# Patient Record
Sex: Male | Born: 1945 | Race: White | Hispanic: No | Marital: Married | Smoking: Never smoker
Health system: Southern US, Community
[De-identification: ages and names within clinical notes are randomized; demographics above are authoritative.]

## PROBLEM LIST (undated history)

## (undated) DIAGNOSIS — H903 Sensorineural hearing loss, bilateral: Secondary | ICD-10-CM

## (undated) DIAGNOSIS — M48061 Spinal stenosis, lumbar region without neurogenic claudication: Secondary | ICD-10-CM

## (undated) DIAGNOSIS — C44320 Squamous cell carcinoma of skin of unspecified parts of face: Secondary | ICD-10-CM

## (undated) DIAGNOSIS — Z5189 Encounter for other specified aftercare: Secondary | ICD-10-CM

## (undated) DIAGNOSIS — M4802 Spinal stenosis, cervical region: Secondary | ICD-10-CM

## (undated) DIAGNOSIS — M5412 Radiculopathy, cervical region: Secondary | ICD-10-CM

## (undated) HISTORY — PX: SHOULDER SURGERY: SHX246

## (undated) HISTORY — PX: CERVICAL DISC SURGERY: SHX588

## (undated) HISTORY — DX: Radiculopathy, cervical region: M54.12

## (undated) HISTORY — DX: Sensorineural hearing loss, bilateral: H90.3

## (undated) HISTORY — DX: Encounter for other specified aftercare: Z51.89

## (undated) HISTORY — DX: Squamous cell carcinoma of skin of unspecified parts of face: C44.320

## (undated) HISTORY — PX: LUMBAR SPINE SURGERY: SHX701

## (undated) HISTORY — DX: Spinal stenosis, lumbar region without neurogenic claudication: M48.061

## (undated) HISTORY — PX: COLONOSCOPY: SHX174

## (undated) HISTORY — PX: OTHER SURGICAL HISTORY: SHX169

## (undated) HISTORY — PX: ANKLE SURGERY: SHX546

## (undated) HISTORY — DX: Spinal stenosis, cervical region: M48.02

---

## 2000-10-24 ENCOUNTER — Encounter: Admission: RE | Admit: 2000-10-24 | Discharge: 2000-10-24 | Payer: Self-pay | Admitting: Sports Medicine

## 2001-03-09 ENCOUNTER — Encounter: Admission: RE | Admit: 2001-03-09 | Discharge: 2001-03-09 | Payer: Self-pay | Admitting: Family Medicine

## 2001-03-31 ENCOUNTER — Encounter: Admission: RE | Admit: 2001-03-31 | Discharge: 2001-04-27 | Payer: Self-pay | Admitting: Sports Medicine

## 2005-06-07 ENCOUNTER — Ambulatory Visit: Payer: Self-pay | Admitting: Internal Medicine

## 2005-08-02 ENCOUNTER — Ambulatory Visit: Payer: Self-pay | Admitting: Internal Medicine

## 2005-08-13 ENCOUNTER — Ambulatory Visit: Payer: Self-pay | Admitting: Gastroenterology

## 2005-09-06 ENCOUNTER — Ambulatory Visit: Payer: Self-pay | Admitting: Gastroenterology

## 2005-09-06 ENCOUNTER — Encounter: Payer: Self-pay | Admitting: Internal Medicine

## 2007-08-21 ENCOUNTER — Encounter: Payer: Self-pay | Admitting: Internal Medicine

## 2007-11-19 ENCOUNTER — Encounter: Payer: Self-pay | Admitting: Internal Medicine

## 2008-03-08 ENCOUNTER — Ambulatory Visit: Payer: Self-pay | Admitting: Internal Medicine

## 2008-03-10 ENCOUNTER — Ambulatory Visit: Payer: Self-pay | Admitting: Internal Medicine

## 2008-03-10 LAB — CONVERTED CEMR LAB
Bilirubin Urine: NEGATIVE
Blood in Urine, dipstick: NEGATIVE
Glucose, Urine, Semiquant: NEGATIVE
Ketones, urine, test strip: NEGATIVE
Nitrite: NEGATIVE
Protein, U semiquant: 30
Specific Gravity, Urine: >=1.03
Urobilinogen, UA: NEGATIVE
WBC Urine, dipstick: NEGATIVE
pH: 5

## 2008-06-27 ENCOUNTER — Ambulatory Visit: Payer: Self-pay | Admitting: Internal Medicine

## 2008-10-27 ENCOUNTER — Ambulatory Visit: Payer: Self-pay | Admitting: Internal Medicine

## 2008-10-27 DIAGNOSIS — M542 Cervicalgia: Secondary | ICD-10-CM | POA: Insufficient documentation

## 2009-09-06 ENCOUNTER — Ambulatory Visit: Payer: Self-pay | Admitting: Internal Medicine

## 2009-09-06 LAB — CONVERTED CEMR LAB
ALT: 36 units/L (ref 0–53)
AST: 35 units/L (ref 0–37)
Albumin: 4.2 g/dL (ref 3.5–5.2)
Alkaline Phosphatase: 53 units/L (ref 39–117)
BUN: 14 mg/dL (ref 6–23)
Basophils Absolute: 0 10*3/uL (ref 0.0–0.1)
Basophils Relative: 0.2 % (ref 0.0–3.0)
Bilirubin Urine: NEGATIVE
Bilirubin, Direct: 0.1 mg/dL (ref 0.0–0.3)
Blood in Urine, dipstick: NEGATIVE
CO2: 31 meq/L (ref 19–32)
Calcium: 9.3 mg/dL (ref 8.4–10.5)
Chloride: 106 meq/L (ref 96–112)
Cholesterol: 198 mg/dL (ref 0–200)
Creatinine, Ser: 1.2 mg/dL (ref 0.4–1.5)
Eosinophils Absolute: 0.1 10*3/uL (ref 0.0–0.7)
Eosinophils Relative: 2 % (ref 0.0–5.0)
GFR calc non Af Amer: 64.83 mL/min (ref >60)
Glucose, Bld: 100 mg/dL — ABNORMAL HIGH (ref 70–99)
Glucose, Urine, Semiquant: NEGATIVE
HCT: 44.5 % (ref 39.0–52.0)
HDL: 62 mg/dL (ref >39.00)
Hemoglobin: 15.3 g/dL (ref 13.0–17.0)
Ketones, urine, test strip: NEGATIVE
LDL Cholesterol: 122 mg/dL — ABNORMAL HIGH (ref 0–99)
Lymphocytes Relative: 23.7 % (ref 12.0–46.0)
Lymphs Abs: 1.3 10*3/uL (ref 0.7–4.0)
MCHC: 34.3 g/dL (ref 30.0–36.0)
MCV: 96.2 fL (ref 78.0–100.0)
Monocytes Absolute: 0.6 10*3/uL (ref 0.1–1.0)
Monocytes Relative: 11.4 % (ref 3.0–12.0)
Neutro Abs: 3.4 10*3/uL (ref 1.4–7.7)
Neutrophils Relative %: 62.7 % (ref 43.0–77.0)
Nitrite: NEGATIVE
PSA: 0.31 ng/mL (ref 0.10–4.00)
Platelets: 169 10*3/uL (ref 150.0–400.0)
Potassium: 4.7 meq/L (ref 3.5–5.1)
Protein, U semiquant: NEGATIVE
RBC: 4.63 M/uL (ref 4.22–5.81)
RDW: 12.9 % (ref 11.5–14.6)
Sodium: 143 meq/L (ref 135–145)
Specific Gravity, Urine: 1.02
TSH: 0.97 microintl units/mL (ref 0.35–5.50)
Total Bilirubin: 1.1 mg/dL (ref 0.3–1.2)
Total CHOL/HDL Ratio: 3
Total Protein: 7.3 g/dL (ref 6.0–8.3)
Triglycerides: 70 mg/dL (ref 0.0–149.0)
Urobilinogen, UA: 0.2
VLDL: 14 mg/dL (ref 0.0–40.0)
WBC Urine, dipstick: NEGATIVE
WBC: 5.4 10*3/uL (ref 4.5–10.5)
pH: 5.5

## 2009-10-27 ENCOUNTER — Ambulatory Visit: Payer: Self-pay | Admitting: Internal Medicine

## 2010-07-30 ENCOUNTER — Ambulatory Visit: Payer: Self-pay | Admitting: Internal Medicine

## 2010-07-30 DIAGNOSIS — J069 Acute upper respiratory infection, unspecified: Secondary | ICD-10-CM | POA: Insufficient documentation

## 2010-07-30 LAB — CONVERTED CEMR LAB
Bilirubin Urine: NEGATIVE
Blood in Urine, dipstick: NEGATIVE
Glucose, Urine, Semiquant: NEGATIVE
Ketones, urine, test strip: NEGATIVE
Nitrite: NEGATIVE
Protein, U semiquant: NEGATIVE
Specific Gravity, Urine: 1.01
Urobilinogen, UA: 0.2
WBC Urine, dipstick: NEGATIVE
pH: 6.5

## 2010-10-30 NOTE — Procedures (Signed)
Summary: colonoscopy  colonoscopy   Imported By: Kassie Mends 03/09/2008 16:34:30  _____________________________________________________________________  External Attachment:    Type:   Image     Comment:   colonoscopy

## 2010-10-30 NOTE — Miscellaneous (Signed)
  Clinical Lists Changes  Medications: Changed medication from PROPECIA 1 MG TABS (FINASTERIDE) to PROPECIA 1 MG TABS (FINASTERIDE) 1 once daily

## 2010-10-30 NOTE — Assessment & Plan Note (Signed)
Summary: cpx//ccm/pt rsc/cjr   Vital Signs:  Patient profile:   65 year old male Weight:      150 pounds BP sitting:   98 / 60  (left arm) Cuff size:   regular  Vitals Entered By: Raechel Ache, RN (October 27, 2009 9:36 AM) CC: CPX, labs done. Is Patient Diabetic? No   CC:  CPX and labs done.Marland Kitchen  History of Present Illness: 65 year old patient who is seen today for a wellness exam.  Complaints include some mild paresthesias involving his left neck and shoulder area.  Symptoms are aggravated by certain head positions.  He has been doing some gentle stretching and range of motion exercises and symptoms have improved.  There is been no significant pain or motor weakness. He also has noticed an occasional bright red rectal bleeding for the past month.  Preventive Screening-Counseling & Management  Caffeine-Diet-Exercise     Does Patient Exercise: yes  Allergies: No Known Drug Allergies  Past History:  Past Medical History: cervical radiculopathy  Past Surgical History: Reviewed history from 03/08/2008 and no changes required. ENT surgery for nasal polyps, 1998 right ankle surgery in the 70s left shoulder surgery 1962   colonoscopy December 2006  Family History: Reviewed history from 03/08/2008 and no changes required. father died age 66, prostate cancer mother the mid 30s, complications of coronary artery disease, and congestive heart failure  One brother in good health  Social History: Reviewed history from 03/08/2008 and no changes required. Never Smoked Regular exercise-yes; runs 3 days/week Does Patient Exercise:  yes  Review of Systems  The patient denies anorexia, fever, weight loss, weight gain, vision loss, decreased hearing, hoarseness, chest pain, syncope, dyspnea on exertion, peripheral edema, prolonged cough, headaches, hemoptysis, abdominal pain, melena, hematochezia, severe indigestion/heartburn, hematuria, incontinence, genital sores, muscle  weakness, suspicious skin lesions, transient blindness, difficulty walking, depression, unusual weight change, abnormal bleeding, enlarged lymph nodes, angioedema, breast masses, and testicular masses.    Physical Exam  General:  Well-developed,well-nourished,in no acute distress; alert,appropriate and cooperative throughout examination; 100/64 Head:  Normocephalic and atraumatic without obvious abnormalities. No apparent alopecia or balding. Eyes:  No corneal or conjunctival inflammation noted. EOMI. Perrla. Funduscopic exam benign, without hemorrhages, exudates or papilledema. Vision grossly normal. Ears:  External ear exam shows no significant lesions or deformities.  Otoscopic examination reveals clear canals, tympanic membranes are intact bilaterally without bulging, retraction, inflammation or discharge. Hearing is grossly normal bilaterally. Nose:  External nasal examination shows no deformity or inflammation. Nasal mucosa are pink and moist without lesions or exudates. Mouth:  Oral mucosa and oropharynx without lesions or exudates.  Teeth in good repair. Neck:  No deformities, masses, or tenderness noted. Chest Wall:  No deformities, masses, tenderness or gynecomastia noted. Breasts:  No masses or gynecomastia noted Lungs:  Normal respiratory effort, chest expands symmetrically. Lungs are clear to auscultation, no crackles or wheezes. Heart:  Normal rate and regular rhythm. S1 and S2 normal without gallop, murmur, click, rub or other extra sounds. Abdomen:  Bowel sounds positive,abdomen soft and non-tender without masses, organomegaly or hernias noted. Rectal:  No external abnormalities noted. Normal sphincter tone. No rectal masses or tenderness. patient had a superficial fissure just above the anal area Genitalia:  Testes bilaterally descended without nodularity, tenderness or masses. No scrotal masses or lesions. No penis lesions or urethral discharge. Prostate:  Prostate gland firm and  smooth, no enlargement, nodularity, tenderness, mass, asymmetry or induration. Msk:  No deformity or scoliosis noted of thoracic or  lumbar spine.   Pulses:  the right dorsalis pedis pulse diminished Extremities:  No clubbing, cyanosis, edema, or deformity noted with normal full range of motion of all joints.   Neurologic:  No cranial nerve deficits noted. Station and gait are normal. Plantar reflexes are down-going bilaterally. DTRs are symmetrical throughout. Sensory, motor and coordinative functions appear intact. Skin:  Intact without suspicious lesions or rashes Cervical Nodes:  No lymphadenopathy noted Axillary Nodes:  No palpable lymphadenopathy Inguinal Nodes:  No significant adenopathy Psych:  Cognition and judgment appear intact. Alert and cooperative with normal attention span and concentration. No apparent delusions, illusions, hallucinations   Impression & Recommendations:  Problem # 1:  NECK PAIN (ICD-723.1)  The following medications were removed from the medication list:    Diclofenac Sodium 75 Mg Tbec (Diclofenac sodium) ..... One twice daily His updated medication list for this problem includes:    Adult Aspirin Ec Low Strength 81 Mg Tbec (Aspirin) .Marland Kitchen... 1 once daily  The following medications were removed from the medication list:    Diclofenac Sodium 75 Mg Tbec (Diclofenac sodium) ..... One twice daily His updated medication list for this problem includes:    Adult Aspirin Ec Low Strength 81 Mg Tbec (Aspirin) .Marland Kitchen... 1 once daily  Problem # 2:  Preventive Health Care (ICD-V70.0)  Complete Medication List: 1)  Propecia 1 Mg Tabs (Finasteride) .Marland Kitchen.. 1 once daily 2)  Adult Aspirin Ec Low Strength 81 Mg Tbec (Aspirin) .Marland Kitchen.. 1 once daily  Other Orders: TD Toxoids IM 7 YR + (62130) Admin 1st Vaccine (86578)  Patient Instructions: 1)  Please schedule a follow-up appointment in 1 year. 2)  It is important that you exercise regularly at least 20 minutes 5 times a week. If  you develop chest pain, have severe difficulty breathing, or feel very tired , stop exercising immediately and seek medical attention. Prescriptions: PROPECIA 1 MG TABS (FINASTERIDE) 1 once daily  #90 x 5   Entered and Authorized by:   Gordy Savers  MD   Signed by:   Gordy Savers  MD on 10/27/2009   Method used:   Electronically to        [facility]. [phone]* (retail)       [address]       [place], [place]  [address]       Ph: [phone]       Fax: [phone]   RxID:   [id number]    Immunizations Administered:  Tetanus Vaccine:    Vaccine Type: Td    Site: right deltoid    Mfr: Sanofi Pasteur    Dose: 0.5 ml    Route: IM    Given by: Raechel Ache, RN    Exp. Date: 01/11/2011    Lot #: [id number]    VIS given: 08/18/07 version given October 27, 2009.

## 2010-10-30 NOTE — Consult Note (Signed)
Summary: [facility]   Imported By: Kassie Mends 12/03/2007 08:35:21  _____________________________________________________________________  External Attachment:    Type:   Image     Comment:   [facility]

## 2010-10-30 NOTE — Assessment & Plan Note (Signed)
Summary: LG FEVER, HEAD CONGESTION // RS   Vital Signs:  Patient profile:   65 year old male Height:      68 inches Weight:      147 pounds BMI:     22.43 Temp:     97.9 degrees F oral BP sitting:   118 / 70  (left arm) Cuff size:   regular  Vitals Entered By: Alfred Levins, CMA (July 30, 2010 11:40 AM) CC: head stopped up, low back pain, st, laryngitis x6 days   CC:  head stopped up, low back pain, st, and laryngitis x6 days.  History of Present Illness: 65 year old patient who presents with a 4 to 5 day history congestion is cough, lower back pain and mild sore throat.  No fever, purulent sputum production.  Denies any chest pain or shortness of breath.  He has been using OTC medications without much benefit  Allergies: No Known Drug Allergies  Past History:  Past Medical History: Reviewed history from 10/27/2009 and no changes required. cervical radiculopathy  Review of Systems       The patient complains of anorexia, fever, and prolonged cough.  The patient denies weight loss, weight gain, vision loss, decreased hearing, hoarseness, chest pain, syncope, dyspnea on exertion, peripheral edema, headaches, hemoptysis, abdominal pain, melena, hematochezia, severe indigestion/heartburn, hematuria, incontinence, genital sores, muscle weakness, suspicious skin lesions, transient blindness, difficulty walking, depression, unusual weight change, abnormal bleeding, enlarged lymph nodes, angioedema, breast masses, and testicular masses.    Physical Exam  General:  Well-developed,well-nourished,in no acute distress; alert,appropriate and cooperative throughout examination Head:  Normocephalic and atraumatic without obvious abnormalities. No apparent alopecia or balding. Eyes:  No corneal or conjunctival inflammation noted. EOMI. Perrla. Funduscopic exam benign, without hemorrhages, exudates or papilledema. Vision grossly normal. Ears:  External ear exam shows no significant lesions  or deformities.  Otoscopic examination reveals clear canals, tympanic membranes are intact bilaterally without bulging, retraction, inflammation or discharge. Hearing is grossly normal bilaterally. Mouth:  pharyngeal erythema.   Neck:  No deformities, masses, or tenderness noted. Lungs:  Normal respiratory effort, chest expands symmetrically. Lungs are clear to auscultation, no crackles or wheezes. Heart:  Normal rate and regular rhythm. S1 and S2 normal without gallop, murmur, click, rub or other extra sounds.   Impression & Recommendations:  Problem # 1:  URI (ICD-465.9)  His updated medication list for this problem includes:    Adult Aspirin Ec Low Strength 81 Mg Tbec (Aspirin) .Marland Kitchen... 1 once daily  Complete Medication List: 1)  Propecia 1 Mg Tabs (Finasteride) .Marland Kitchen.. 1 once daily 2)  Adult Aspirin Ec Low Strength 81 Mg Tbec (Aspirin) .Marland Kitchen.. 1 once daily  Other Orders: UA Dipstick w/o Micro (manual) (86578)  Patient Instructions: 1)  Get plenty of rest, drink lots of clear liquids, and use Tylenol or Ibuprofen for fever and comfort. Return in 7-10 days if you're not better:sooner if you're feeling worse. 2)  Delsym one tsp twice daily 3)  Allegra D one twice daily  4)  Omnaris use twice daily will go in and an eye on him and if he were to and anywhere [place], the on a for a company of her K. if his vertigo is in no I. L. the  Orders Added: 1)  UA Dipstick w/o Micro (manual) [81002] 2)  Est. Patient Level III [46962]    Laboratory Results   Urine Tests  Date/Time Received: July 30, 2010 11:41 AM Date/Time Reported: July 30, 2010 11:41 AM  Routine Urinalysis   Color: yellow Appearance: Clear Glucose: negative   (Normal Range: Negative) Bilirubin: negative   (Normal Range: Negative) Ketone: negative   (Normal Range: Negative) Spec. Gravity: 1.010   (Normal Range: 1.003-1.035) Blood: negative   (Normal Range: Negative) pH: 6.5   (Normal Range: 5.0-8.0) Protein:  negative   (Normal Range: Negative) Urobilinogen: 0.2   (Normal Range: 0-1) Nitrite: negative   (Normal Range: Negative) Leukocyte Esterace: negative   (Normal Range: Negative)    Comments: Alfred Levins, CMA  July 30, 2010 11:47 AM

## 2010-10-30 NOTE — Assessment & Plan Note (Signed)
Summary: continued abdominal pain and "black tongue"/dm   Vital Signs:  Patient Profile:   65 Years Old Male Weight:      159 pounds Temp:     98.2 degrees F oral BP sitting:   86 / 52  (left arm) Cuff size:   regular  Vitals Entered By: Raechel Ache, RN (March 10, 2008 2:39 PM)                 Chief Complaint:  No better- black tongue this morning and peri-umbilical pain. No appetite. Urine dark yellow. Supposed to catch a plane tomorrow..  History of Present Illness: 65 year old gentleman seen today in follow-up.  He was seen two days ago with a suspected acute febrile illness related to a viral illness.  He has improved somewhat with less fever and myalgias.  He is still having some mild periumbilical pain and poor appetite, but no nausea, vomiting, or change in his bowel habits.  He is noted is earned more concentrated.  When he awoke this morning.  His tongue was described as black.  After brushing his teeth.  The tongue coating seemed to resolve.  At the present time.  He complains only of anorexia and some mild abdominal pain    Current Allergies: No known allergies       Physical Exam  General:     Well-developed,well-nourished,in no acute distress; alert,appropriate and cooperative throughout examination Head:     Normocephalic and atraumatic without obvious abnormalities. No apparent alopecia or balding. Eyes:     No corneal or conjunctival inflammation noted. EOMI. Perrla. Funduscopic exam benign, without hemorrhages, exudates or papilledema. Vision grossly normal.  anicteric Ears:     External ear exam shows no significant lesions or deformities.  Otoscopic examination reveals clear canals, tympanic membranes are intact bilaterally without bulging, retraction, inflammation or discharge. Hearing is grossly normal bilaterally. Nose:     External nasal examination shows no deformity or inflammation. Nasal mucosa are pink and moist without lesions or  exudates. Mouth:     Oral mucosa and oropharynx without lesions or exudates.  Teeth in good repair. tongue  appeared normal Neck:     No deformities, masses, or tenderness noted. Lungs:     Normal respiratory effort, chest expands symmetrically. Lungs are clear to auscultation, no crackles or wheezes. Heart:     Normal rate and regular rhythm. S1 and S2 normal without gallop, murmur, click, rub or other extra sounds. Abdomen:     mild periumbilical tenderness no guarding or rebound.  There is no right lower quadrant tenderness.  Bowel sounds are active    Impression & Recommendations:  Problem # 1:  FEVER (ICD-780.60)  Orders: UA Dipstick w/o Micro (manual) (11914) fever has resolved, and the patient appears clinically improved.  Unless he worsens, will  simply observe  and withhold any diagnostic studies  Complete Medication List: 1)  Propecia 1 Mg Tabs (Finasteride) .Marland Kitchen.. 1 once daily 2)  Adult Aspirin Ec Low Strength 81 Mg Tbec (Aspirin) .Marland Kitchen.. 1 once daily   Patient Instructions: 1)  Please schedule a follow-up appointment as needed. 2)  Drink as much fluid as you can tolerate for the next few days.   ] Laboratory Results   Urine Tests  Date/Time Recieved: 03/10/08  Routine Urinalysis   Color: yellow Appearance: Clear Glucose: negative   (Normal Range: Negative) Bilirubin: negative   (Normal Range: Negative) Ketone: negative   (Normal Range: Negative) Spec. Gravity: >=1.030   (Normal Range:  1.003-1.035) Blood: negative   (Normal Range: Negative) pH: 5.0   (Normal Range: 5.0-8.0) Protein: 30   (Normal Range: Negative) Urobilinogen: negative   (Normal Range: 0-1) Nitrite: negative   (Normal Range: Negative) Leukocyte Esterace: negative   (Normal Range: Negative)

## 2010-10-31 ENCOUNTER — Telehealth: Payer: Self-pay | Admitting: Internal Medicine

## 2010-10-31 NOTE — Telephone Encounter (Signed)
Attempt to call pt - VM - LMTCB with info he may have - we are unaware of any generic or alternative that is avilb.    KIK

## 2010-10-31 NOTE — Telephone Encounter (Signed)
Spoke with [facility] - no genric avilb.  Spoke with pt - requesting alternative med if avilb.

## 2010-10-31 NOTE — Telephone Encounter (Signed)
There are no generics, nor alternatives for this request

## 2010-10-31 NOTE — Telephone Encounter (Signed)
Pt req generic equivalent for Propecia. [facility] is sending refill req, but pls send generic.

## 2010-10-31 NOTE — Telephone Encounter (Signed)
OK 

## 2011-09-22 ENCOUNTER — Other Ambulatory Visit: Payer: Self-pay | Admitting: Internal Medicine

## 2011-10-31 ENCOUNTER — Telehealth: Payer: Self-pay | Admitting: Internal Medicine

## 2011-10-31 NOTE — Telephone Encounter (Signed)
Called pharmacy and spoke with Shon Hale who states pt usually picks up 30 tablets at a time and pt's prescription is at the pharmacy and will be ready for pickup.  Rx was sent on 09/22/11 #90.  Left a message on pt's vm to verify rx ready.

## 2011-10-31 NOTE — Telephone Encounter (Signed)
Pt req refill of Generic PROPECIA 1 MG tablet to [facility] on [address]. Pt said that the pharmacy has been sending refill requests for this med for a wk. Pls call in today.

## 2012-01-01 ENCOUNTER — Other Ambulatory Visit: Payer: Self-pay | Admitting: Internal Medicine

## 2012-01-01 MED ORDER — FINASTERIDE 1 MG PO TABS: 1.0000 mg | ORAL_TABLET | Freq: Every day | ORAL | Status: DC

## 2012-01-01 NOTE — Telephone Encounter (Signed)
Patient called stating that he need a refill of his finasteride called into [facility]. Please assist.

## 2012-01-01 NOTE — Telephone Encounter (Signed)
[facility] - # [facility] - will need to be seen for future refills- last seen 06/2010

## 2012-01-06 ENCOUNTER — Encounter: Payer: Self-pay | Admitting: Internal Medicine

## 2012-01-06 ENCOUNTER — Ambulatory Visit (INDEPENDENT_AMBULATORY_CARE_PROVIDER_SITE_OTHER): Payer: Medicare Other | Admitting: Internal Medicine

## 2012-01-06 VITALS — BP 110/80 | HR 74 | Temp 98.7°F | Resp 16 | Ht 68.5 in | Wt 155.0 lb

## 2012-01-06 DIAGNOSIS — M542 Cervicalgia: Secondary | ICD-10-CM

## 2012-01-06 MED ORDER — FINASTERIDE 1 MG PO TABS: 1.0000 mg | ORAL_TABLET | Freq: Every day | ORAL | Status: DC

## 2012-01-06 NOTE — Progress Notes (Signed)
  Subjective:    Patient ID: Edward Berger, male    DOB: 10-Jun-1946, 66 y.o.   MRN: [id number]  HPI  66 year old patient who is seen today for followup. He does remarkably well and has not been seen here in well over one year he uses Propecia otherwise no chronic medications. He jogs 40 minutes 3-4 times per week. No major concerns or complaints except for his chronic left posterior neck pain with some paresthesias involving the left upper back and shoulder region. This has been chronic for some time and has not worsened. Symptoms are controlled with anti-inflammatory medication    Review of Systems  Constitutional: Negative for fever, chills, appetite change and fatigue.  HENT: Negative for hearing loss, ear pain, congestion, sore throat, trouble swallowing, neck stiffness, dental problem, voice change and tinnitus.   Eyes: Negative for pain, discharge and visual disturbance.  Respiratory: Negative for cough, chest tightness, wheezing and stridor.   Cardiovascular: Negative for chest pain, palpitations and leg swelling.  Gastrointestinal: Negative for nausea, vomiting, abdominal pain, diarrhea, constipation, blood in stool and abdominal distention.  Genitourinary: Negative for urgency, hematuria, flank pain, discharge, difficulty urinating and genital sores.  Musculoskeletal: Negative for myalgias, back pain, joint swelling, arthralgias and gait problem.  Skin: Negative for rash.  Neurological: Positive for numbness. Negative for dizziness, syncope, speech difficulty, weakness and headaches.  Hematological: Negative for adenopathy. Does not bruise/bleed easily.  Psychiatric/Behavioral: Negative for behavioral problems and dysphoric mood. The patient is not nervous/anxious.        Objective:   Physical Exam  Constitutional: He is oriented to person, place, and time. He appears well-developed.  HENT:  Head: Normocephalic.  Right Ear: External ear normal.  Left Ear: External ear normal.    Eyes: Conjunctivae and EOM are normal.  Neck: Normal range of motion.  Cardiovascular: Normal rate and normal heart sounds.   Pulmonary/Chest: Breath sounds normal.  Abdominal: Bowel sounds are normal.  Musculoskeletal: Normal range of motion. He exhibits no edema and no tenderness.  Neurological: He is alert and oriented to person, place, and time.  Psychiatric: He has a normal mood and affect. His behavior is normal.          Assessment & Plan:   Chronic neck pain with mild radiculopathy. We'll clinically observe at the present time he will continue to take when necessary anti-inflammatory medications if symptoms intensify we'll consider physical therapy or possible neck MRI We'll see in one year for a physical

## 2012-01-06 NOTE — Patient Instructions (Signed)
It is important that you exercise regularly, at least 20 minutes 3 to 4 times per week.  If you develop chest pain or shortness of breath seek  medical attention.  Annual  exam in one year

## 2012-03-04 ENCOUNTER — Other Ambulatory Visit: Payer: Self-pay | Admitting: Internal Medicine

## 2012-09-15 ENCOUNTER — Other Ambulatory Visit: Payer: Self-pay | Admitting: Internal Medicine

## 2013-03-09 ENCOUNTER — Other Ambulatory Visit (INDEPENDENT_AMBULATORY_CARE_PROVIDER_SITE_OTHER): Payer: Medicare Other

## 2013-03-09 DIAGNOSIS — Z0000 Encounter for general adult medical examination without abnormal findings: Secondary | ICD-10-CM

## 2013-03-09 LAB — PSA: PSA: 0.26 ng/mL (ref 0.10–4.00)

## 2013-03-09 LAB — CBC WITH DIFFERENTIAL/PLATELET
Basophils Absolute: 0 10*3/uL (ref 0.0–0.1)
Basophils Relative: 0.2 % (ref 0.0–3.0)
Eosinophils Absolute: 0.1 10*3/uL (ref 0.0–0.7)
Eosinophils Relative: 2.1 % (ref 0.0–5.0)
HCT: 44.8 % (ref 39.0–52.0)
Hemoglobin: 15.2 g/dL (ref 13.0–17.0)
Lymphocytes Relative: 17.2 % (ref 12.0–46.0)
Lymphs Abs: 1.1 10*3/uL (ref 0.7–4.0)
MCHC: 33.9 g/dL (ref 30.0–36.0)
MCV: 95.3 fl (ref 78.0–100.0)
Monocytes Absolute: 0.7 10*3/uL (ref 0.1–1.0)
Monocytes Relative: 10.5 % (ref 3.0–12.0)
Neutro Abs: 4.7 10*3/uL (ref 1.4–7.7)
Neutrophils Relative %: 70 % (ref 43.0–77.0)
Platelets: 172 10*3/uL (ref 150.0–400.0)
RBC: 4.7 Mil/uL (ref 4.22–5.81)
RDW: 13 % (ref 11.5–14.6)
WBC: 6.7 10*3/uL (ref 4.5–10.5)

## 2013-03-09 LAB — TSH: TSH: 0.91 u[IU]/mL (ref 0.35–5.50)

## 2013-03-09 LAB — BASIC METABOLIC PANEL
BUN: 18 mg/dL (ref 6–23)
CO2: 27 mEq/L (ref 19–32)
Calcium: 9.1 mg/dL (ref 8.4–10.5)
Chloride: 104 mEq/L (ref 96–112)
Creatinine, Ser: 1.2 mg/dL (ref 0.4–1.5)
GFR: 61.75 mL/min (ref >60.00)
Glucose, Bld: 93 mg/dL (ref 70–99)
Potassium: 4.8 mEq/L (ref 3.5–5.1)
Sodium: 140 mEq/L (ref 135–145)

## 2013-03-09 LAB — LIPID PANEL
Cholesterol: 192 mg/dL (ref 0–200)
HDL: 72.4 mg/dL (ref >39.00)
LDL Cholesterol: 110 mg/dL — ABNORMAL HIGH (ref 0–99)
Total CHOL/HDL Ratio: 3
Triglycerides: 50 mg/dL (ref 0.0–149.0)
VLDL: 10 mg/dL (ref 0.0–40.0)

## 2013-03-09 LAB — POCT URINALYSIS DIPSTICK
Bilirubin, UA: NEGATIVE
Blood, UA: NEGATIVE
Glucose, UA: NEGATIVE
Ketones, UA: NEGATIVE
Leukocytes, UA: NEGATIVE
Nitrite, UA: NEGATIVE
Protein, UA: NEGATIVE
Spec Grav, UA: 1.01
Urobilinogen, UA: 0.2
pH, UA: 6

## 2013-03-09 LAB — HEPATIC FUNCTION PANEL
ALT: 17 U/L (ref 0–53)
AST: 20 U/L (ref 0–37)
Albumin: 3.9 g/dL (ref 3.5–5.2)
Alkaline Phosphatase: 51 U/L (ref 39–117)
Bilirubin, Direct: 0.1 mg/dL (ref 0.0–0.3)
Total Bilirubin: 1 mg/dL (ref 0.3–1.2)
Total Protein: 6.7 g/dL (ref 6.0–8.3)

## 2013-03-17 ENCOUNTER — Ambulatory Visit (INDEPENDENT_AMBULATORY_CARE_PROVIDER_SITE_OTHER): Payer: Medicare Other | Admitting: Internal Medicine

## 2013-03-17 ENCOUNTER — Encounter: Payer: Self-pay | Admitting: Internal Medicine

## 2013-03-17 VITALS — BP 110/70 | HR 53 | Temp 98.5°F | Resp 20 | Ht 67.5 in | Wt 154.0 lb

## 2013-03-17 DIAGNOSIS — Z0000 Encounter for general adult medical examination without abnormal findings: Secondary | ICD-10-CM

## 2013-03-17 DIAGNOSIS — R001 Bradycardia, unspecified: Secondary | ICD-10-CM

## 2013-03-17 DIAGNOSIS — I498 Other specified cardiac arrhythmias: Secondary | ICD-10-CM

## 2013-03-17 DIAGNOSIS — M542 Cervicalgia: Secondary | ICD-10-CM

## 2013-03-17 NOTE — Progress Notes (Signed)
Patient ID: Edward Berger, male   DOB: [birth year], 67 y.o.   MRN: [id number]

## 2013-03-17 NOTE — Progress Notes (Signed)
Subjective:    Patient ID: Edward Berger, male    DOB: [birth year], 67 y.o.   MRN: [id number]  HPI CC: CPX and labs done.Marland Kitchen  History of Present Illness:   67 year-old patient who is seen today for a wellness exam. Complaints include some mild paresthesias involving his left neck and shoulder area. Symptoms are aggravated by certain head positions. He has been doing some gentle stretching and range of motion exercises and symptoms have improved. There is been no significant pain or motor weakness.   Preventive Screening-Counseling & Management  Caffeine-Diet-Exercise  Does Patient Exercise: yes  And Allergies:  No Known Drug Allergies   Past History:  Past Medical History:  cervical radiculopathy   Past Surgical History:  Reviewed history from 03/08/2008 and no changes required.  ENT surgery for nasal polyps, 1998  right ankle surgery in the 70s  left shoulder surgery 1962  colonoscopy December 2006   Family History:  Reviewed history from 03/08/2008 and no changes required.  father died age 82, prostate cancer  mother the mid 90s, complications of coronary artery disease, and congestive heart failure  One brother in good health   Social History:  Reviewed history from 03/08/2008 and no changes required.  Never Smoked  Regular exercise-yes; runs 3 days/week  Does Patient Exercise: yes  Past Medical History  Diagnosis Date  . Cervical radiculopathy, chronic     History   Social History  . Marital Status: Married    Spouse Name: N/A    Number of Children: N/A  . Years of Education: N/A   Occupational History  . Not on file.   Social History Main Topics  . Smoking status: Never Smoker   . Smokeless tobacco: Never Used  . Alcohol Use: Yes  . Drug Use: No  . Sexually Active: Not on file   Other Topics Concern  . Not on file   Social History Narrative  . No narrative on file    Past Surgical History  Procedure Laterality Date  . Ent    . Ankle surgery       right  . Shoulder surgery      left    Family History  Problem Relation Age of Onset  . Heart disease Mother   . Cancer Father     prostate    No Known Allergies  Current Outpatient Prescriptions on File Prior to Visit  Medication Sig Dispense Refill  . finasteride (PROPECIA) 1 MG tablet TAKE 1 TABLET BY MOUTH EVERY DAY  90 tablet  1   No current facility-administered medications on file prior to visit.    BP 110/70  Pulse 53  Temp(Src) 98.5 F (36.9 C) (Oral)  Resp 20  Ht 5' 7.5" (1.715 m)  Wt 154 lb (69.854 kg)  BMI 23.75 kg/m2  SpO2 97%   1. Risk factors, based on past  M,S,F history; no cardiovascular risk factors.-    2.  Physical activities: remains quite active. Jogs 3 or 4 times per week  3.  Depression/mood: no history depression or mood disorder  4.  Hearing: no deficits  5.  ADL's: independent in all aspects of daily living  6.  Fall risk: low no problems identified  7.  Home safety: no problems identified  8.  Height weight, and visual acuity; height and weight stable no change in visual acuity  9.  Counseling: heart healthy diet regular exercise all encouraged   10. Lab orders based on risk factors: laboratory profile  including lipid panel and PSA reviewed   11. Referral : followup dermatology on an annual basis   12. Care plan: continue present regimen with active lifestyle and heart healthy diet   13. Cognitive assessment: Alert and oriented with normal affect. No cognitive dysfunction       Review of Systems  Constitutional: Negative for fever, chills, activity change, appetite change and fatigue.  HENT: Negative for hearing loss, ear pain, congestion, rhinorrhea, sneezing, mouth sores, trouble swallowing, neck pain, neck stiffness, dental problem, voice change, sinus pressure and tinnitus.   Eyes: Negative for photophobia, pain, redness and visual disturbance.  Respiratory: Negative for apnea, cough, choking, chest tightness,  shortness of breath and wheezing.   Cardiovascular: Negative for chest pain, palpitations and leg swelling.  Gastrointestinal: Negative for nausea, vomiting, abdominal pain, diarrhea, constipation, blood in stool, abdominal distention, anal bleeding and rectal pain.  Genitourinary: Negative for dysuria, urgency, frequency, hematuria, flank pain, decreased urine volume, discharge, penile swelling, scrotal swelling, difficulty urinating, genital sores and testicular pain.  Musculoskeletal: Negative for myalgias, back pain, joint swelling, arthralgias and gait problem.  Skin: Negative for color change, rash and wound.  Neurological: Negative for dizziness, tremors, seizures, syncope, facial asymmetry, speech difficulty, weakness, light-headedness, numbness and headaches.  Hematological: Negative for adenopathy. Does not bruise/bleed easily.  Psychiatric/Behavioral: Negative for suicidal ideas, hallucinations, behavioral problems, confusion, sleep disturbance, self-injury, dysphoric mood, decreased concentration and agitation. The patient is not nervous/anxious.        Objective:   Physical Exam  Constitutional: He appears well-developed and well-nourished.  HENT:  Head: Normocephalic and atraumatic.  Right Ear: External ear normal.  Left Ear: External ear normal.  Nose: Nose normal.  Mouth/Throat: Oropharynx is clear and moist.  Eyes: Conjunctivae and EOM are normal. Pupils are equal, round, and reactive to light. No scleral icterus.  Neck: Normal range of motion. Neck supple. No JVD present. No thyromegaly present.  Cardiovascular: Regular rhythm, normal heart sounds and intact distal pulses.  Exam reveals no gallop and no friction rub.   No murmur heard. Pulmonary/Chest: Effort normal and breath sounds normal. He exhibits no tenderness.  Abdominal: Soft. Bowel sounds are normal. He exhibits no distension and no mass. There is no tenderness.  Genitourinary: Prostate normal and penis normal.   Musculoskeletal: Normal range of motion. He exhibits no edema and no tenderness.  Lymphadenopathy:    He has no cervical adenopathy.  Neurological: He is alert. He has normal reflexes. No cranial nerve deficit. Coordination normal.  Skin: Skin is warm and dry. No rash noted.  Psychiatric: He has a normal mood and affect. His behavior is normal.          Assessment & Plan:    preventive health examination Cervical osteoarthritis  We'll continue present active lifestyle. Patient is at ideal body weight. Laboratory studies reviewed lipid profile is low risk. Blood pressure is in a low-normal range Followup dermatology annually do to his pale complexion  Return here in one year or as needed

## 2013-03-17 NOTE — Patient Instructions (Signed)
Limit your sodium (Salt) intake    It is important that you exercise regularly, at least 20 minutes 3 to 4 times per week.  If you develop chest pain or shortness of breath seek  medical attention.  Return in one year for follow-up   

## 2013-03-30 ENCOUNTER — Other Ambulatory Visit: Payer: Self-pay | Admitting: Internal Medicine

## 2013-05-05 ENCOUNTER — Other Ambulatory Visit: Payer: Self-pay

## 2013-07-06 ENCOUNTER — Ambulatory Visit (INDEPENDENT_AMBULATORY_CARE_PROVIDER_SITE_OTHER): Payer: Medicare Other | Admitting: *Deleted

## 2013-07-06 DIAGNOSIS — Z23 Encounter for immunization: Secondary | ICD-10-CM

## 2013-07-26 ENCOUNTER — Encounter: Payer: Self-pay | Admitting: Internal Medicine

## 2013-07-26 ENCOUNTER — Ambulatory Visit (INDEPENDENT_AMBULATORY_CARE_PROVIDER_SITE_OTHER): Payer: Medicare Other | Admitting: Internal Medicine

## 2013-07-26 VITALS — BP 120/70 | HR 52 | Temp 97.7°F | Resp 20 | Wt 152.0 lb

## 2013-07-26 DIAGNOSIS — H6122 Impacted cerumen, left ear: Secondary | ICD-10-CM

## 2013-07-26 DIAGNOSIS — H6120 Impacted cerumen, unspecified ear: Secondary | ICD-10-CM

## 2013-07-26 NOTE — Progress Notes (Signed)
Subjective:    Patient ID: Edward Berger, male    DOB: January 26, 1946, 67 y.o.   MRN: [id number]  HPI  67 year old patient who is seen with the chief complaint of decreased auditory acuity. He has had difficulties with cerumen impactions in the past. No other concerns or complaints. No tinnitus symptoms seem intermittent.  Past Medical History  Diagnosis Date  . Cervical radiculopathy, chronic     History   Social History  . Marital Status: Married    Spouse Name: N/A    Number of Children: N/A  . Years of Education: N/A   Occupational History  . Not on file.   Social History Main Topics  . Smoking status: Never Smoker   . Smokeless tobacco: Never Used  . Alcohol Use: Yes  . Drug Use: No  . Sexual Activity: Not on file   Other Topics Concern  . Not on file   Social History Narrative  . No narrative on file    Past Surgical History  Procedure Laterality Date  . Ent    . Ankle surgery      right  . Shoulder surgery      left    Family History  Problem Relation Age of Onset  . Heart disease Mother   . Cancer Father     prostate    No Known Allergies  Current Outpatient Prescriptions on File Prior to Visit  Medication Sig Dispense Refill  . finasteride (PROPECIA) 1 MG tablet TAKE 1 TABLET BY MOUTH ONCE A DAY  90 tablet  1  . ibuprofen (ADVIL,MOTRIN) 200 MG tablet Take 400 mg by mouth every 6 (six) hours as needed for pain.      . Multiple Vitamin (MULTIVITAMIN) tablet Take 1 tablet by mouth daily.      . Omega-3 Fatty Acids (FISH OIL) 1000 MG CAPS Take 1 capsule by mouth daily.       No current facility-administered medications on file prior to visit.    BP 120/70  Pulse 52  Temp(Src) 97.7 F (36.5 C) (Oral)  Resp 20  Wt 152 lb (68.947 kg)  BMI 23.44 kg/m2  SpO2 98%       Review of Systems  Constitutional: Negative for fever, chills, appetite change and fatigue.  HENT: Positive for hearing loss. Negative for congestion, dental problem, ear pain,  sore throat, tinnitus, trouble swallowing and voice change.   Eyes: Negative for pain, discharge and visual disturbance.  Respiratory: Negative for cough, chest tightness, wheezing and stridor.   Cardiovascular: Negative for chest pain, palpitations and leg swelling.  Gastrointestinal: Negative for nausea, vomiting, abdominal pain, diarrhea, constipation, blood in stool and abdominal distention.  Genitourinary: Negative for urgency, hematuria, flank pain, discharge, difficulty urinating and genital sores.  Musculoskeletal: Negative for arthralgias, back pain, gait problem, joint swelling, myalgias and neck stiffness.  Skin: Negative for rash.  Neurological: Negative for dizziness, syncope, speech difficulty, weakness, numbness and headaches.  Hematological: Negative for adenopathy. Does not bruise/bleed easily.  Psychiatric/Behavioral: Negative for behavioral problems and dysphoric mood. The patient is not nervous/anxious.        Objective:   Physical Exam  Constitutional: He is oriented to person, place, and time. He appears well-developed and well-nourished. No distress.  HENT:  Head: Normocephalic.  Right Ear: External ear normal.  Cerumen impaction left ear canal  Eyes: Conjunctivae and EOM are normal.  Neck: Normal range of motion.  Cardiovascular: Normal rate and normal heart sounds.   Pulmonary/Chest: Breath sounds normal.  Abdominal: Bowel sounds are normal.  Musculoskeletal: Normal range of motion. He exhibits no edema and no tenderness.  Neurological: He is alert and oriented to person, place, and time.  Psychiatric: He has a normal mood and affect. His behavior is normal.          Assessment & Plan:  Cerumen impaction left canal. Canal irrigated until clear

## 2013-07-26 NOTE — Patient Instructions (Signed)
Call or return to clinic prn if these symptoms worsen or fail to improve as anticipated.

## 2013-07-29 DIAGNOSIS — Z23 Encounter for immunization: Secondary | ICD-10-CM

## 2013-10-06 ENCOUNTER — Other Ambulatory Visit: Payer: Self-pay | Admitting: Internal Medicine

## 2013-10-12 ENCOUNTER — Ambulatory Visit: Payer: Medicare Other | Admitting: *Deleted

## 2013-10-13 ENCOUNTER — Ambulatory Visit (INDEPENDENT_AMBULATORY_CARE_PROVIDER_SITE_OTHER): Payer: Medicare Other | Admitting: *Deleted

## 2013-10-13 DIAGNOSIS — Z23 Encounter for immunization: Secondary | ICD-10-CM

## 2013-10-13 DIAGNOSIS — Z2911 Encounter for prophylactic immunotherapy for respiratory syncytial virus (RSV): Secondary | ICD-10-CM

## 2014-04-28 ENCOUNTER — Other Ambulatory Visit: Payer: Self-pay | Admitting: Internal Medicine

## 2014-07-29 ENCOUNTER — Other Ambulatory Visit: Payer: Self-pay | Admitting: Internal Medicine

## 2014-08-01 ENCOUNTER — Telehealth: Payer: Self-pay | Admitting: Internal Medicine

## 2014-08-01 NOTE — Telephone Encounter (Signed)
Pt is requesting re-fill on finasteride (PROPECIA) 1 MG tablet sent to [facility] # [address] [place], [place].  Pt would like a callback and talk to you about the request for CPX.  He is scheduled for 10/06/14

## 2014-08-02 MED ORDER — FINASTERIDE 1 MG PO TABS: ORAL_TABLET | ORAL | Status: DC

## 2014-08-02 NOTE — Telephone Encounter (Signed)
Left detailed message Rx was sent to pharmacy and to keep physical appointment as scheduled.

## 2014-09-26 ENCOUNTER — Other Ambulatory Visit (INDEPENDENT_AMBULATORY_CARE_PROVIDER_SITE_OTHER): Payer: Medicare Other

## 2014-09-26 DIAGNOSIS — Z0000 Encounter for general adult medical examination without abnormal findings: Secondary | ICD-10-CM

## 2014-09-26 LAB — POCT URINALYSIS DIPSTICK
Bilirubin, UA: NEGATIVE
Blood, UA: NEGATIVE
Glucose, UA: NEGATIVE
Ketones, UA: NEGATIVE
Leukocytes, UA: NEGATIVE
Nitrite, UA: NEGATIVE
Protein, UA: NEGATIVE
Spec Grav, UA: 1.015
Urobilinogen, UA: 0.2
pH, UA: 5

## 2014-09-26 LAB — LIPID PANEL
Cholesterol: 200 mg/dL (ref 0–200)
HDL: 67 mg/dL (ref >39.00)
LDL Cholesterol: 117 mg/dL — ABNORMAL HIGH (ref 0–99)
NonHDL: 133
Total CHOL/HDL Ratio: 3
Triglycerides: 80 mg/dL (ref 0.0–149.0)
VLDL: 16 mg/dL (ref 0.0–40.0)

## 2014-09-26 LAB — CBC WITH DIFFERENTIAL/PLATELET
Basophils Absolute: 0 10*3/uL (ref 0.0–0.1)
Basophils Relative: 0.4 % (ref 0.0–3.0)
Eosinophils Absolute: 0.2 10*3/uL (ref 0.0–0.7)
Eosinophils Relative: 2.7 % (ref 0.0–5.0)
HCT: 44.5 % (ref 39.0–52.0)
Hemoglobin: 14.8 g/dL (ref 13.0–17.0)
Lymphocytes Relative: 18.5 % (ref 12.0–46.0)
Lymphs Abs: 1.5 10*3/uL (ref 0.7–4.0)
MCHC: 33.2 g/dL (ref 30.0–36.0)
MCV: 94.5 fl (ref 78.0–100.0)
Monocytes Absolute: 0.7 10*3/uL (ref 0.1–1.0)
Monocytes Relative: 9.1 % (ref 3.0–12.0)
Neutro Abs: 5.7 10*3/uL (ref 1.4–7.7)
Neutrophils Relative %: 69.3 % (ref 43.0–77.0)
Platelets: 216 10*3/uL (ref 150.0–400.0)
RBC: 4.71 Mil/uL (ref 4.22–5.81)
RDW: 13.2 % (ref 11.5–15.5)
WBC: 8.2 10*3/uL (ref 4.0–10.5)

## 2014-09-26 LAB — COMPREHENSIVE METABOLIC PANEL
ALT: 22 U/L (ref 0–53)
AST: 22 U/L (ref 0–37)
Albumin: 3.8 g/dL (ref 3.5–5.2)
Alkaline Phosphatase: 57 U/L (ref 39–117)
BUN: 21 mg/dL (ref 6–23)
CO2: 32 mEq/L (ref 19–32)
Calcium: 9.2 mg/dL (ref 8.4–10.5)
Chloride: 105 mEq/L (ref 96–112)
Creatinine, Ser: 1.1 mg/dL (ref 0.4–1.5)
GFR: 67.73 mL/min (ref >60.00)
Glucose, Bld: 103 mg/dL — ABNORMAL HIGH (ref 70–99)
Potassium: 4.4 mEq/L (ref 3.5–5.1)
Sodium: 142 mEq/L (ref 135–145)
Total Bilirubin: 0.5 mg/dL (ref 0.2–1.2)
Total Protein: 6.6 g/dL (ref 6.0–8.3)

## 2014-09-26 LAB — TSH: TSH: 1.07 u[IU]/mL (ref 0.35–4.50)

## 2014-09-26 LAB — PSA: PSA: 0.27 ng/mL (ref 0.10–4.00)

## 2014-10-06 ENCOUNTER — Ambulatory Visit (INDEPENDENT_AMBULATORY_CARE_PROVIDER_SITE_OTHER): Payer: Medicare Other | Admitting: Internal Medicine

## 2014-10-06 ENCOUNTER — Encounter: Payer: Self-pay | Admitting: Internal Medicine

## 2014-10-06 ENCOUNTER — Ambulatory Visit (INDEPENDENT_AMBULATORY_CARE_PROVIDER_SITE_OTHER): Payer: Medicare Other | Admitting: *Deleted

## 2014-10-06 DIAGNOSIS — Z23 Encounter for immunization: Secondary | ICD-10-CM

## 2014-10-06 DIAGNOSIS — Z0000 Encounter for general adult medical examination without abnormal findings: Secondary | ICD-10-CM

## 2014-10-06 DIAGNOSIS — M542 Cervicalgia: Secondary | ICD-10-CM

## 2014-10-06 NOTE — Addendum Note (Signed)
Addended by: Marian Sorrow on: 10/06/2014 02:38 PM   Modules accepted: Orders

## 2014-10-06 NOTE — Progress Notes (Signed)
Subjective:    Patient ID: Edward Berger, male    DOB: Jun 02, 1946, 69 y.o.   MRN: [id number]  HPI  CC: CPX and labs done.Marland Kitchen  History of Present Illness:   69 year-old patient who is seen today for a wellness exam.    Preventive Screening-Counseling & Management  Caffeine-Diet-Exercise  Does Patient Exercise: yes   Allergies:  No Known Drug Allergies   Past History:  Past Medical History:  cervical radiculopathy   Past Surgical History:   ENT surgery for nasal polyps, 1998  right ankle surgery in the 70s  left shoulder surgery 1962  colonoscopy December 2006   Family History:   father died age 46, prostate cancer  mother the mid 98P, complications of coronary artery disease, and congestive heart failure  One brother in good health   Social History:   Never Smoked  Regular exercise-yes; runs 3 days/week  Does Patient Exercise: yes  Past Medical History  Diagnosis Date  . Cervical radiculopathy, chronic     History   Social History  . Marital Status: Married    Spouse Name: N/A    Number of Children: N/A  . Years of Education: N/A   Occupational History  . Not on file.   Social History Main Topics  . Smoking status: Never Smoker   . Smokeless tobacco: Never Used  . Alcohol Use: Yes  . Drug Use: No  . Sexual Activity: Not on file   Other Topics Concern  . Not on file   Social History Narrative    Past Surgical History  Procedure Laterality Date  . Ent    . Ankle surgery      right  . Shoulder surgery      left    Family History  Problem Relation Age of Onset  . Heart disease Mother   . Cancer Father     prostate    No Known Allergies  Current Outpatient Prescriptions on File Prior to Visit  Medication Sig Dispense Refill  . finasteride (PROPECIA) 1 MG tablet TAKE 1 TABLET BY MOUTH ONCE A DAY 90 tablet 0  . ibuprofen (ADVIL,MOTRIN) 200 MG tablet Take 400 mg by mouth every 6 (six) hours as needed for pain.    . Multiple Vitamin  (MULTIVITAMIN) tablet Take 1 tablet by mouth daily.    . Omega-3 Fatty Acids (FISH OIL) 1000 MG CAPS Take 1 capsule by mouth daily.     No current facility-administered medications on file prior to visit.    BP 120/74 mmHg  Pulse 52  Temp(Src) 97.9 F (36.6 C) (Oral)  Resp 18  Ht 5\' 8"  (1.727 m)  Wt 155 lb (70.308 kg)  BMI 23.57 kg/m2  SpO2 98%   1. Risk factors, based on past  M,S,F history; no cardiovascular risk factors.-    2.  Physical activities: remains quite active. Jogs 3 or 4 times per week  3.  Depression/mood: no history depression or mood disorder  4.  Hearing: no deficits  5.  ADL's: independent in all aspects of daily living  6.  Fall risk: low no problems identified  7.  Home safety: no problems identified  8.  Height weight, and visual acuity; height and weight stable no change in visual acuity  9.  Counseling: heart healthy diet regular exercise all encouraged   10. Lab orders based on risk factors: laboratory profile including lipid panel and PSA reviewed   11. Referral : followup dermatology on an annual basis  12. Care plan: continue present regimen with active lifestyle and heart healthy diet .  Will need screening follow-up colonoscopy in one year  13. Cognitive assessment: Alert and oriented with normal affect. No cognitive dysfunction   14.  Preventive services will include annual health examinations with screening lab.  Patient was provided with a written and personalized care plan  15.  Provider list includes primary care and GI      Review of Systems  Constitutional: Negative for fever, chills, activity change, appetite change and fatigue.  HENT: Negative for congestion, dental problem, ear pain, hearing loss, mouth sores, rhinorrhea, sinus pressure, sneezing, tinnitus, trouble swallowing and voice change.   Eyes: Negative for photophobia, pain, redness and visual disturbance.  Respiratory: Negative for apnea, cough, choking, chest  tightness, shortness of breath and wheezing.   Cardiovascular: Negative for chest pain, palpitations and leg swelling.  Gastrointestinal: Negative for nausea, vomiting, abdominal pain, diarrhea, constipation, blood in stool, abdominal distention, anal bleeding and rectal pain.  Genitourinary: Negative for dysuria, urgency, frequency, hematuria, flank pain, decreased urine volume, discharge, penile swelling, scrotal swelling, difficulty urinating, genital sores and testicular pain.  Musculoskeletal: Negative for myalgias, back pain, joint swelling, arthralgias, gait problem, neck pain and neck stiffness.  Skin: Negative for color change, rash and wound.  Neurological: Negative for dizziness, tremors, seizures, syncope, facial asymmetry, speech difficulty, weakness, light-headedness, numbness and headaches.  Hematological: Negative for adenopathy. Does not bruise/bleed easily.  Psychiatric/Behavioral: Negative for suicidal ideas, hallucinations, behavioral problems, confusion, sleep disturbance, self-injury, dysphoric mood, decreased concentration and agitation. The patient is not nervous/anxious.        Objective:   Physical Exam  Constitutional: He appears well-developed and well-nourished.  HENT:  Head: Normocephalic and atraumatic.  Right Ear: External ear normal.  Left Ear: External ear normal.  Nose: Nose normal.  Mouth/Throat: Oropharynx is clear and moist.  Eyes: Conjunctivae and EOM are normal. Pupils are equal, round, and reactive to light. No scleral icterus.  Neck: Normal range of motion. Neck supple. No JVD present. No thyromegaly present.  Cardiovascular: Regular rhythm, normal heart sounds and intact distal pulses.  Exam reveals no gallop and no friction rub.   No murmur heard. Decreased right dorsalis pedis pulse  Pulmonary/Chest: Effort normal and breath sounds normal. He exhibits no tenderness.  Abdominal: Soft. Bowel sounds are normal. He exhibits no distension and no  mass. There is no tenderness.  Genitourinary: Prostate normal and penis normal.  Musculoskeletal: Normal range of motion. He exhibits no edema or tenderness.  Lymphadenopathy:    He has no cervical adenopathy.  Neurological: He is alert. He has normal reflexes. No cranial nerve deficit. Coordination normal.  Skin: Skin is warm and dry. No rash noted.  Psychiatric: He has a normal mood and affect. His behavior is normal.          Assessment & Plan:    preventive health examination Cervical osteoarthritis  We'll continue present active lifestyle. Patient is at ideal body weight. Laboratory studies reviewed lipid profile is low risk. Blood pressure is in a low-normal range Followup dermatology annually do to his pale complexion  Return here in one year or as needed

## 2014-10-06 NOTE — Patient Instructions (Signed)

## 2014-10-06 NOTE — Progress Notes (Signed)
Pre visit review using our clinic review tool, if applicable. No additional management support is needed unless otherwise documented below in the visit note. 

## 2014-11-01 ENCOUNTER — Other Ambulatory Visit: Payer: Self-pay | Admitting: Internal Medicine

## 2015-02-03 ENCOUNTER — Encounter: Payer: Self-pay | Admitting: Internal Medicine

## 2015-02-03 ENCOUNTER — Ambulatory Visit (INDEPENDENT_AMBULATORY_CARE_PROVIDER_SITE_OTHER): Payer: Medicare Other | Admitting: Internal Medicine

## 2015-02-03 VITALS — BP 130/72 | HR 48 | Temp 98.3°F | Resp 20 | Ht 68.0 in | Wt 160.0 lb

## 2015-02-03 DIAGNOSIS — R1084 Generalized abdominal pain: Secondary | ICD-10-CM | POA: Diagnosis not present

## 2015-02-03 LAB — CBC WITH DIFFERENTIAL/PLATELET
Basophils Absolute: 0 10*3/uL (ref 0.0–0.1)
Basophils Relative: 0.3 % (ref 0.0–3.0)
Eosinophils Absolute: 0.1 10*3/uL (ref 0.0–0.7)
Eosinophils Relative: 2.1 % (ref 0.0–5.0)
HCT: 42.4 % (ref 39.0–52.0)
Hemoglobin: 14.3 g/dL (ref 13.0–17.0)
Lymphocytes Relative: 17.7 % (ref 12.0–46.0)
Lymphs Abs: 1.2 10*3/uL (ref 0.7–4.0)
MCHC: 33.8 g/dL (ref 30.0–36.0)
MCV: 92.4 fl (ref 78.0–100.0)
Monocytes Absolute: 1.1 10*3/uL — ABNORMAL HIGH (ref 0.1–1.0)
Monocytes Relative: 15.6 % — ABNORMAL HIGH (ref 3.0–12.0)
Neutro Abs: 4.4 10*3/uL (ref 1.4–7.7)
Neutrophils Relative %: 64.3 % (ref 43.0–77.0)
Platelets: 177 10*3/uL (ref 150.0–400.0)
RBC: 4.59 Mil/uL (ref 4.22–5.81)
RDW: 13.1 % (ref 11.5–15.5)
WBC: 6.9 10*3/uL (ref 4.0–10.5)

## 2015-02-03 LAB — COMPREHENSIVE METABOLIC PANEL
ALT: 13 U/L (ref 0–53)
AST: 16 U/L (ref 0–37)
Albumin: 3.9 g/dL (ref 3.5–5.2)
Alkaline Phosphatase: 53 U/L (ref 39–117)
BUN: 16 mg/dL (ref 6–23)
CO2: 30 mEq/L (ref 19–32)
Calcium: 9.3 mg/dL (ref 8.4–10.5)
Chloride: 104 mEq/L (ref 96–112)
Creatinine, Ser: 1.1 mg/dL (ref 0.40–1.50)
GFR: 70.51 mL/min (ref >60.00)
Glucose, Bld: 90 mg/dL (ref 70–99)
Potassium: 3.8 mEq/L (ref 3.5–5.1)
Sodium: 141 mEq/L (ref 135–145)
Total Bilirubin: 0.4 mg/dL (ref 0.2–1.2)
Total Protein: 6.9 g/dL (ref 6.0–8.3)

## 2015-02-03 LAB — AMYLASE: Amylase: 72 U/L (ref 27–131)

## 2015-02-03 MED ORDER — HYDROCODONE-ACETAMINOPHEN 5-325 MG PO TABS: 1.0000 | ORAL_TABLET | Freq: Four times a day (QID) | ORAL | Status: DC | PRN

## 2015-02-03 MED ORDER — OMEPRAZOLE 40 MG PO CPDR: 40.0000 mg | DELAYED_RELEASE_CAPSULE | Freq: Every day | ORAL | Status: DC

## 2015-02-03 NOTE — Progress Notes (Signed)
Pre visit review using our clinic review tool, if applicable. No additional management support is needed unless otherwise documented below in the visit note. 

## 2015-02-03 NOTE — Patient Instructions (Signed)
Call or return to clinic prn if these symptoms worsen or fail to improve as anticipated.  Maintain hydration by drinking small amounts of clear fluids frequently, then soft diet, and then advance diet as tolerated.  Call if symptoms worsen, high fever, severe weakness or fainting, increased abdominal pain, blood in stool or vomit, or failure to improve in 2-3 days.

## 2015-02-03 NOTE — Progress Notes (Signed)
Subjective:    Patient ID: Edward Berger, male    DOB: 08-11-1946, 70 y.o.   MRN: [id number]  HPI  69 year old patient who presents with a five-day history of mid abdominal pain.  Pain, no waxes and wanes but is been fairly constant over the past 4-5 days.  There is been no nausea, vomiting or change in his bowel habits.  Food intake does not seem to aggravate or alleviate the pain.  He has had some benefit with Pepto-Bismol.  He has been using some ibuprofen throughout the week due to the discomfort. Earlier the week.  He also had a flulike illness with low-grade fever, chills and general sense of unwellness.  The symptoms have improved.  Past Medical History  Diagnosis Date  . Cervical radiculopathy, chronic     History   Social History  . Marital Status: Married    Spouse Name: N/A  . Number of Children: N/A  . Years of Education: N/A   Occupational History  . Not on file.   Social History Main Topics  . Smoking status: Never Smoker   . Smokeless tobacco: Never Used  . Alcohol Use: Yes  . Drug Use: No  . Sexual Activity: Not on file   Other Topics Concern  . Not on file   Social History Narrative    Past Surgical History  Procedure Laterality Date  . Ent    . Ankle surgery      right  . Shoulder surgery      left    Family History  Problem Relation Age of Onset  . Heart disease Mother   . Cancer Father     prostate    No Known Allergies  Current Outpatient Prescriptions on File Prior to Visit  Medication Sig Dispense Refill  . finasteride (PROPECIA) 1 MG tablet TAKE 1 TABLET BY MOUTH ONCE A DAY 90 tablet 0  . ibuprofen (ADVIL,MOTRIN) 200 MG tablet Take 400 mg by mouth every 6 (six) hours as needed for pain.    . Multiple Vitamin (MULTIVITAMIN) tablet Take 1 tablet by mouth daily.    . Omega-3 Fatty Acids (FISH OIL) 1000 MG CAPS Take 1 capsule by mouth daily.     No current facility-administered medications on file prior to visit.    BP 130/72  mmHg  Pulse 48  Temp(Src) 98.3 F (36.8 C) (Oral)  Resp 20  Ht 5\' 8"  (1.727 m)  Wt 160 lb (72.576 kg)  BMI 24.33 kg/m2  SpO2 98%     Review of Systems  Constitutional: Positive for activity change, appetite change and fatigue. Negative for fever and chills.  HENT: Negative for congestion, dental problem, ear pain, hearing loss, sore throat, tinnitus, trouble swallowing and voice change.   Eyes: Negative for pain, discharge and visual disturbance.  Respiratory: Negative for cough, chest tightness, wheezing and stridor.   Cardiovascular: Negative for chest pain, palpitations and leg swelling.  Gastrointestinal: Positive for abdominal pain. Negative for nausea, vomiting, diarrhea, constipation, blood in stool and abdominal distention.  Genitourinary: Negative for urgency, hematuria, flank pain, discharge, difficulty urinating and genital sores.  Musculoskeletal: Negative for myalgias, back pain, joint swelling, arthralgias, gait problem and neck stiffness.  Skin: Negative for rash.  Neurological: Negative for dizziness, syncope, speech difficulty, weakness, numbness and headaches.  Hematological: Negative for adenopathy. Does not bruise/bleed easily.  Psychiatric/Behavioral: Negative for behavioral problems and dysphoric mood. The patient is not nervous/anxious.        Objective:   Physical  Exam  Constitutional: He is oriented to person, place, and time. He appears well-developed.  HENT:  Head: Normocephalic.  Right Ear: External ear normal.  Left Ear: External ear normal.  Eyes: Conjunctivae and EOM are normal.  Neck: Normal range of motion.  Cardiovascular: Normal rate and normal heart sounds.   Pulmonary/Chest: Breath sounds normal.  Abdominal: Bowel sounds are normal. He exhibits no distension. There is tenderness. There is no rebound and no guarding.  Mild abdominal tenderness, most marked in the umbilical area Active bowel sounds  Musculoskeletal: Normal range of motion.  He exhibits no edema or tenderness.  Neurological: He is alert and oriented to person, place, and time.  Psychiatric: He has a normal mood and affect. His behavior is normal.          Assessment & Plan:   Abdominal pain.  Unclear etiology.  Will check some screening lab including amylase.  Will discontinue ibuprofen and treat with short-term Vicodin and omeprazole.  He will report any clinical worsening

## 2015-02-06 ENCOUNTER — Other Ambulatory Visit: Payer: Self-pay | Admitting: Internal Medicine

## 2015-05-15 ENCOUNTER — Other Ambulatory Visit: Payer: Self-pay | Admitting: Internal Medicine

## 2015-08-22 ENCOUNTER — Ambulatory Visit (INDEPENDENT_AMBULATORY_CARE_PROVIDER_SITE_OTHER): Payer: Medicare Other

## 2015-08-22 DIAGNOSIS — Z23 Encounter for immunization: Secondary | ICD-10-CM

## 2015-10-20 ENCOUNTER — Encounter: Payer: Self-pay | Admitting: Gastroenterology

## 2016-01-09 ENCOUNTER — Ambulatory Visit (INDEPENDENT_AMBULATORY_CARE_PROVIDER_SITE_OTHER): Payer: PPO | Admitting: Internal Medicine

## 2016-01-09 ENCOUNTER — Encounter: Payer: Self-pay | Admitting: Internal Medicine

## 2016-01-09 VITALS — BP 110/70 | HR 59 | Temp 98.6°F | Resp 18 | Ht 68.0 in | Wt 159.0 lb

## 2016-01-09 DIAGNOSIS — R29898 Other symptoms and signs involving the musculoskeletal system: Secondary | ICD-10-CM

## 2016-01-09 DIAGNOSIS — M5412 Radiculopathy, cervical region: Secondary | ICD-10-CM | POA: Diagnosis not present

## 2016-01-09 MED ORDER — GABAPENTIN 100 MG PO CAPS: 100.0000 mg | ORAL_CAPSULE | Freq: Three times a day (TID) | ORAL | Status: DC

## 2016-01-09 MED ORDER — HYDROCODONE-ACETAMINOPHEN 5-325 MG PO TABS: 1.0000 | ORAL_TABLET | Freq: Four times a day (QID) | ORAL | Status: DC | PRN

## 2016-01-09 NOTE — Patient Instructions (Signed)
Cervical MRI as discussed  Increase Neurontin to 2 tablets 3 times daily if tolerated  Take analgesics as scheduled  Follow-up one week

## 2016-01-09 NOTE — Progress Notes (Signed)
Pre visit review using our clinic review tool, if applicable. No additional management support is needed unless otherwise documented below in the visit note. 

## 2016-01-09 NOTE — Progress Notes (Signed)
Subjective:    Patient ID: Edward Berger, male    DOB: 1945/11/06, 69 y.o.   MRN: [id number]  HPI  15- year-old  RH patient who presents with a three-day history of right axillary pain.  This radiates to the right upper back area and down the right arm.  Symptoms have been present for 3 days.  Pain is described as a constant dull ache that at times becomes much more intense as a throbbing sensation.  Pain is aggravated by lying on his right side at night but otherwise no alleviating or provocative factors.  Pain is not aggravated by movement of the head or neck or the right arm or shoulder.  Pain radiates to the right arm outer aspect to the level of the wrist No fever or other constitutional complaints.  No history of recent trauma No skin rash   Past Medical History  Diagnosis Date  . Cervical radiculopathy, chronic     Social History   Social History  . Marital Status: Married    Spouse Name: N/A  . Number of Children: N/A  . Years of Education: N/A   Occupational History  . Not on file.   Social History Main Topics  . Smoking status: Never Smoker   . Smokeless tobacco: Never Used  . Alcohol Use: Yes  . Drug Use: No  . Sexual Activity: Not on file   Other Topics Concern  . Not on file   Social History Narrative    Past Surgical History  Procedure Laterality Date  . Ent    . Ankle surgery      right  . Shoulder surgery      left    Family History  Problem Relation Age of Onset  . Heart disease Mother   . Cancer Father     prostate    No Known Allergies  Current Outpatient Prescriptions on File Prior to Visit  Medication Sig Dispense Refill  . finasteride (PROPECIA) 1 MG tablet TAKE 1 TABLET BY MOUTH ONCE A DAY 90 tablet 3  . ibuprofen (ADVIL,MOTRIN) 200 MG tablet Take 400 mg by mouth every 6 (six) hours as needed for pain.    . Multiple Vitamin (MULTIVITAMIN) tablet Take 1 tablet by mouth daily.    . Omega-3 Fatty Acids (FISH OIL) 1000 MG CAPS Take 1  capsule by mouth daily.     No current facility-administered medications on file prior to visit.    BP 110/70 mmHg  Pulse 59  Temp(Src) 98.6 F (37 C) (Oral)  Resp 18  Ht 5\' 8"  (1.727 m)  Wt 159 lb (72.122 kg)  BMI 24.18 kg/m2  SpO2 98%    Review of Systems  Constitutional: Negative for fever, chills, appetite change and fatigue.  HENT: Negative for congestion, dental problem, ear pain, hearing loss, sore throat, tinnitus, trouble swallowing and voice change.   Eyes: Negative for pain, discharge and visual disturbance.  Respiratory: Negative for cough, chest tightness, wheezing and stridor.   Cardiovascular: Negative for chest pain, palpitations and leg swelling.  Gastrointestinal: Negative for nausea, vomiting, abdominal pain, diarrhea, constipation, blood in stool and abdominal distention.  Genitourinary: Negative for urgency, hematuria, flank pain, discharge, difficulty urinating and genital sores.  Musculoskeletal: Negative for myalgias, back pain, joint swelling, arthralgias, gait problem, neck pain and neck stiffness.  Skin: Negative for rash.  Neurological: Negative for dizziness, syncope, speech difficulty, weakness, numbness and headaches.  Hematological: Negative for adenopathy. Does not bruise/bleed easily.  Psychiatric/Behavioral: Negative for  behavioral problems and dysphoric mood. The patient is not nervous/anxious.        Objective:   Physical Exam  Constitutional: He appears well-developed and well-nourished. No distress.  Blood pressure low normal Pulse 59 Afebrile  Musculoskeletal:  Full range of motion.  Neck and right shoulder  Neurological:  The right biceps and triceps reflexes, decreased compared to the left Very minimal decrease right hand grip compared to the left (RHed) Sensory exam normal  Skin:  No dermatitis No tenderness or adenopathy.  Right axilla          Assessment & Plan:   Painful dysesthesia of the right axilla, upper back  and right arm, associated with blunting of right biceps and triceps reflexes and diminished right hand grip We'll proceed with cervical MRI May need nerve conduction studies and/or neurology referral  Will treat symptomatically and place on Neurontin

## 2016-01-11 ENCOUNTER — Telehealth: Payer: Self-pay | Admitting: Internal Medicine

## 2016-01-11 NOTE — Telephone Encounter (Signed)
Pt request refill of the following: HYDROcodone-acetaminophen (NORCO/VICODIN) 5-325 MG tablet ,  gabapentin (NEURONTIN) 100 MG capsule  Pt said the above meds is not working and is asking if there is something else he can take     Phamacy:

## 2016-01-11 NOTE — Telephone Encounter (Signed)
Left message on voicemail that Dr.K is not in the office will be back on Monday. I will discuss it with him then and get back to you. Any questions please call back.

## 2016-01-14 ENCOUNTER — Ambulatory Visit
Admission: RE | Admit: 2016-01-14 | Discharge: 2016-01-14 | Disposition: A | Discharge status: Home / Self Care | Payer: PPO | Source: Ambulatory Visit | Attending: Internal Medicine | Admitting: Internal Medicine

## 2016-01-14 DIAGNOSIS — M4806 Spinal stenosis, lumbar region: Secondary | ICD-10-CM | POA: Diagnosis not present

## 2016-01-14 DIAGNOSIS — M5412 Radiculopathy, cervical region: Secondary | ICD-10-CM

## 2016-01-15 ENCOUNTER — Other Ambulatory Visit: Payer: Self-pay | Admitting: Internal Medicine

## 2016-01-15 ENCOUNTER — Ambulatory Visit (INDEPENDENT_AMBULATORY_CARE_PROVIDER_SITE_OTHER): Payer: PPO | Admitting: *Deleted

## 2016-01-15 DIAGNOSIS — M25511 Pain in right shoulder: Secondary | ICD-10-CM | POA: Diagnosis not present

## 2016-01-15 DIAGNOSIS — M5412 Radiculopathy, cervical region: Secondary | ICD-10-CM | POA: Diagnosis not present

## 2016-01-15 DIAGNOSIS — M79621 Pain in right upper arm: Secondary | ICD-10-CM

## 2016-01-15 MED ORDER — METHYLPREDNISOLONE ACETATE 80 MG/ML IJ SUSP
80.0000 mg | Freq: Once | INTRAMUSCULAR | Status: AC
Start: 2016-01-15 — End: 2016-01-15
  Administered 2016-01-15: 80 mg via INTRAMUSCULAR

## 2016-01-15 MED ORDER — OXYCODONE-ACETAMINOPHEN 5-300 MG PO TABS: 1.0000 | ORAL_TABLET | ORAL | Status: DC | PRN

## 2016-01-15 NOTE — Telephone Encounter (Signed)
Please see message and advise 

## 2016-01-15 NOTE — Telephone Encounter (Signed)
Dr.K spoke to [facility] pharmacist.

## 2016-01-15 NOTE — Telephone Encounter (Signed)
[facility] would like dx for hydrocodone

## 2016-01-17 ENCOUNTER — Ambulatory Visit: Payer: PPO | Admitting: Internal Medicine

## 2016-01-25 DIAGNOSIS — M542 Cervicalgia: Secondary | ICD-10-CM | POA: Diagnosis not present

## 2016-01-25 DIAGNOSIS — M9981 Other biomechanical lesions of cervical region: Secondary | ICD-10-CM | POA: Diagnosis not present

## 2016-01-25 DIAGNOSIS — R030 Elevated blood-pressure reading, without diagnosis of hypertension: Secondary | ICD-10-CM | POA: Diagnosis not present

## 2016-01-25 DIAGNOSIS — M5412 Radiculopathy, cervical region: Secondary | ICD-10-CM | POA: Diagnosis not present

## 2016-01-26 DIAGNOSIS — M9981 Other biomechanical lesions of cervical region: Secondary | ICD-10-CM | POA: Diagnosis not present

## 2016-02-12 DIAGNOSIS — M9981 Other biomechanical lesions of cervical region: Secondary | ICD-10-CM | POA: Diagnosis not present

## 2016-05-20 ENCOUNTER — Other Ambulatory Visit: Payer: Self-pay | Admitting: Internal Medicine

## 2016-05-20 NOTE — Telephone Encounter (Signed)
Rx refill sent to pharmacy. 

## 2016-07-15 ENCOUNTER — Ambulatory Visit (INDEPENDENT_AMBULATORY_CARE_PROVIDER_SITE_OTHER): Payer: PPO | Admitting: *Deleted

## 2016-07-15 DIAGNOSIS — Z23 Encounter for immunization: Secondary | ICD-10-CM

## 2016-07-23 DIAGNOSIS — L570 Actinic keratosis: Secondary | ICD-10-CM | POA: Diagnosis not present

## 2016-07-23 DIAGNOSIS — Z23 Encounter for immunization: Secondary | ICD-10-CM | POA: Diagnosis not present

## 2016-07-23 DIAGNOSIS — H1132 Conjunctival hemorrhage, left eye: Secondary | ICD-10-CM | POA: Diagnosis not present

## 2016-08-27 ENCOUNTER — Other Ambulatory Visit: Payer: Self-pay | Admitting: Internal Medicine

## 2016-09-30 HISTORY — PX: COLONOSCOPY: SHX174

## 2016-10-02 DIAGNOSIS — L821 Other seborrheic keratosis: Secondary | ICD-10-CM | POA: Diagnosis not present

## 2016-10-02 DIAGNOSIS — D485 Neoplasm of uncertain behavior of skin: Secondary | ICD-10-CM | POA: Diagnosis not present

## 2016-10-02 DIAGNOSIS — L570 Actinic keratosis: Secondary | ICD-10-CM | POA: Diagnosis not present

## 2016-10-02 DIAGNOSIS — D2371 Other benign neoplasm of skin of right lower limb, including hip: Secondary | ICD-10-CM | POA: Diagnosis not present

## 2016-10-02 DIAGNOSIS — Z23 Encounter for immunization: Secondary | ICD-10-CM | POA: Diagnosis not present

## 2016-10-23 DIAGNOSIS — Z23 Encounter for immunization: Secondary | ICD-10-CM | POA: Diagnosis not present

## 2016-10-23 DIAGNOSIS — L570 Actinic keratosis: Secondary | ICD-10-CM | POA: Diagnosis not present

## 2016-11-07 DIAGNOSIS — C44619 Basal cell carcinoma of skin of left upper limb, including shoulder: Secondary | ICD-10-CM | POA: Diagnosis not present

## 2016-12-20 ENCOUNTER — Ambulatory Visit (INDEPENDENT_AMBULATORY_CARE_PROVIDER_SITE_OTHER): Payer: PPO | Admitting: Internal Medicine

## 2016-12-20 ENCOUNTER — Ambulatory Visit (INDEPENDENT_AMBULATORY_CARE_PROVIDER_SITE_OTHER)
Admission: RE | Admit: 2016-12-20 | Discharge: 2016-12-20 | Disposition: A | Discharge status: Home / Self Care | Payer: PPO | Source: Ambulatory Visit | Attending: Internal Medicine | Admitting: Internal Medicine

## 2016-12-20 ENCOUNTER — Encounter: Payer: Self-pay | Admitting: Internal Medicine

## 2016-12-20 VITALS — BP 120/66 | HR 71 | Temp 98.0°F | Ht 68.0 in | Wt 158.9 lb

## 2016-12-20 DIAGNOSIS — J22 Unspecified acute lower respiratory infection: Secondary | ICD-10-CM | POA: Diagnosis not present

## 2016-12-20 DIAGNOSIS — R509 Fever, unspecified: Secondary | ICD-10-CM

## 2016-12-20 DIAGNOSIS — J989 Respiratory disorder, unspecified: Secondary | ICD-10-CM

## 2016-12-20 DIAGNOSIS — R0602 Shortness of breath: Secondary | ICD-10-CM | POA: Diagnosis not present

## 2016-12-20 MED ORDER — DOXYCYCLINE HYCLATE 100 MG PO TABS: 100.0000 mg | ORAL_TABLET | Freq: Two times a day (BID) | ORAL | 0 refills | Status: DC

## 2016-12-20 NOTE — Patient Instructions (Signed)
  I think you may have early or low-grade pneumonia. Get chest x-ray today We'll send in antibiotic after I see the x-ray report. Either way your fevers feeling should be better within 3-4 days or less Depending on x-ray results we'll plan follow-up with [name].  Seek emergent care if shortness of breath or severe worsening.

## 2016-12-20 NOTE — Progress Notes (Signed)
Chief Complaint  Patient presents with  . Cough    tried mucinex DM and Dyquil  . Nasal Congestion    chills, achy body     HPI: Edward Berger 71 y.o.  SDA  pcp NA   Had a respiratory illness that got better after symptomatic treatment Mucinex DM. He got symptoms soon after upper respiratory congestion and a cough low-grade achiness which are 99 had tried different medicines including DayQuil occasional ibuprofen. However use having continued fevers feeling without a temperature and some chills and myalgias temperature 90.9 range. No shortness of breath but still has a cough that is minimally productive worse at night. Upper respiratory congestion mild no specific pain no hemoptysis history of lung disease. His total illness is been going on 3 weeks. Not improving at this time. ROS: See pertinent positives and negatives per HPI. No cp sob  nvd face pain  Past Medical History:  Diagnosis Date  . Cervical radiculopathy, chronic     Family History  Problem Relation Age of Onset  . Heart disease Mother   . Cancer Father     prostate    Social History   Social History  . Marital status: Married    Spouse name: N/A  . Number of children: N/A  . Years of education: N/A   Social History Main Topics  . Smoking status: Never Smoker  . Smokeless tobacco: Never Used  . Alcohol use Yes  . Drug use: No  . Sexual activity: Not Asked   Other Topics Concern  . None   Social History Narrative  . None    Outpatient Medications Prior to Visit  Medication Sig Dispense Refill  . finasteride (PROPECIA) 1 MG tablet TAKE 1 TABLET BY MOUTH ONCE A DAY 90 tablet 2  . ibuprofen (ADVIL,MOTRIN) 200 MG tablet Take 400 mg by mouth every 6 (six) hours as needed for pain.    . Multiple Vitamin (MULTIVITAMIN) tablet Take 1 tablet by mouth daily.    . Omega-3 Fatty Acids (FISH OIL) 1000 MG CAPS Take 1 capsule by mouth daily.    Marland Kitchen gabapentin (NEURONTIN) 100 MG capsule Take 1 capsule (100 mg  total) by mouth 3 (three) times daily. 90 capsule 3  . HYDROcodone-acetaminophen (NORCO/VICODIN) 5-325 MG tablet Take 1 tablet by mouth every 6 (six) hours as needed for moderate pain. 60 tablet 0  . oxycodone-acetaminophen (LYNOX) 5-300 MG tablet Take 1 tablet by mouth every 4 (four) hours as needed for pain. 30 tablet 0   No facility-administered medications prior to visit.      EXAM:  BP 120/66 (BP Location: Left Arm, Patient Position: Sitting, Cuff Size: Normal)   Pulse 71   Temp 98 F (36.7 C) (Oral)   Ht 5\' 8"  (1.727 m)   Wt 158 lb 14.4 oz (72.1 kg)   SpO2 97%   BMI 24.16 kg/m   Body mass index is 24.16 kg/m. WDWN in NAD  quiet respirations; mildly congested   Non toxic . Looks well  HEENT: Normocephalic ;atraumatic , Eyes;  PERRL, EOMs  Full, lids and conjunctiva clear,,Ears: no deformities, canals nl, TM landmarks normal, Nose: no deformity or discharge but congested;face minimally tender Mouth : OP clear without lesion or edema . Neck: Supple without adenopathy or masses or bruits Chest:    bibalsilar ? Dec bs lleft more than right whith ? Rale or muscial sounds  Present after coughing  CV:  S1-S2 no gallops or murmurs peripheral perfusion is normal  Skin :nl perfusion and no acute rashes   ASSESSMENT AND PLAN:  Discussed the following assessment and plan:  Respiratory illness with fever - Plan: DG Chest 2 View  Lower respiratory infection (e.g., bronchitis, pneumonia, pneumonitis, pulmonitis) - clincal pneumonia sx  Clinical of pneumonia or pneumonitis. 3 week illness with myalgias chills and low-grade fever. Get chest x-ray today would add antibiotics either way depending on results and plan follow-up expectant management. -Patient advised to return or notify health care team  if symptoms worsen ,persist or new concerns arise.  Patient Instructions   I think you may have early or low-grade pneumonia. Get chest x-ray today We'll send in antibiotic after I see the  x-ray report. Either way your fevers feeling should be better within 3-4 days or less Depending on x-ray results we'll plan follow-up with [name].  Seek emergent care if shortness of breath or severe worsening.    [name]. [name] M.D.

## 2017-01-03 ENCOUNTER — Other Ambulatory Visit: Payer: PPO

## 2017-01-10 ENCOUNTER — Ambulatory Visit (INDEPENDENT_AMBULATORY_CARE_PROVIDER_SITE_OTHER): Payer: PPO | Admitting: Internal Medicine

## 2017-01-10 ENCOUNTER — Encounter: Payer: Self-pay | Admitting: Internal Medicine

## 2017-01-10 VITALS — BP 114/62 | HR 51 | Temp 97.5°F | Ht 69.0 in | Wt 153.6 lb

## 2017-01-10 DIAGNOSIS — Z0000 Encounter for general adult medical examination without abnormal findings: Secondary | ICD-10-CM | POA: Diagnosis not present

## 2017-01-10 DIAGNOSIS — Z23 Encounter for immunization: Secondary | ICD-10-CM | POA: Diagnosis not present

## 2017-01-10 DIAGNOSIS — H833X1 Noise effects on right inner ear: Secondary | ICD-10-CM

## 2017-01-10 NOTE — Progress Notes (Signed)
Subjective:    Patient ID: Edward Berger, male    DOB: March 22, 1946, 71 y.o.   MRN: [id number]  HPI 71 year old patient who is seen today for a preventive health examination and Medicare wellness visit. 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  He has been seen by neurosurgery and is doing quite well clinically. No major concerns or complaints  Past Medical History:  Diagnosis Date  . Cervical radiculopathy, chronic      Social History   Social History  . Marital status: Married    Spouse name: N/A  . Number of children: N/A  . Years of education: N/A   Occupational History  . Not on file.   Social History Main Topics  . Smoking status: Never Smoker  . Smokeless tobacco: Never Used  . Alcohol use Yes  . Drug use: No  . Sexual activity: Not on file   Other Topics Concern  . Not on file   Social History Narrative  . No narrative on file    Past Surgical History:  Procedure Laterality Date  . ANKLE SURGERY     right  . ENT    . SHOULDER SURGERY     left    Family History  Problem Relation Age of Onset  . Heart disease Mother   . Cancer Father     prostate    No Known Allergies  Current Outpatient Prescriptions on File Prior to Visit  Medication Sig Dispense Refill  . finasteride (PROPECIA) 1 MG tablet TAKE 1 TABLET BY MOUTH ONCE A DAY 90 tablet 2  . ibuprofen (ADVIL,MOTRIN) 200 MG tablet Take 400 mg by mouth every 6 (six) hours as needed for pain.    . Multiple Vitamin (MULTIVITAMIN) tablet Take 1 tablet by mouth daily.    . Omega-3 Fatty Acids (FISH OIL) 1000 MG CAPS Take 1 capsule by mouth daily.     No current facility-administered medications on file prior to visit.     BP 114/62 (BP Location: Left Arm, Patient Position: Sitting, Cuff Size: Normal)   Pulse (!) 51   Temp 97.5 F (36.4 C) (Oral)   Ht 5\' 9"  (1.753 m)   Wt 153 lb 9.6 oz (69.7 kg)   SpO2 98%   BMI 22.68 kg/m    Medicare wellness visit  1. Risk factors, based on past  M,S,F history.  No significant cardiovascular risk factors  2.  Physical activities:remains quite active; runs or bikes at least 4 times per week  3.  Depression/mood:no history of major depression or mood disorder  4.  Hearing:no deficits  5.  ADL's:independent  6.  Fall risk:low  7.  Home safety:no problems identified  8.  Height weight, and visual acuity;height and weight stable no change in visual acuity  9.  Counseling:follow colonoscopy urged  10. Lab orders based on risk factors:we'll check laboratory update  11. Referral :GI referral.  Needs annual eye examination  12. Care plan:continue efforts at aggressive risk factor modification 13. Cognitive assessment: alert and oriented with normal affect no cognitive dysfunction 14. Screening: Patient provided with a written and personalized 5-10 year screening schedule in the AVS.    15. Provider List Update: primary care.  GI and ophthalmology     Review of Systems  Constitutional: Negative for activity change, appetite change, chills, fatigue and fever.  HENT: Negative for congestion, dental problem, ear pain, hearing loss, mouth sores, rhinorrhea, sinus pressure, sneezing, tinnitus, trouble swallowing  and voice change.   Eyes: Negative for photophobia, pain, redness and visual disturbance.  Respiratory: Negative for apnea, cough, choking, chest tightness, shortness of breath and wheezing.   Cardiovascular: Negative for chest pain, palpitations and leg swelling.  Gastrointestinal: Positive for blood in stool. Negative for abdominal distention, abdominal pain, anal bleeding, constipation, diarrhea, nausea, rectal pain and vomiting.  Genitourinary: Positive for urgency. Negative for decreased urine volume, difficulty urinating, discharge, dysuria, flank pain, frequency, genital sores, hematuria, penile swelling, scrotal swelling and testicular pain.   Musculoskeletal: Negative for arthralgias, back pain, gait problem, joint swelling, myalgias, neck pain and neck stiffness.  Skin: Negative for color change, rash and wound.  Neurological: Negative for dizziness, tremors, seizures, syncope, facial asymmetry, speech difficulty, weakness, light-headedness, numbness and headaches.  Hematological: Negative for adenopathy. Does not bruise/bleed easily.  Psychiatric/Behavioral: Negative for agitation, behavioral problems, confusion, decreased concentration, dysphoric mood, hallucinations, self-injury, sleep disturbance and suicidal ideas. The patient is not nervous/anxious.        Objective:   Physical Exam  Constitutional: He appears well-developed and well-nourished.  HENT:  Head: Normocephalic and atraumatic.  Right Ear: External ear normal.  Left Ear: External ear normal.  Nose: Nose normal.  Mouth/Throat: Oropharynx is clear and moist.  Wax left canal  Eyes: Conjunctivae and EOM are normal. Pupils are equal, round, and reactive to light. No scleral icterus.  Neck: Normal range of motion. Neck supple. No JVD present. No thyromegaly present.  Cardiovascular: Regular rhythm, normal heart sounds and intact distal pulses.  Exam reveals no gallop and no friction rub.   No murmur heard. Decreased right dorsalis pedis pulse  Pulmonary/Chest: Effort normal and breath sounds normal. He exhibits no tenderness.  Abdominal: Soft. Bowel sounds are normal. He exhibits no distension and no mass. There is no tenderness.  Genitourinary: Prostate normal and penis normal. Rectal exam shows guaiac positive stool.  Musculoskeletal: Normal range of motion. He exhibits no edema or tenderness.  Lymphadenopathy:    He has no cervical adenopathy.  Neurological: He is alert. He has normal reflexes. No cranial nerve deficit. Coordination normal.  Skin: Skin is warm and dry. No rash noted.  Psychiatric: He has a normal mood and affect. His behavior is normal.           Assessment & Plan:   Preventive health examination Medicare wellness visit Cervical spinal stenosis.  Stable Heme positive stool.  Patient knows intermittent bright red rectal bleeding.  Will schedule follow-up colonoscopy  Return in one year for follow-up  Nyoka Cowden

## 2017-01-10 NOTE — Progress Notes (Signed)
Pre visit review using our clinic review tool, if applicable. No additional management support is needed unless otherwise documented below in the visit note. 

## 2017-01-10 NOTE — Patient Instructions (Addendum)
WE NOW OFFER   [facility] FAST TRACK!!!  SAME DAY Appointments for ACUTE CARE  Such as: Sprains, Injuries, cuts, abrasions, rashes, muscle pain, joint pain, back pain Colds, flu, sore throats, headache, allergies, cough, fever  Ear pain, sinus and eye infections Abdominal pain, nausea, vomiting, diarrhea, upset stomach Animal/insect bites  3 Easy Ways to Schedule: Walk-In Scheduling Call in scheduling Mychart Sign-up: [email or web address]   Schedule your colonoscopy to help detect colon cancer.    It is important that you exercise regularly, at least 20 minutes 3 to 4 times per week.  If you develop chest pain or shortness of breath seek  medical attention.  Return in one year for follow-up        Health Maintenance, Male A healthy lifestyle and preventive care is important for your health and wellness. Ask your health care provider about what schedule of regular examinations is right for you. What should I know about weight and diet?  Eat a Healthy Diet  Eat plenty of vegetables, fruits, whole grains, low-fat dairy products, and lean protein.  Do not eat a lot of foods high in solid fats, added sugars, or salt. Maintain a Healthy Weight  Regular exercise can help you achieve or maintain a healthy weight. You should:  Do at least 150 minutes of exercise each week. The exercise should increase your heart rate and make you sweat (moderate-intensity exercise).  Do strength-training exercises at least twice a week. Watch Your Levels of Cholesterol and Blood Lipids  Have your blood tested for lipids and cholesterol every 5 years starting at 71 years of age. If you are at high risk for heart disease, you should start having your blood tested when you are 71 years old. You may need to have your cholesterol levels checked more often if:  Your lipid or cholesterol levels are high.  You are older than 71 years of age.  You are at high risk for heart  disease. What should I know about cancer screening? Many types of cancers can be detected early and may often be prevented. Lung Cancer  You should be screened every year for lung cancer if:  You are a current smoker who has smoked for at least 30 years.  You are a former smoker who has quit within the past 15 years.  Talk to your health care provider about your screening options, when you should start screening, and how often you should be screened. Colorectal Cancer  Routine colorectal cancer screening usually begins at 71 years of age and should be repeated every 5-10 years until you are 71 years old. You may need to be screened more often if early forms of precancerous polyps or small growths are found. Your health care provider may recommend screening at an earlier age if you have risk factors for colon cancer.  Your health care provider may recommend using home test kits to check for hidden blood in the stool.  A small camera at the end of a tube can be used to examine your colon (sigmoidoscopy or colonoscopy). This checks for the earliest forms of colorectal cancer. Prostate and Testicular Cancer  Depending on your age and overall health, your health care provider may do certain tests to screen for prostate and testicular cancer.  Talk to your health care provider about any symptoms or concerns you have about testicular or prostate cancer. Skin Cancer  Check your skin from head to toe regularly.  Tell your health care provider about any new  moles or changes in moles, especially if:  There is a change in a mole's size, shape, or color.  You have a mole that is larger than a pencil eraser.  Always use sunscreen. Apply sunscreen liberally and repeat throughout the day.  Protect yourself by wearing long sleeves, pants, a wide-brimmed hat, and sunglasses when outside. What should I know about heart disease, diabetes, and high blood pressure?  If you are 58-66 years of age,  have your blood pressure checked every 3-5 years. If you are 58 years of age or older, have your blood pressure checked every year. You should have your blood pressure measured twice-once when you are at a hospital or clinic, and once when you are not at a hospital or clinic. Record the average of the two measurements. To check your blood pressure when you are not at a hospital or clinic, you can use:  An automated blood pressure machine at a pharmacy.  A home blood pressure monitor.  Talk to your health care provider about your target blood pressure.  If you are between 77-56 years old, ask your health care provider if you should take aspirin to prevent heart disease.  Have regular diabetes screenings by checking your fasting blood sugar level.  If you are at a normal weight and have a low risk for diabetes, have this test once every three years after the age of 79.  If you are overweight and have a high risk for diabetes, consider being tested at a younger age or more often.  A one-time screening for abdominal aortic aneurysm (AAA) by ultrasound is recommended for men aged 85-75 years who are current or former smokers. What should I know about preventing infection? Hepatitis B  If you have a higher risk for hepatitis B, you should be screened for this virus. Talk with your health care provider to find out if you are at risk for hepatitis B infection. Hepatitis C  Blood testing is recommended for:  Everyone born from [birth year] through 1965.  Anyone with known risk factors for hepatitis C. Sexually Transmitted Diseases (STDs)  You should be screened each year for STDs including gonorrhea and chlamydia if:  You are sexually active and are younger than 71 years of age.  You are older than 71 years of age and your health care provider tells you that you are at risk for this type of infection.  Your sexual activity has changed since you were last screened and you are at an increased risk for  chlamydia or gonorrhea. Ask your health care provider if you are at risk.  Talk with your health care provider about whether you are at high risk of being infected with HIV. Your health care provider may recommend a prescription medicine to help prevent HIV infection. What else can I do?  Schedule regular health, dental, and eye exams.  Stay current with your vaccines (immunizations).  Do not use any tobacco products, such as cigarettes, chewing tobacco, and e-cigarettes. If you need help quitting, ask your health care provider.  Limit alcohol intake to no more than 2 drinks per day. One drink equals 12 ounces of beer, 5 ounces of wine, or 1 ounces of hard liquor.  Do not use street drugs.  Do not share needles.  Ask your health care provider for help if you need support or information about quitting drugs.  Tell your health care provider if you often feel depressed.  Tell your health care provider if you have  ever been abused or do not feel safe at home. This information is not intended to replace advice given to you by your health care provider. Make sure you discuss any questions you have with your health care provider. Document Released: 03/14/2008 Document Revised: 05/15/2016 Document Reviewed: 06/20/2015 Elsevier Interactive Patient Education  2017 [employer].

## 2017-01-15 ENCOUNTER — Other Ambulatory Visit (INDEPENDENT_AMBULATORY_CARE_PROVIDER_SITE_OTHER): Payer: PPO

## 2017-01-15 DIAGNOSIS — Z0000 Encounter for general adult medical examination without abnormal findings: Secondary | ICD-10-CM | POA: Diagnosis not present

## 2017-01-15 LAB — CBC WITH DIFFERENTIAL/PLATELET
Basophils Absolute: 0 10*3/uL (ref 0.0–0.1)
Basophils Relative: 0.5 % (ref 0.0–3.0)
Eosinophils Absolute: 0.1 10*3/uL (ref 0.0–0.7)
Eosinophils Relative: 2.1 % (ref 0.0–5.0)
HCT: 42.3 % (ref 39.0–52.0)
Hemoglobin: 14.3 g/dL (ref 13.0–17.0)
Lymphocytes Relative: 19.3 % (ref 12.0–46.0)
Lymphs Abs: 1.3 10*3/uL (ref 0.7–4.0)
MCHC: 33.9 g/dL (ref 30.0–36.0)
MCV: 92.4 fl (ref 78.0–100.0)
Monocytes Absolute: 0.7 10*3/uL (ref 0.1–1.0)
Monocytes Relative: 10.5 % (ref 3.0–12.0)
Neutro Abs: 4.5 10*3/uL (ref 1.4–7.7)
Neutrophils Relative %: 67.6 % (ref 43.0–77.0)
Platelets: 229 10*3/uL (ref 150.0–400.0)
RBC: 4.58 Mil/uL (ref 4.22–5.81)
RDW: 13.3 % (ref 11.5–15.5)
WBC: 6.7 10*3/uL (ref 4.0–10.5)

## 2017-01-15 LAB — HEPATIC FUNCTION PANEL
ALT: 14 U/L (ref 0–53)
AST: 17 U/L (ref 0–37)
Albumin: 4 g/dL (ref 3.5–5.2)
Alkaline Phosphatase: 55 U/L (ref 39–117)
Bilirubin, Direct: 0.1 mg/dL (ref 0.0–0.3)
Total Bilirubin: 0.6 mg/dL (ref 0.2–1.2)
Total Protein: 6.6 g/dL (ref 6.0–8.3)

## 2017-01-15 LAB — POC URINALSYSI DIPSTICK (AUTOMATED)
Bilirubin, UA: NEGATIVE
Glucose, UA: NEGATIVE
Ketones, UA: NEGATIVE
Leukocytes, UA: NEGATIVE
Nitrite, UA: NEGATIVE
Spec Grav, UA: >=1.03 — AB (ref 1.010–1.025)
Urobilinogen, UA: 0.2 E.U./dL
pH, UA: 6 (ref 5.0–8.0)

## 2017-01-15 LAB — BASIC METABOLIC PANEL
BUN: 19 mg/dL (ref 6–23)
CO2: 30 mEq/L (ref 19–32)
Calcium: 9.2 mg/dL (ref 8.4–10.5)
Chloride: 104 mEq/L (ref 96–112)
Creatinine, Ser: 1.31 mg/dL (ref 0.40–1.50)
GFR: 57.31 mL/min — ABNORMAL LOW (ref >60.00)
Glucose, Bld: 104 mg/dL — ABNORMAL HIGH (ref 70–99)
Potassium: 4.3 mEq/L (ref 3.5–5.1)
Sodium: 141 mEq/L (ref 135–145)

## 2017-01-15 LAB — LIPID PANEL
Cholesterol: 185 mg/dL (ref 0–200)
HDL: 57.5 mg/dL (ref >39.00)
LDL Cholesterol: 114 mg/dL — ABNORMAL HIGH (ref 0–99)
NonHDL: 127.63
Total CHOL/HDL Ratio: 3
Triglycerides: 66 mg/dL (ref 0.0–149.0)
VLDL: 13.2 mg/dL (ref 0.0–40.0)

## 2017-01-15 LAB — TSH: TSH: 1.13 u[IU]/mL (ref 0.35–4.50)

## 2017-02-27 ENCOUNTER — Ambulatory Visit (AMBULATORY_SURGERY_CENTER): Payer: Self-pay | Admitting: *Deleted

## 2017-02-27 VITALS — Ht 69.0 in | Wt 153.0 lb

## 2017-02-27 DIAGNOSIS — Z1211 Encounter for screening for malignant neoplasm of colon: Secondary | ICD-10-CM

## 2017-02-27 MED ORDER — NA SULFATE-K SULFATE-MG SULF 17.5-3.13-1.6 GM/177ML PO SOLN: 1.0000 | Freq: Once | ORAL | 0 refills | Status: AC

## 2017-02-27 NOTE — Progress Notes (Signed)
No egg or soy allergy known to patient  No issues with past sedation with any surgeries  or procedures, no intubation problems  No diet pills per patient No home 02 use per patient  No blood thinners per patient  Pt denies issues with constipation  No A fib or A flutter  EMMI video sent to pt's e mail  PNM $50 coupon to pt for suprep

## 2017-03-13 ENCOUNTER — Encounter: Payer: Self-pay | Admitting: Gastroenterology

## 2017-03-27 ENCOUNTER — Ambulatory Visit (AMBULATORY_SURGERY_CENTER): Payer: PPO | Admitting: Gastroenterology

## 2017-03-27 ENCOUNTER — Encounter: Payer: Self-pay | Admitting: Gastroenterology

## 2017-03-27 VITALS — BP 121/66 | HR 52 | Temp 96.9°F | Resp 11 | Ht 69.0 in | Wt 153.0 lb

## 2017-03-27 DIAGNOSIS — D123 Benign neoplasm of transverse colon: Secondary | ICD-10-CM

## 2017-03-27 DIAGNOSIS — Z1212 Encounter for screening for malignant neoplasm of rectum: Secondary | ICD-10-CM | POA: Diagnosis not present

## 2017-03-27 DIAGNOSIS — Z1211 Encounter for screening for malignant neoplasm of colon: Secondary | ICD-10-CM

## 2017-03-27 MED ORDER — SODIUM CHLORIDE 0.9 % IV SOLN: 500.0000 mL | INTRAVENOUS | Status: DC

## 2017-03-27 NOTE — Patient Instructions (Signed)
Discharge instructions given. Handouts on polyps and diverticulosis. Resume previous medications. YOU HAD AN ENDOSCOPIC PROCEDURE TODAY AT THE [facility]:   Refer to the procedure report that was given to you for any specific questions about what was found during the examination.  If the procedure report does not answer your questions, please call your gastroenterologist to clarify.  If you requested that your care partner not be given the details of your procedure findings, then the procedure report has been included in a sealed envelope for you to review at your convenience later.  YOU SHOULD EXPECT: Some feelings of bloating in the abdomen. Passage of more gas than usual.  Walking can help get rid of the air that was put into your GI tract during the procedure and reduce the bloating. If you had a lower endoscopy (such as a colonoscopy or flexible sigmoidoscopy) you may notice spotting of blood in your stool or on the toilet paper. If you underwent a bowel prep for your procedure, you may not have a normal bowel movement for a few days.  Please Note:  You might notice some irritation and congestion in your nose or some drainage.  This is from the oxygen used during your procedure.  There is no need for concern and it should clear up in a day or so.  SYMPTOMS TO REPORT IMMEDIATELY:   Following lower endoscopy (colonoscopy or flexible sigmoidoscopy):  Excessive amounts of blood in the stool  Significant tenderness or worsening of abdominal pains  Swelling of the abdomen that is new, acute  Fever of 100F or higher   For urgent or emergent issues, a gastroenterologist can be reached at any hour by calling [phone].   DIET:  We do recommend a small meal at first, but then you may proceed to your regular diet.  Drink plenty of fluids but you should avoid alcoholic beverages for 24 hours.  ACTIVITY:  You should plan to take it easy for the rest of today and you should NOT  DRIVE or use heavy machinery until tomorrow (because of the sedation medicines used during the test).    FOLLOW UP: Our staff will call the number listed on your records the next business day following your procedure to check on you and address any questions or concerns that you may have regarding the information given to you following your procedure. If we do not reach you, we will leave a message.  However, if you are feeling well and you are not experiencing any problems, there is no need to return our call.  We will assume that you have returned to your regular daily activities without incident.  If any biopsies were taken you will be contacted by phone or by letter within the next 1-3 weeks.  Please call us at [phone] if you have not heard about the biopsies in 3 weeks.    SIGNATURES/CONFIDENTIALITY: You and/or your care partner have signed paperwork which will be entered into your electronic medical record.  These signatures attest to the fact that that the information above on your After Visit Summary has been reviewed and is understood.  Full responsibility of the confidentiality of this discharge information lies with you and/or your care-partner. 

## 2017-03-27 NOTE — Progress Notes (Signed)
Called to room to assist during endoscopic procedure.  Patient ID and intended procedure confirmed with present staff. Received instructions for my participation in the procedure from the performing physician.  

## 2017-03-27 NOTE — Progress Notes (Signed)
Pt's states no medical or surgical changes since previsit or office visit. 

## 2017-03-27 NOTE — Progress Notes (Signed)
To recovery, report to McCoy, RN, VSS 

## 2017-03-27 NOTE — Op Note (Signed)
[facility] Patient Name: Edward Berger Procedure Date: 03/27/2017 1:05 PM MRN: [id number] Endoscopist: Remo Lipps P. Armbruster MD, MD Age: 71 Referring MD:  Date of Birth: 1946-03-21 Gender: Male Account #: [id number] Procedure:                Colonoscopy Indications:              Screening for colorectal malignant neoplasm Medicines:                Monitored Anesthesia Care Procedure:                Pre-Anesthesia Assessment:                           - Prior to the procedure, a History and Physical                            was performed, and patient medications and                            allergies were reviewed. The patient's tolerance of                            previous anesthesia was also reviewed. The risks                            and benefits of the procedure and the sedation                            options and risks were discussed with the patient.                            All questions were answered, and informed consent                            was obtained. Prior Anticoagulants: The patient has                            taken no previous anticoagulant or antiplatelet                            agents. ASA Grade Assessment: II - A patient with                            mild systemic disease. After reviewing the risks                            and benefits, the patient was deemed in                            satisfactory condition to undergo the procedure.                           After obtaining informed consent, the colonoscope  was passed under direct vision. Throughout the                            procedure, the patient's blood pressure, pulse, and                            oxygen saturations were monitored continuously. The                            Colonoscope was introduced through the anus and                            advanced to the the cecum, identified by                            appendiceal orifice  and ileocecal valve. The                            colonoscopy was performed without difficulty. The                            patient tolerated the procedure well. The quality                            of the bowel preparation was good. The ileocecal                            valve, appendiceal orifice, and rectum were                            photographed. Scope In: 1:30:10 PM Scope Out: 1:47:06 PM Scope Withdrawal Time: 0 hours 11 minutes 31 seconds  Total Procedure Duration: 0 hours 16 minutes 56 seconds  Findings:                 The perianal and digital rectal examinations were                            normal.                           A 5 mm polyp was found in the transverse colon. The                            polyp was sessile. The polyp was removed with a                            cold snare. Resection and retrieval were complete.                           A few small-mouthed diverticula were found in the                            sigmoid colon.  The exam was otherwise without abnormality. Complications:            No immediate complications. Estimated blood loss:                            Minimal. Estimated Blood Loss:     Estimated blood loss was minimal. Impression:               - One 5 mm polyp in the transverse colon, removed                            with a cold snare. Resected and retrieved.                           - Diverticulosis in the sigmoid colon.                           - The examination was otherwise normal. Recommendation:           - Patient has a contact number available for                            emergencies. The signs and symptoms of potential                            delayed complications were discussed with the                            patient. Return to normal activities tomorrow.                            Written discharge instructions were provided to the                            patient.                            - Resume previous diet.                           - Continue present medications.                           - Await pathology results.                           - Repeat colonoscopy is recommended for                            surveillance. The colonoscopy date will be                            determined after pathology results from today's                            exam become available for review.                           -  No ibuprofen, naproxen, or other non-steroidal                            anti-inflammatory drugs for 2 weeks after polyp                            removal. Remo Lipps P. Armbruster MD, MD 03/27/2017 1:50:43 PM This report has been signed electronically.

## 2017-03-28 ENCOUNTER — Telehealth: Payer: Self-pay | Admitting: *Deleted

## 2017-03-28 NOTE — Telephone Encounter (Signed)
  Follow up Call-  Call back number 03/27/2017  Post procedure Call Back phone  # [phone]  Permission to leave phone message Yes  Some recent data might be hidden     Patient questions:  Do you have a fever, pain , or abdominal swelling? No. Pain Score  0 *  Have you tolerated food without any problems? Yes.    Have you been able to return to your normal activities? Yes.    Do you have any questions about your discharge instructions: Diet   No. Medications  No. Follow up visit  No.  Do you have questions or concerns about your Care? No.  Actions: * If pain score is 4 or above: No action needed, pain <4.

## 2017-03-28 NOTE — Telephone Encounter (Signed)
Opened in error

## 2017-04-04 ENCOUNTER — Encounter: Payer: Self-pay | Admitting: Gastroenterology

## 2017-04-08 DIAGNOSIS — D2371 Other benign neoplasm of skin of right lower limb, including hip: Secondary | ICD-10-CM | POA: Diagnosis not present

## 2017-04-08 DIAGNOSIS — L570 Actinic keratosis: Secondary | ICD-10-CM | POA: Diagnosis not present

## 2017-04-08 DIAGNOSIS — Z85828 Personal history of other malignant neoplasm of skin: Secondary | ICD-10-CM | POA: Diagnosis not present

## 2017-04-08 DIAGNOSIS — D225 Melanocytic nevi of trunk: Secondary | ICD-10-CM | POA: Diagnosis not present

## 2017-04-08 DIAGNOSIS — L821 Other seborrheic keratosis: Secondary | ICD-10-CM | POA: Diagnosis not present

## 2017-04-09 ENCOUNTER — Ambulatory Visit: Payer: PPO | Attending: Internal Medicine | Admitting: Audiology

## 2017-04-09 ENCOUNTER — Encounter: Payer: Self-pay | Admitting: Internal Medicine

## 2017-04-09 DIAGNOSIS — R292 Abnormal reflex: Secondary | ICD-10-CM | POA: Insufficient documentation

## 2017-04-09 DIAGNOSIS — H918X2 Other specified hearing loss, left ear: Secondary | ICD-10-CM | POA: Insufficient documentation

## 2017-04-09 DIAGNOSIS — H903 Sensorineural hearing loss, bilateral: Secondary | ICD-10-CM | POA: Diagnosis not present

## 2017-04-09 DIAGNOSIS — H93299 Other abnormal auditory perceptions, unspecified ear: Secondary | ICD-10-CM | POA: Diagnosis not present

## 2017-04-09 DIAGNOSIS — IMO0001 Reserved for inherently not codable concepts without codable children: Secondary | ICD-10-CM

## 2017-04-09 NOTE — Procedures (Signed)
[facility]  [place], [place] [address]  [phone]   Audiological Evaluation  Patient Name: Edward Berger   Status: Outpatient   DOB: [birth year]    Diagnosis: Hearing Loss                 Date:  04/09/2017     Referent: Marletta Lor, MD  History: Edward Berger was seen for an audiological evaluation.  Primary Concern: Hearing loss over the past few years, poorer on the left side. Wife is concerned about Edward Berger's hearing. Pain: None History of ear infections:  Y - "several as a child" but none recently.  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 History of diabetes: N Family history of hearing loss: N    Evaluation: Conventional pure tone audiometry from 250Hz  - 8000Hz  with using insert earphones.  Hearing Thresholds are better on the right side. 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 Right ear:  Thresholds of 30 dBHL from 250Hz  - 1000Hz ; 40 dBHL at 2000Hz ; 65-70 dBHL from 4000Hz  - 8000Hz . Left ear:    Thresholds of 40-45 dBHL from 250Hz  - 1000Hz ; 55 dBHL at 2000Hz ; 75 dBHL at 4000Hz ; at no response at 8000Hz . Reliability is good Speech reception levels (repeating words near threshold) using recorded spondee word lists:  Right ear: 35 dBHL.  Left ear:  45 dBHL Word recognition using recorded NU-6 word lists, in quiet.  Right ear: 80% at 60 dBHL.  Left ear:   64% at 70 dBHL with 50 dBHL contralateral masking. Word recognition in minimal background noise:  +5 dBHL  Right ear: 50%                              Left ear: 30%  Tympanometry (middle ear function) with normal middle ear pressure and volume.  Right ear: Normal (Type A?) with unusual configuration, with ipsilateral acoustic reflexes that range from 90dB at 500Hz  to  no response at 4000Hz .  Left ear: Normal (Type A) with present acoustic reflexes that range from 95 dB at 500Hz  to 90 dBHL from 1000Hz  - 4000Hz .  Present acoustic reflexes at this level would suggest recruitment but Edward Berger states that although volume is loud, it "doesn't hurt".    CONCLUSION:      Edward Berger needs referral to an ENT because of the poorer hearing and especially word recognition on the left side to rule out retrocochlear pathology. Unsual too is that the better hearing right ear has poorer acoustic reflexes than the left side further requiring ENT evaluation to rule out retrocochlear issues.    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  This amount of hearing loss would adversely affect speech communication at normal conversational speech levels.   Word recognition is good to borderline fair in the right ear and poor in the left ear in quiet at conversational to loud conversational speech levels. In minimal background noise, word recognition drops to poor bilaterally, extremely poor on the left side.   Edward Berger needs further evaluation by  an ENT to rule out medical issues but he may also benefit from amplification; therefore a hearing aid evaluation is recommended. The test results were discussed and Edward Berger counseled.   RECOMMENDATIONS: 1.   Monitor hearing closely with a repeat audiological evaluation in 3-6 months (earlier if there is any change in hearing or ear pressure).  This appointment may be scheduled here or at the ENT. 2.   Referral to an Ear, Nose and Throat physician to further evaluate the high frequency hearing loss that is poorer on the left side. 3.   A hearing aid evaluation - especially for the right ear now, with fitting of the  left ear once medical clearance is obtained.   4.  Strategies that help improve hearing include: A) Face the speaker directly. Optimal is having the speakers face well - lit.  Unless amplified, being within 3-6 feet of the speaker will enhance word recognition. B) Avoid having the speaker back-lit as this will minimize the ability to use cues from lip-reading, facial expression and gestures. C)  Word recognition is poorer in background noise. For optimal word recognition, turn off the TV, radio or noisy fan when engaging in conversation. In a restaurant, try to sit away from noise sources and close to the primary speaker.  D)  Ask for topic clarification from time to time in order to remain in the conversation.  Most people don't mind repeating or clarifying a point when asked.  If needed, explain the difficulty hearing in background noise or hearing loss. 5.  Use hearing protection during noisy activities such as using a weed eater, moving the lawn, shooting, etc.    Musician's plugs, are available from [employer] for music related hearing protection because there is no distortion.  Other hearing protection, such as sponge plugs (available at pharmacies) or earmuffs (available at sporting goods stores or department stores such as Paediatric nurse) are useful for noisy activities and venues.   Deborah L. Heide Spark Au.D., CCC-A Doctor of Audiology 04/09/2017   cc: Marletta Lor, MD

## 2017-04-22 ENCOUNTER — Telehealth: Payer: Self-pay | Admitting: Internal Medicine

## 2017-04-22 DIAGNOSIS — H903 Sensorineural hearing loss, bilateral: Secondary | ICD-10-CM

## 2017-04-22 NOTE — Telephone Encounter (Signed)
Pt calling needing a referral to a ENT since the visit with the audiologist.

## 2017-04-23 NOTE — Telephone Encounter (Signed)
Okay for ENT referral.

## 2017-04-23 NOTE — Telephone Encounter (Signed)
referral was ordered

## 2017-04-23 NOTE — Telephone Encounter (Signed)
Please advise 

## 2017-04-28 DIAGNOSIS — H903 Sensorineural hearing loss, bilateral: Secondary | ICD-10-CM | POA: Diagnosis not present

## 2017-04-28 DIAGNOSIS — H6121 Impacted cerumen, right ear: Secondary | ICD-10-CM | POA: Diagnosis not present

## 2017-06-03 ENCOUNTER — Other Ambulatory Visit: Payer: Self-pay | Admitting: Internal Medicine

## 2017-06-19 ENCOUNTER — Encounter: Payer: Self-pay | Admitting: Internal Medicine

## 2017-07-16 ENCOUNTER — Ambulatory Visit (INDEPENDENT_AMBULATORY_CARE_PROVIDER_SITE_OTHER): Payer: PPO | Admitting: *Deleted

## 2017-07-16 DIAGNOSIS — Z23 Encounter for immunization: Secondary | ICD-10-CM | POA: Diagnosis not present

## 2017-07-16 DIAGNOSIS — H524 Presbyopia: Secondary | ICD-10-CM | POA: Diagnosis not present

## 2017-07-16 DIAGNOSIS — H52203 Unspecified astigmatism, bilateral: Secondary | ICD-10-CM | POA: Diagnosis not present

## 2017-07-16 DIAGNOSIS — H5203 Hypermetropia, bilateral: Secondary | ICD-10-CM | POA: Diagnosis not present

## 2017-08-05 DIAGNOSIS — L570 Actinic keratosis: Secondary | ICD-10-CM | POA: Diagnosis not present

## 2017-09-04 ENCOUNTER — Telehealth: Payer: Self-pay | Admitting: Family Medicine

## 2017-09-04 ENCOUNTER — Ambulatory Visit: Payer: PPO | Admitting: Internal Medicine

## 2017-09-04 ENCOUNTER — Encounter: Payer: Self-pay | Admitting: Internal Medicine

## 2017-09-04 VITALS — BP 106/64 | HR 57 | Temp 97.2°F | Ht 69.0 in | Wt 153.8 lb

## 2017-09-04 DIAGNOSIS — M79662 Pain in left lower leg: Secondary | ICD-10-CM

## 2017-09-04 NOTE — Telephone Encounter (Signed)
[facility] called pt has appointment at 1:00 pm, on 09/05/2017, he is going to check with insurance to make sure test is covered, if not he will cancel appointment and notify PCP.

## 2017-09-04 NOTE — Progress Notes (Signed)
Subjective:    Patient ID: Edward Berger, male    DOB: Jan 22, 1946, 71 y.o.   MRN: [id number]  HPI  71 year old patient who presents with a chief complaint of left calf pain that has been present for several months.  Pain is aggravated by activity.  He has modified his activity level and also has spent some time with stretching but still has pain that interferes with daily activities such as jogging.  No history of trauma he does describe some local tenderness to palpation but no swelling.  No prior history of DVT. There is no history of trauma although the pain in the calf started after an episode of routine running.  Past Medical History:  Diagnosis Date  . Blood transfusion without reported diagnosis    years ago with surgery   . Cervical radiculopathy, chronic      Social History   Socioeconomic History  . Marital status: Married    Spouse name: Not on file  . Number of children: Not on file  . Years of education: Not on file  . Highest education level: Not on file  Social Needs  . Financial resource strain: Not on file  . Food insecurity - worry: Not on file  . Food insecurity - inability: Not on file  . Transportation needs - medical: Not on file  . Transportation needs - non-medical: Not on file  Occupational History  . Not on file  Tobacco Use  . Smoking status: Never Smoker  . Smokeless tobacco: Never Used  Substance and Sexual Activity  . Alcohol use: Yes    Comment: social  . Drug use: No  . Sexual activity: Not on file  Other Topics Concern  . Not on file  Social History Narrative  . Not on file    Past Surgical History:  Procedure Laterality Date  . ANKLE SURGERY     right  . COLONOSCOPY    . ENT     nasal polyp removal   . SHOULDER SURGERY     left    Family History  Problem Relation Age of Onset  . Heart disease Mother   . Cancer Father        prostate  . Colon cancer Neg Hx   . Colon polyps Neg Hx   . Esophageal cancer Neg Hx   .  Rectal cancer Neg Hx   . Stomach cancer Neg Hx     No Known Allergies  Current Outpatient Medications on File Prior to Visit  Medication Sig Dispense Refill  . finasteride (PROPECIA) 1 MG tablet TAKE 1 TABLET BY MOUTH ONCE A DAY 90 tablet 1  . ibuprofen (ADVIL,MOTRIN) 200 MG tablet Take 400 mg by mouth every 6 (six) hours as needed for pain.    . Multiple Vitamin (MULTIVITAMIN) tablet Take 1 tablet by mouth daily.    . Omega-3 Fatty Acids (FISH OIL) 1000 MG CAPS Take 1 capsule by mouth daily.     Current Facility-Administered Medications on File Prior to Visit  Medication Dose Route Frequency Provider Last Rate Last Dose  . 0.9 %  sodium chloride infusion  500 mL Intravenous Continuous Armbruster, Carlota Raspberry, MD        BP 106/64 (BP Location: Left Arm, Patient Position: Sitting, Cuff Size: Normal)   Pulse (!) 57   Temp (!) 97.2 F (36.2 C) (Oral)   Ht 5\' 9"  (1.753 m)   Wt 153 lb 12.8 oz (69.8 kg)   SpO2 98%  BMI 22.71 kg/m     Review of Systems  Constitutional: Negative for appetite change, chills, fatigue and fever.  HENT: Negative for congestion, dental problem, ear pain, hearing loss, sore throat, tinnitus, trouble swallowing and voice change.   Eyes: Negative for pain, discharge and visual disturbance.  Respiratory: Negative for cough, chest tightness, wheezing and stridor.   Cardiovascular: Negative for chest pain, palpitations and leg swelling.  Gastrointestinal: Negative for abdominal distention, abdominal pain, blood in stool, constipation, diarrhea, nausea and vomiting.  Genitourinary: Negative for difficulty urinating, discharge, flank pain, genital sores, hematuria and urgency.  Musculoskeletal: Negative for arthralgias, back pain, gait problem, joint swelling, myalgias and neck stiffness.       Left calf pain  Skin: Negative for rash.  Neurological: Negative for dizziness, syncope, speech difficulty, weakness, numbness and headaches.  Hematological: Negative for  adenopathy. Does not bruise/bleed easily.  Psychiatric/Behavioral: Negative for behavioral problems and dysphoric mood. The patient is not nervous/anxious.        Objective:   Physical Exam  Constitutional: He appears well-developed and well-nourished. No distress.  Musculoskeletal:  Mild tenderness to palpation involving the left calf area No swelling Pedal pulses normal on the left Decrease right dorsalis pedis pulse            Assessment & Plan:   Chronic left calf pain.  Will check a venous Doppler to rule out DVD or other pathology although the yield seems to be low due to the chronicity of his complaint and the fact that it is affecting lifestyle issues.  We will continue gentle stretching.  Patient will try a Ace bandage to the area to see if this alleviates discomfort.  If unimproved and venous Doppler negative will consider sports medicine referral  Nyoka Cowden

## 2017-09-04 NOTE — Patient Instructions (Signed)
Venous Doppler evaluation of the left leg  Call or return to clinic prn if these symptoms worsen or fail to improve as anticipated.  Use a Ace bandage around the left calf area

## 2017-09-05 ENCOUNTER — Inpatient Hospital Stay (HOSPITAL_COMMUNITY): Admission: RE | Admit: 2017-09-05 | Payer: PPO | Source: Ambulatory Visit

## 2017-09-05 NOTE — Telephone Encounter (Signed)
Pt called back, he did cancel the test with cardiovascular imaging.

## 2017-09-08 NOTE — Telephone Encounter (Signed)
FYI

## 2017-09-26 ENCOUNTER — Ambulatory Visit (HOSPITAL_COMMUNITY)
Admission: RE | Admit: 2017-09-26 | Discharge: 2017-09-26 | Disposition: A | Discharge status: Home / Self Care | Payer: PPO | Source: Ambulatory Visit | Attending: Internal Medicine | Admitting: Internal Medicine

## 2017-09-26 DIAGNOSIS — M79662 Pain in left lower leg: Secondary | ICD-10-CM | POA: Diagnosis not present

## 2017-10-13 ENCOUNTER — Telehealth: Payer: Self-pay | Admitting: Family Medicine

## 2017-10-13 NOTE — Telephone Encounter (Signed)
Copied from [place] [id number]. Topic: Inquiry >> Oct 13, 2017 10:25 AM Cecelia Byars, NT wrote: Reason for [id number] called to follow up in regards to a referral ,he had the vein imaging done and is still waiting to hear back from the practice please  [phone]

## 2017-10-14 NOTE — Telephone Encounter (Signed)
Pt wants to get referred to a PT.  Pt states that is what he wanted initially , unless Dr Raliegh Ip has any other ideas. Pt states he already has the results, but it has been over a month since the imaging, and he wants to do something asap.

## 2017-10-15 ENCOUNTER — Other Ambulatory Visit: Payer: Self-pay | Admitting: Internal Medicine

## 2017-10-15 DIAGNOSIS — M79669 Pain in unspecified lower leg: Secondary | ICD-10-CM

## 2017-10-15 NOTE — Telephone Encounter (Signed)
Sir please advise.  

## 2017-10-15 NOTE — Telephone Encounter (Signed)
Please set up for sports medicine referral and make patient aware as soon as possible

## 2017-10-15 NOTE — Telephone Encounter (Signed)
Order placed for  sports medicine referral. Pt was made aware.

## 2017-10-20 ENCOUNTER — Ambulatory Visit: Payer: Self-pay

## 2017-10-20 ENCOUNTER — Encounter: Payer: Self-pay | Admitting: Sports Medicine

## 2017-10-20 ENCOUNTER — Ambulatory Visit (INDEPENDENT_AMBULATORY_CARE_PROVIDER_SITE_OTHER): Payer: PPO | Admitting: Sports Medicine

## 2017-10-20 VITALS — BP 122/8 | HR 68 | Ht 69.0 in | Wt 160.6 lb

## 2017-10-20 DIAGNOSIS — M67972 Unspecified disorder of synovium and tendon, left ankle and foot: Secondary | ICD-10-CM | POA: Diagnosis not present

## 2017-10-20 MED ORDER — NITROGLYCERIN 0.2 MG/HR TD PT24: MEDICATED_PATCH | TRANSDERMAL | 1 refills | Status: DC

## 2017-10-20 NOTE — Procedures (Signed)
PROCEDURE NOTE: THERAPEUTIC EXERCISES (97110) 15 minutes spent for Therapeutic exercises as below and as referenced in the AVS. This included exercises focusing on stretching, strengthening, with significant focus on eccentric aspects.  Proper technique shown and discussed handout in great detail with ATC. All questions were discussed and answered.   Long term goals include an improvement in range of motion, strength, endurance as well as avoiding reinjury. Frequency of visits is one time as determined during today's  office visit. Frequency of exercises to be performed is as per handout.  EXERCISES REVIEWED:  Alfredson exercises  Calf stretching  Cryo submersion

## 2017-10-20 NOTE — Patient Instructions (Signed)
Nitroglycerin Protocol   Apply 1/4 nitroglycerin patch to affected area daily.  Change position of patch within the affected area every 24 hours.  You may experience a headache during the first 1-2 weeks of using the patch, these should subside.  If you experience headaches after beginning nitroglycerin patch treatment, you may take your preferred over the counter pain reliever.  Another side effect of the nitroglycerin patch is skin irritation or rash related to patch adhesive.  Please notify our office if you develop more severe headaches or rash, and stop the patch.  Tendon healing with nitroglycerin patch may require 12 to 24 weeks depending on the extent of injury.  Men should not use if taking Viagra, Cialis, or Levitra.   Do not use if you have migraines or rosacea.    Please perform the exercise program that we have prepared for you and gone over in detail on a daily basis.  In addition to the handout you were provided you can access your program through: [email or web address]   Your unique program code is: [email or web address]

## 2017-10-20 NOTE — Assessment & Plan Note (Addendum)
Chronic Achilles tendinosis/Achilles tendinopathy  nitroglycerin protocol, Compression,  Alfredson protocol Ice and/or cryo-submersion recommended  Therapeutic exercises per procedure note Discussed that the anticipated amount of time for healing is 12- 24 weeks for Tendinopathic changes.  Emphasized the importance of improving blood flow as well as eccentric loading of the tendon.  We will plan to check in in 6 weeks and repeat ultrasound at that time.

## 2017-10-20 NOTE — Procedures (Signed)
LIMITED MSK ULTRASOUND OF left calf Images were obtained and interpreted by myself, Teresa Coombs, DO  Images have been saved and stored to PACS system. Images obtained on: GE S7 Ultrasound machine  FINDINGS:   Left calf musculature is overall normal-appearing at the gastroc musculotendinous junction  Achilles is thickened measuring 0.71 cm in diameter  Muscular tenderness junction of the distal soleus does appear to be slightly irregular consistent with possible chronic soleus strain, question mild strain  IMPRESSION:  1. 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 Questionable chronic soleus strain, mild

## 2017-10-20 NOTE — Progress Notes (Signed)
Edward Berger. [place], [place] at [place]  Edward Berger - 72 y.o. male MRN [id number]  Date of birth: 1945/11/23  Visit Date: 10/20/2017  PCP: Marletta Lor, MD   Referred by: Marletta Lor, MD   Scribe for today's visit: Josepha Pigg, CMA     SUBJECTIVE:  Edward Berger is here for New Patient (Initial Visit) (LT calf pain) .  Referred by: Dr. Royetta Car His LT calf pain symptoms INITIALLY: Began several months ago and MOI is unknown. He does work out regularly.  Described as mild aching, radiating to achilles Worsened with exercise; walking for extended period of time or running Improved with resting Additional associated symptoms include: Pt denies increased swelling, erythema, warmth.     At this time symptoms show no change compared to onset , feels a little better if he doesn't exercise. Feels worse if he tried to go on a walk.  He has been wrapping the calf in ACE wrap. He has tried taking Ibuprofen with minimal relief.   Had doppler 09/26/17 which showed no evidence of DVT.    ROS Denies night time disturbances. Denies fevers, chills, or night sweats. Denies unexplained weight loss. Denies personal history of cancer. Denies changes in bowel or bladder habits. Denies recent unreported falls. Denies new or worsening dyspnea or wheezing. Denies headaches or dizziness.  Denies numbness, tingling or weakness  In the extremities.  Denies dizziness or presyncopal episodes Denies lower extremity edema     HISTORY & PERTINENT PRIOR DATA:  Prior History reviewed and updated per electronic medical record.  Significant history, findings, studies and interim changes include:  reports that  has never smoked. he has never used smokeless tobacco. No results for input(s): HGBA1C, LABURIC, CREATINE in the last 8760 hours. No specialty comments available. Problem  Achilles Tendon Disorder, Left      OBJECTIVE:  VS:  HT:5\' 9"  (175.3 cm)   WT:160 lb 9.6 oz (72.8 kg)  BMI:23.71    BP:(!) 122/8  HR:68bpm  TEMP: ( )  RESP:100 %   PHYSICAL EXAM: Constitutional: WDWN, Non-toxic appearing. Psychiatric: Alert & appropriately interactive.  Not depressed or anxious appearing. Respiratory: No increased work of breathing.  Trachea Midline Eyes: Pupils are equal.  EOM intact without nystagmus.  No scleral icterus  NEUROVASCULAR exam: No clubbing or cyanosis appreciated No significant venous stasis changes Capillary Refill: normal, less than 2 seconds    LOWER Extremities  SWELLING Pre-tibial edema: No significant pretibial edema  PULSES Pedal Pulses: Normal & symmetrically palpable  Left DP and PT pulses 2+/4.  SENSORY Dermatomes intact to light touch  MOTOR Normal strength in all myotomes     Left ankle and foot overall well aligned.  Muscular calf.  He has moderate pain with palpation of the avascular zone of the Achilles.  Dorsiflexion to 95 degrees.  No additional findings.   ASSESSMENT & PLAN:   1. Achilles tendon disorder, left    PLAN:    Achilles tendon disorder, left Chronic Achilles tendinosis/Achilles tendinopathy  nitroglycerin protocol, Compression,  Alfredson protocol Ice and/or cryo-submersion recommended  Therapeutic exercises per procedure note Discussed that the anticipated amount of time for healing is 12- 24 weeks for Tendinopathic changes.  Emphasized the importance of improving blood flow as well as eccentric loading of the tendon.  We will plan to check in in 6 weeks and repeat ultrasound at that time.    ++++++++++++++++++++++++++++++++++++++++++++ Orders & Meds: Orders  Placed This Encounter  Procedures  . Misc procedure  . [place] LIMITED JOINT SPACE STRUCTURES LOW LEFT(NO LINKED CHARGES)    Meds ordered this encounter  Medications  . nitroGLYCERIN (NITRODUR - DOSED IN MG/24 HR) 0.2 mg/hr patch    Sig: Place 1/4 to 1/2 of a patch over affected  region. Remove and replace once daily.  Slightly alter skin placement daily    Dispense:  30 patch    Refill:  1    For musculoskeletal purposes.  Okay to cut patch.    ++++++++++++++++++++++++++++++++++++++++++++ Follow-up: Return in about 6 weeks (around 12/01/2017).   Pertinent documentation may be included in additional procedure notes, imaging studies, problem based documentation and patient instructions. Please see these sections of the encounter for additional information regarding this visit. CMA/ATC served as Education administrator during this visit. History, Physical, and Plan performed by medical provider. Documentation and orders reviewed and attested to.      Gerda Diss, [place] [facility] Physician

## 2017-12-02 ENCOUNTER — Other Ambulatory Visit: Payer: Self-pay | Admitting: Internal Medicine

## 2017-12-02 ENCOUNTER — Ambulatory Visit: Payer: Self-pay

## 2017-12-02 ENCOUNTER — Ambulatory Visit: Payer: PPO | Admitting: Sports Medicine

## 2017-12-02 ENCOUNTER — Encounter: Payer: Self-pay | Admitting: Sports Medicine

## 2017-12-02 VITALS — BP 130/82 | HR 48 | Ht 69.0 in | Wt 157.6 lb

## 2017-12-02 DIAGNOSIS — M4727 Other spondylosis with radiculopathy, lumbosacral region: Secondary | ICD-10-CM

## 2017-12-02 DIAGNOSIS — M79662 Pain in left lower leg: Secondary | ICD-10-CM | POA: Diagnosis not present

## 2017-12-02 DIAGNOSIS — M67972 Unspecified disorder of synovium and tendon, left ankle and foot: Secondary | ICD-10-CM

## 2017-12-02 MED ORDER — GABAPENTIN 300 MG PO CAPS: ORAL_CAPSULE | ORAL | 1 refills | Status: DC

## 2017-12-02 NOTE — Assessment & Plan Note (Addendum)
Patient has fairly classic appearance of Achilles tendinosis but does continue to have fairly significant discomfort with activities greater than soreness localizing more to the calf musculature.  Believe this is likely multifactorial especially seeing the changes within the muscle echotexture that suggest this may be more of a neurogenic etiology.  Will again gabapentin which she is previously taken for cervical spondylosis and done well with again and see how he is able to progress his activity over the next several weeks.  Additionally I would like to send him to chiropractic for evaluation of his back as well as he is away if therapy which is similar to lithotripsy specifically to address the Achilles tendon.  There is some evidence supporting the use of it in this case and patient is aware that this is an out-of-pocket expense.  He has no significant red flag symptoms on exam I am comfortable deferring x-rays at this time given he has no significant back issues.  If any persistent ongoing problems further diagnostic evaluation of his lumbar spine will be pursued.

## 2017-12-02 NOTE — Procedures (Signed)
LIMITED MSK ULTRASOUND OF left calf Images were obtained and interpreted by myself, Teresa Coombs, DO  Images have been saved and stored to PACS system. Images obtained on: GE S7 Ultrasound machine  FINDINGS:   Left calf musculature has decreased echogenicity within the muscular belly likely reflective of dehydration versus neurogenic atrophy  Achilles with fusiform swelling and thickened measuring 0.67cm in diameter Musculotendinous junction of the distal soleus improved  IMPRESSION:  1. Chronic Achilles tendinosis/tendinopathy, previously measuring 0.71 cm in diameter, now 0.67cm 2. Question dehydration versus neurogenic atrophy of the calf musculature.

## 2017-12-02 NOTE — Patient Instructions (Signed)
I recommend you obtained a compression sleeve to help with your joint problems. There are many options on the market however I recommend obtaining an ankle Body Helix compression sleeve.  You can find information (including how to appropriate measure yourself for sizing) can be found at www.Body http://www.lambert.com/.  Many of these products are health savings account (HSA) eligible.   You can use the compression sleeve at any time throughout the day but is most important to use while being active as well as for 2 hours post-activity.   It is appropriate to ice following activity with the compression sleeve in place.

## 2017-12-02 NOTE — Progress Notes (Signed)
Edward Berger. Edward Berger, Edward Berger at [facility] [phone]  Edward Berger - 72 y.o. male MRN [id number]  Date of birth: 1946-08-30  Visit Date: 12/02/2017  PCP: Marletta Lor, MD   Referred by: Marletta Lor, MD   Scribe for today's visit: Josepha Pigg, CMA     SUBJECTIVE:  Edward Berger is here for Follow-up (L achilles pain)  10/20/17: His LT calf pain symptoms INITIALLY: Began several months ago and MOI is unknown. He does work out regularly.  Described as mild aching, radiating to achilles Worsened with exercise; walking for extended period of time or running Improved with resting Additional associated symptoms include: Pt denies increased swelling, erythema, warmth.  At this time symptoms show no change compared to onset , feels a little better if he doesn't exercise. Feels worse if he tried to go on a walk.  He has been wrapping the calf in ACE wrap. He has tried taking Ibuprofen with minimal relief.  Had doppler 09/26/17 which showed no evidence of DVT.   12/02/17: Compared to the last office visit, his previously described symptoms are improving, pain is occurring less often and is less severe.  Current symptoms are mild & are non-radiating.  He has been using Nitroglycerin patches with no side effects, HA, skin irritation. He has been taking IBU prn.   sing ROS Denies night time disturbances. Denies fevers, chills, or night sweats. Denies unexplained weight loss. Denies personal history of cancer. Denies changes in bowel or bladder habits. Denies recent unreported falls. Denies new or worsening dyspnea or wheezing. Denies headaches or dizziness.  Denies numbness, tingling or weakness  In the extremities.  Denies dizziness or presyncopal episodes Denies lower extremity edema    HISTORY & PERTINENT PRIOR DATA:  Prior History reviewed and updated per electronic medical record.  Significant history,  findings, studies and interim changes include:  reports that  has never smoked. he has never used smokeless tobacco. No results for input(s): HGBA1C, LABURIC, CREATINE in the last 8760 hours. No specialty comments available. Problem  Achilles Tendon Disorder, Left    OBJECTIVE:  VS:  HT:5\' 9"  (175.3 cm)   WT:157 lb 9.6 oz (71.5 kg)  BMI:23.26    BP:130/82  HR:(!) 48bpm  TEMP: ( )  RESP:97 %   PHYSICAL EXAM: Constitutional: WDWN, Non-toxic appearing. Psychiatric: Alert & appropriately interactive.  Not depressed or anxious appearing. Respiratory: No increased work of breathing.  Trachea Midline Eyes: Pupils are equal.  EOM intact without nystagmus.  No scleral icterus  NEUROVASCULAR exam: No clubbing or cyanosis appreciated No significant venous stasis changes Capillary Refill: normal, less than 2 seconds   Left calf and Achilles are overall normal in appearance with no significant bruising or swelling.  He has pain with palpation of the avascular zone of the Achilles that is mild in nature.  He has discomfort with calf stretching but has good dorsiflexion to 110 degrees.  Strength is normal.  No pain with straight leg raise.    ASSESSMENT & PLAN:   1. Achilles tendon disorder, left   2. Pain of left calf   3. Osteoarthritis of spine with radiculopathy, lumbosacral region    ++++++++++++++++++++++++++++++++++++++++++++ Orders & Meds:  Orders Placed This Encounter  Procedures  . [place] MSK POCT ULTRASOUND  . Ambulatory referral to Chiropractic    Meds ordered this encounter  Medications  . gabapentin (NEURONTIN) 300 MG capsule    Sig: Start with 1 tab po  qhs X 1 week, then increase to 1 tab po bid X 1 week then 1 tab po tid prn    Dispense:  90 capsule    Refill:  1    ++++++++++++++++++++++++++++++++++++++++++++ PLAN:   Achilles tendon disorder, left Patient has fairly classic appearance of Achilles tendinosis but does continue to have fairly significant discomfort  with activities greater than soreness localizing more to the calf musculature.  Believe this is likely multifactorial especially seeing the changes within the muscle echotexture that suggest this may be more of a neurogenic etiology.  Will again gabapentin which she is previously taken for cervical spondylosis and done well with again and see how he is able to progress his activity over the next several weeks.  Additionally I would like to send him to chiropractic for evaluation of his back as well as he is away if therapy which is similar to lithotripsy specifically to address the Achilles tendon.  There is some evidence supporting the use of it in this case and patient is aware that this is an out-of-pocket expense.  He has no significant red flag symptoms on exam I am comfortable deferring x-rays at this time given he has no significant back issues.  If any persistent ongoing problems further diagnostic evaluation of his lumbar spine will be pursued.    Follow-up: Return in about 6 weeks (around 01/13/2018).   Pertinent documentation may be included in additional procedure notes, imaging studies, problem based documentation and patient instructions. Please see these sections of the encounter for additional information regarding this visit. CMA/ATC served as Education administrator during this visit. History, Physical, and Plan performed by medical provider. Documentation and orders reviewed and attested to.      Gerda Diss, [place] [facility] Physician

## 2017-12-18 DIAGNOSIS — M9905 Segmental and somatic dysfunction of pelvic region: Secondary | ICD-10-CM | POA: Diagnosis not present

## 2017-12-18 DIAGNOSIS — M9903 Segmental and somatic dysfunction of lumbar region: Secondary | ICD-10-CM | POA: Diagnosis not present

## 2017-12-18 DIAGNOSIS — M4716 Other spondylosis with myelopathy, lumbar region: Secondary | ICD-10-CM | POA: Diagnosis not present

## 2017-12-18 DIAGNOSIS — M5442 Lumbago with sciatica, left side: Secondary | ICD-10-CM | POA: Diagnosis not present

## 2018-01-13 ENCOUNTER — Encounter: Payer: Self-pay | Admitting: Sports Medicine

## 2018-01-13 ENCOUNTER — Ambulatory Visit: Payer: PPO | Admitting: Sports Medicine

## 2018-01-13 VITALS — BP 126/78 | HR 58 | Ht 69.0 in | Wt 157.0 lb

## 2018-01-13 DIAGNOSIS — M79662 Pain in left lower leg: Secondary | ICD-10-CM | POA: Diagnosis not present

## 2018-01-13 DIAGNOSIS — M4727 Other spondylosis with radiculopathy, lumbosacral region: Secondary | ICD-10-CM | POA: Diagnosis not present

## 2018-01-13 DIAGNOSIS — M67972 Unspecified disorder of synovium and tendon, left ankle and foot: Secondary | ICD-10-CM

## 2018-01-13 NOTE — Progress Notes (Signed)
Edward Berger. [place], [place] at [facility] [phone]  Edward Berger - 72 y.o. male MRN [id number]  Date of birth: 1946-08-04  Visit Date: 01/13/2018  PCP: Marletta Lor, MD   Referred by: Marletta Lor, MD  Scribe for today's visit: Wendy Poet, LAT, ATC     SUBJECTIVE:  Edward Berger is here for Follow-up (L Achille's tendon disorder) .   10/20/17: His LT calf pain symptoms INITIALLY: Began several months ago and MOI is unknown. He does work out regularly.  Described as mild aching, radiating to achilles Worsened with exercise; walking for extended period of time or running Improved with resting Additional associated symptoms include: Pt denies increased swelling, erythema, warmth.  At this time symptoms show no change compared to onset , feels a little better if he doesn't exercise. Feels worse if he tried to go on a walk.  He has been wrapping the calf in ACE wrap. He has tried taking Ibuprofen with minimal relief.  Had doppler 09/26/17 which showed no evidence of DVT.   12/02/17: Compared to the last office visit, his previously described symptoms are improving, pain is occurring less often and is less severe.  Current symptoms are mild & are non-radiating.  He has been using Nitroglycerin patches with no side effects, HA, skin irritation. He has been taking IBU prn.   01/13/18: Compared to the last office visit on 12/02/17, his previously described L Achille's tendon symptoms are improving w/ decreased intensity and frequency of L Achille's pain.  He states that he had a bad experience at [facility] chiro due to have increased back pain and pain into B LEs the day after his appt. Current symptoms in his L Achille's are mild & are nonradiating He has been doing his Alfredson's exercises and HEP.  He has been using the nitorglycerin patches and is taking IBU and Gabapentin (300 mg).  He completed one session at  [facility].  ROS Denies night time disturbances. Denies fevers, chills, or night sweats. Denies unexplained weight loss. Denies personal history of cancer. Denies changes in bowel or bladder habits. Denies recent unreported falls. Denies new or worsening dyspnea or wheezing. Denies headaches or dizziness.  Denies numbness, tingling or weakness  In the extremities.  Denies dizziness or presyncopal episodes Denies lower extremity edema    HISTORY & PERTINENT PRIOR DATA:  Prior History reviewed and updated per electronic medical record.  Significant/pertinent history, findings, studies include:  reports that he has never smoked. He has never used smokeless tobacco. No results for input(s): HGBA1C, LABURIC, CREATINE in the last 8760 hours. No specialty comments available. No problems updated.  OBJECTIVE:  VS:  HT:5\' 9"  (175.3 cm)   WT:157 lb (71.2 kg)  BMI:23.17    BP:126/78  HR:(Abnormal) 58bpm  TEMP: ( )  RESP:96 %   PHYSICAL EXAM: Constitutional: WDWN, Non-toxic appearing. Psychiatric: Alert & appropriately interactive.  Not depressed or anxious appearing. Respiratory: No increased work of breathing.  Trachea Midline Eyes: Pupils are equal.  EOM intact without nystagmus.  No scleral icterus  Vascular Exam: warm to touch no edema  lower extremity neuro exam: unremarkable normal strength normal sensation  MSK Exam: Bilateral lower extremities overall well aligned.  Mild CTP over the left mid Achilles this is minimal.  No pain with calf squeeze.  Normal plantarflexion and dorsiflexion strength with normal heel toe walking.  Negative straight leg raise bilaterally.   ASSESSMENT & PLAN:  1. Achilles tendon disorder, left   2. Pain of left calf   3. Osteoarthritis of spine with radiculopathy, lumbosacral region     PLAN: Overall he is doing better.  We will have him continue with current plan at this time including with gabapentin as prescribed.  Continue with  nitroglycerin patches until he is finished with this prescription and discontinue at that time.  If persistent symptoms after discontinuation can refill this prior to follow-up visit.  If any worsening symptoms she will follow-up sooner.  Follow-up 3 months for repeat clinical exam/?msk [place]  Follow-up: Return in about 3 months (around 04/14/2018).        Please see additional documentation for Objective, Assessment and Plan sections. Pertinent additional documentation may be included in corresponding procedure notes, imaging studies, problem based documentation and patient instructions. Please see these sections of the encounter for additional information regarding this visit.  CMA/ATC served as Education administrator during this visit. History, Physical, and Plan performed by medical provider. Documentation and orders reviewed and attested to.      Gerda Diss, [place] [facility] Physician

## 2018-01-13 NOTE — Patient Instructions (Signed)

## 2018-02-06 ENCOUNTER — Ambulatory Visit (INDEPENDENT_AMBULATORY_CARE_PROVIDER_SITE_OTHER): Payer: PPO | Admitting: Family Medicine

## 2018-02-06 ENCOUNTER — Encounter: Payer: Self-pay | Admitting: Family Medicine

## 2018-02-06 ENCOUNTER — Ambulatory Visit: Payer: PPO | Admitting: Family Medicine

## 2018-02-06 VITALS — BP 108/70 | HR 57 | Temp 97.5°F | Wt 157.0 lb

## 2018-02-06 DIAGNOSIS — H6123 Impacted cerumen, bilateral: Secondary | ICD-10-CM

## 2018-02-06 NOTE — Progress Notes (Signed)
Subjective:    Patient ID: Edward Berger, male    DOB: [birth year], 72 y.o.   MRN: [id number]  No chief complaint on file.   HPI Patient was seen today for acute concern.  Earwax buildup.  Pt states he is here to have his ears cleaned.  Pt wears bilateral hearing aids which contributes to his earwax buildup.  Pt is drinking plenty of water per day.  Pt denies headaches, fever, chills, nausea vomiting, ear pressure/pain.  Past Medical History:  Diagnosis Date  . Blood transfusion without reported diagnosis    years ago with surgery   . Cervical radiculopathy, chronic     No Known Allergies  ROS General: Denies fever, chills, night sweats, changes in weight, changes in appetite HEENT: Denies headaches, ear pain, changes in vision, rhinorrhea, sore throat  +cerumen impaction CV: Denies CP, palpitations, SOB, orthopnea Pulm: Denies SOB, cough, wheezing GI: Denies abdominal pain, nausea, vomiting, diarrhea, constipation GU: Denies dysuria, hematuria, frequency, vaginal discharge Msk: Denies muscle cramps, joint pains Neuro: Denies weakness, numbness, tingling Skin: Denies rashes, bruising Psych: Denies depression, anxiety, hallucinations     Objective:    Blood pressure 108/70, pulse (!) 57, temperature (!) 97.5 F (36.4 C), temperature source Oral, weight 157 lb (71.2 kg), SpO2 97 %.   Gen. Pleasant, well-nourished, in no distress, normal affect   HEENT: wearing glasses, [place]/AT, face symmetric, no scleral icterus, PERRLA, nares patent without drainage.  Bilateral ear canals occluded with cerumen.  TMs normal after irrigation.  Some of water remaining in left canal.  Cotton placed in bilateral ears. Lungs: no accessory muscle use Cardiovascular: RRR, no peripheral edema Neuro:  A&Ox3, CN II-XII intact, normal gait Skin:  Warm, no lesions/ rash   Wt Readings from Last 3 Encounters:  02/06/18 157 lb (71.2 kg)  01/13/18 157 lb (71.2 kg)  12/02/17 157 lb 9.6 oz (71.5 kg)     Lab Results  Component Value Date   WBC 6.7 01/15/2017   HGB 14.3 01/15/2017   HCT 42.3 01/15/2017   PLT 229.0 01/15/2017   GLUCOSE 104 (H) 01/15/2017   CHOL 185 01/15/2017   TRIG 66.0 01/15/2017   HDL 57.50 01/15/2017   LDLCALC 114 (H) 01/15/2017   ALT 14 01/15/2017   AST 17 01/15/2017   NA 141 01/15/2017   K 4.3 01/15/2017   CL 104 01/15/2017   CREATININE 1.31 01/15/2017   BUN 19 01/15/2017   CO2 30 01/15/2017   TSH 1.13 01/15/2017   PSA 0.27 09/26/2014    Assessment/Plan:  Bilateral impacted cerumen  -Consent obtained.  Bilateral ears irrigated.  Patient tolerated procedure well. -Patient encouraged to continue drinking plenty of water daily and consider using Debrox eardrops weekly. -Follow-up PRN  Grier Mitts, MD

## 2018-02-06 NOTE — Patient Instructions (Signed)
Earwax Buildup, Adult The ears produce a substance called earwax that helps keep bacteria out of the ear and protects the skin in the ear canal. Occasionally, earwax can build up in the ear and cause discomfort or hearing loss. What increases the risk? This condition is more likely to develop in people who:  Are male.  Are elderly.  Naturally produce more earwax.  Clean their ears often with cotton swabs.  Use earplugs often.  Use in-ear headphones often.  Wear hearing aids.  Have narrow ear canals.  Have earwax that is overly thick or sticky.  Have eczema.  Are dehydrated.  Have excess hair in the ear canal.  What are the signs or symptoms? Symptoms of this condition include:  Reduced or muffled hearing.  A feeling of fullness in the ear or feeling that the ear is plugged.  Fluid coming from the ear.  Ear pain.  Ear itch.  Ringing in the ear.  Coughing.  An obvious piece of earwax that can be seen inside the ear canal.  How is this diagnosed? This condition may be diagnosed based on:  Your symptoms.  Your medical history.  An ear exam. During the exam, your health care provider will look into your ear with an instrument called an otoscope.  You may have tests, including a hearing test. How is this treated? This condition may be treated by:  Using ear drops to soften the earwax.  Having the earwax removed by a health care provider. The health care provider may: ? Flush the ear with water. ? Use an instrument that has a loop on the end (curette). ? Use a suction device.  Surgery to remove the wax buildup. This may be done in severe cases.  Follow these instructions at home:  Take over-the-counter and prescription medicines only as told by your health care provider.  Do not put any objects, including cotton swabs, into your ear. You can clean the opening of your ear canal with a washcloth or facial tissue.  Follow instructions from your health  care provider about cleaning your ears. Do not over-clean your ears.  Drink enough fluid to keep your urine clear or pale yellow. This will help to thin the earwax.  Keep all follow-up visits as told by your health care provider. If earwax builds up in your ears often or if you use hearing aids, consider seeing your health care provider for routine, preventive ear cleanings. Ask your health care provider how often you should schedule your cleanings.  If you have hearing aids, clean them according to instructions from the manufacturer and your health care provider. Contact a health care provider if:  You have ear pain.  You develop a fever.  You have blood, pus, or other fluid coming from your ear.  You have hearing loss.  You have ringing in your ears that does not go away.  Your symptoms do not improve with treatment.  You feel like the room is spinning (vertigo). Summary  Earwax can build up in the ear and cause discomfort or hearing loss.  The most common symptoms of this condition include reduced or muffled hearing and a feeling of fullness in the ear or feeling that the ear is plugged.  This condition may be diagnosed based on your symptoms, your medical history, and an ear exam.  This condition may be treated by using ear drops to soften the earwax or by having the earwax removed by a health care provider.  Do   not put any objects, including cotton swabs, into your ear. You can clean the opening of your ear canal with a washcloth or facial tissue. This information is not intended to replace advice given to you by your health care provider. Make sure you discuss any questions you have with your health care provider. Document Released: 10/24/2004 Document Revised: 11/27/2016 Document Reviewed: 11/27/2016 Elsevier Interactive Patient Education  2018 [place] Drops, Adult You have been diagnosed with a condition that requires you to put drops of medicine into your  ears. Ear drops are a medicine that is placed in the ear. This sheet gives you information about how to use ear drops. Your health care provider may also give you more specific instructions. Supplies needed:  Cotton ball.  Medicine. How to put ear drops into your ear 1. Wash your hands thoroughly with soap and water. 2. Make sure your ears are clean and dry. If there is any ear wax or drainage at the outermost portion of the ear canal, wipe it out gently with a cotton-tipped applicator. 3. Warm up the medicine by holding it in the palm of your hand for a few minutes. 4. Shake the medicine if it is a suspension. 5. Use the dropper to draw up the medicine. 6. Hold the dropper above your ear canal and put the drops in the affected ear as instructed. Do not put the dropper into your ear at any time. It may help to pull the outer flap of the ear up and back while you put the drops in. Doing this will straighten out the ear canal so the medicine can get into the canal easier. 7. To make sure your ear soaks up the medicine, do either of these things: ? Lie down with the affected ear facing up for 10 minutes. This will cause the drops to stay in the ear canal and run down and fill the canal. ? Gently put a cotton ball in your ear canal. Leave enough of the cotton ball out so it can be easily removed. Do not push the cotton ball down into your ear with a cotton-tipped swab or other instrument. You can remove the cotton ball once the medicine has been absorbed. 8. If both ears need the drops, repeat the procedure for the other ear. Your health care provider will let you know if you need to put drops in both ears. Follow these instructions at home:  Use the ear drops for as long as directed by your health care provider, even if you begin to feel better.  Always wash your hands before and after handling the ear drops.  Keep the ear drops at room temperature.  Keep all follow-up visits as told by your  health care provider. This is important. Contact a health care provider if:  Your condition gets worse.  Your pain gets worse.  You notice any unusual drainage from your ear, especially if the drainage has a bad smell.  You have trouble hearing.  You have used the ear drops for the amount of time recommended by your health care provider, but your symptoms have not improved. Get help right away if:  You experience a form of dizziness in which you feel as if the room is spinning and you feel nauseated (vertigo).  The outside of your ear becomes red or swollen.  You develop a severe headache with or without neck stiffness. Summary  Ear drops are a medicine that is placed in the ear.  Put drops in the affected ear as instructed.  Use the ear drops for as long as directed by your health care provider, even if your symptoms begin to get better.  Keep all follow-up visits as told by your health care provider. This is important. This information is not intended to replace advice given to you by your health care provider. Make sure you discuss any questions you have with your health care provider. Document Released: 09/10/2001 Document Revised: 09/19/2016 Document Reviewed: 09/19/2016 Elsevier Interactive Patient Education  2017 [employer].

## 2018-03-10 ENCOUNTER — Other Ambulatory Visit: Payer: Self-pay | Admitting: Internal Medicine

## 2018-04-28 ENCOUNTER — Ambulatory Visit (INDEPENDENT_AMBULATORY_CARE_PROVIDER_SITE_OTHER): Payer: PPO | Admitting: Sports Medicine

## 2018-04-28 ENCOUNTER — Encounter: Payer: Self-pay | Admitting: Sports Medicine

## 2018-04-28 VITALS — BP 112/70 | HR 54 | Ht 69.0 in | Wt 157.6 lb

## 2018-04-28 DIAGNOSIS — M67972 Unspecified disorder of synovium and tendon, left ankle and foot: Secondary | ICD-10-CM | POA: Diagnosis not present

## 2018-04-28 DIAGNOSIS — M79662 Pain in left lower leg: Secondary | ICD-10-CM

## 2018-04-28 DIAGNOSIS — R269 Unspecified abnormalities of gait and mobility: Secondary | ICD-10-CM | POA: Diagnosis not present

## 2018-04-28 DIAGNOSIS — M4727 Other spondylosis with radiculopathy, lumbosacral region: Secondary | ICD-10-CM

## 2018-04-28 NOTE — Patient Instructions (Signed)
I recommend that you obtain over-the-counter SOLE  medium cushioned insoles.  These can be found at [employer] - or on-line at [employer]  Search for "SOLE Active Medium Shoe Insoles"

## 2018-04-28 NOTE — Progress Notes (Signed)
Edward Berger. Edward Berger, Edward Berger at [facility] [phone]  Edward Berger - 72 y.o. male MRN [id number]  Date of birth: 06-Apr-1946  Visit Date: 04/28/2018  PCP: Edward Lor, MD   Referred by: Edward Lor, MD  Scribe(s) for today's visit: Edward Berger, CMA  SUBJECTIVE:  Edward Berger is here for Follow-up (L achilles pain)   10/20/17: His LT calf pain symptoms INITIALLY: Began several months ago and MOI is unknown. He does work out regularly.  Described as mild aching, radiating to achilles Worsened with exercise; walking for extended period of time or running Improved with resting Additional associated symptoms include: Pt denies increased swelling, erythema, warmth.  At this time symptoms show no change compared to onset , feels a little better if he doesn't exercise. Feels worse if he tried to go on a walk.  He has been wrapping the calf in ACE wrap. He has tried taking Ibuprofen with minimal relief.  Had doppler 09/26/17 which showed no evidence of DVT.   12/02/17: Compared to the last office visit, his previously described symptoms are improving, pain is occurring less often and is less severe.  Current symptoms are mild & are non-radiating.  He has been using Nitroglycerin patches with no side effects, HA, skin irritation. He has been taking IBU prn.   01/13/18: Compared to the last office visit on 12/02/17, his previously described L Achille's tendon symptoms are improving w/ decreased intensity and frequency of L Achille's pain.  He states that he had a bad experience at [facility] due to have increased back pain and pain into B LEs the day after his appt. Current symptoms in his L Achille's are mild & are nonradiating He has been doing his Alfredson's exercises and HEP.  He has been using the nitorglycerin patches and is taking IBU and Gabapentin (300 mg).  He completed one session at [facility].  04/28/2018: Compared to the last office visit, his previously described symptoms are improving. He does still have some pain if he tried to exercise without stretching.  Current symptoms are mild & are nonradiating He has been taking IBU prn. He ha sd/c Nitroglycerin Patches and Gabapentin. He has been HEP and Alfredson's exercises with no trouble. He has been wearing Body Helix compression sleeve if walking a long distance.    REVIEW OF SYSTEMS: Denies night time disturbances. Denies fevers, chills, or night sweats. Denies unexplained weight loss. Denies personal history of cancer. Denies changes in bowel or bladder habits. Denies recent unreported falls. Denies new or worsening dyspnea or wheezing. Denies headaches or dizziness.  Denies numbness, tingling or weakness  In the extremities.  Denies dizziness or presyncopal episodes Denies lower extremity edema    HISTORY & PERTINENT PRIOR DATA:  Prior History reviewed and updated per electronic medical record.  Significant/pertinent history, findings, studies include:  reports that he has never smoked. He has never used smokeless tobacco. No results for input(s): HGBA1C, LABURIC, CREATINE in the last 8760 hours. No specialty comments available. No problems updated.  OBJECTIVE:  VS:  HT:5\' 9"  (175.3 cm)   WT:157 lb 9.6 oz (71.5 kg)  BMI:23.26    BP:112/70  HR:(Abnormal) 54bpm  TEMP: ( )  RESP:96 %   PHYSICAL EXAM: Constitutional: WDWN, Non-toxic appearing. Psychiatric: Alert & appropriately interactive.  Not depressed or anxious appearing. Respiratory: No increased work of breathing.  Trachea Midline Eyes: Pupils are equal.  EOM intact without nystagmus.  No scleral icterus  Vascular Exam: warm to touch no edema  lower extremity neuro exam: unremarkable normal strength normal sensation  MSK Exam: Left ankle and Achilles are overall in better alignment with a mild amount of pain with Achilles squeeze test  associated with avascular zone.  No appreciable thickening or nodularity.  This does feel improved compared to the past.  He has good dorsiflexion to 110 degrees in a straight knee position.  He walks with a slightly antalgic gait with a overpronation of the left foot compared to the right which is neutral.  He does have slightly uneven shoewear.   ASSESSMENT & PLAN:   1. Achilles tendon disorder, left   2. Pain of left calf   3. Osteoarthritis of spine with radiculopathy, lumbosacral region   4. Gait disturbance     PLAN: Overall he is 95% improved we will have him continue with home therapeutic exercises and he has discontinued his medicines this point.  We discussed that this will continue to slowly improve and get better and if any lack of improvement or worsening symptoms are always happy and back at this point he will follow-up as needed.  I did recommend obtaining over-the-counter insoles to see if that helps with his slight gait abnormality.  Can consider custom cushion insoles in the future if needed.    Follow-up: Return if symptoms worsen or fail to improve.     Please see additional documentation for Objective, Assessment and Plan sections. Pertinent additional documentation may be included in corresponding procedure notes, imaging studies, problem based documentation and patient instructions. Please see these sections of the encounter for additional information regarding this visit.  CMA/ATC served as Education administrator during this visit. History, Physical, and Plan performed by medical provider. Documentation and orders reviewed and attested to.      Edward Berger, [facility] Physician

## 2018-06-08 DIAGNOSIS — H04123 Dry eye syndrome of bilateral lacrimal glands: Secondary | ICD-10-CM | POA: Diagnosis not present

## 2018-06-08 DIAGNOSIS — H1132 Conjunctival hemorrhage, left eye: Secondary | ICD-10-CM | POA: Diagnosis not present

## 2018-06-11 DIAGNOSIS — L570 Actinic keratosis: Secondary | ICD-10-CM | POA: Diagnosis not present

## 2018-06-11 DIAGNOSIS — D225 Melanocytic nevi of trunk: Secondary | ICD-10-CM | POA: Diagnosis not present

## 2018-06-11 DIAGNOSIS — D485 Neoplasm of uncertain behavior of skin: Secondary | ICD-10-CM | POA: Diagnosis not present

## 2018-06-11 DIAGNOSIS — L821 Other seborrheic keratosis: Secondary | ICD-10-CM | POA: Diagnosis not present

## 2018-06-11 DIAGNOSIS — Z85828 Personal history of other malignant neoplasm of skin: Secondary | ICD-10-CM | POA: Diagnosis not present

## 2018-06-11 DIAGNOSIS — D2371 Other benign neoplasm of skin of right lower limb, including hip: Secondary | ICD-10-CM | POA: Diagnosis not present

## 2018-06-12 ENCOUNTER — Other Ambulatory Visit: Payer: Self-pay | Admitting: Internal Medicine

## 2018-07-10 ENCOUNTER — Ambulatory Visit (INDEPENDENT_AMBULATORY_CARE_PROVIDER_SITE_OTHER): Payer: PPO

## 2018-07-10 DIAGNOSIS — Z23 Encounter for immunization: Secondary | ICD-10-CM

## 2018-09-15 ENCOUNTER — Other Ambulatory Visit: Payer: Self-pay | Admitting: Internal Medicine

## 2018-09-18 NOTE — Telephone Encounter (Signed)
Copied from [place] [id number]. Topic: General - Other >> Sep 18, 2018  1:37 PM Yvette Rack wrote: Reason for CRM: Pt stated that he has been trying to get the finasteride (PROPECIA) 1 MG tablet refilled since Monday 09/14/18 and the pharmacy advised him that no refill request has been received. Chart shows refill request sent on 09/15/18. Pt stated he only has 1 pill remaining and he would like a call back. Spoke with nurse triage and was told to send to office. Cb# [phone]

## 2018-09-18 NOTE — Telephone Encounter (Signed)
Patient calling to check the status of this. Patient said he will be calling back before 5pm if he does not get a call from nurse. Please advise.

## 2018-10-16 ENCOUNTER — Ambulatory Visit (INDEPENDENT_AMBULATORY_CARE_PROVIDER_SITE_OTHER): Payer: PPO | Admitting: Internal Medicine

## 2018-10-16 VITALS — BP 116/72 | HR 60 | Temp 97.8°F | Ht 69.0 in | Wt 162.0 lb

## 2018-10-16 DIAGNOSIS — H6123 Impacted cerumen, bilateral: Secondary | ICD-10-CM | POA: Diagnosis not present

## 2018-10-16 NOTE — Patient Instructions (Addendum)
-It was nice meeting you today!  -Please return in 6 months for you annual physical. Come in fasting to that appointment.   Earwax Buildup, Adult The ears produce a substance called earwax that helps keep bacteria out of the ear and protects the skin in the ear canal. Occasionally, earwax can build up in the ear and cause discomfort or hearing loss. What increases the risk? This condition is more likely to develop in people who:  Are male.  Are elderly.  Naturally produce more earwax.  Clean their ears often with cotton swabs.  Use earplugs often.  Use in-ear headphones often.  Wear hearing aids.  Have narrow ear canals.  Have earwax that is overly thick or sticky.  Have eczema.  Are dehydrated.  Have excess hair in the ear canal. What are the signs or symptoms? Symptoms of this condition include:  Reduced or muffled hearing.  A feeling of fullness in the ear or feeling that the ear is plugged.  Fluid coming from the ear.  Ear pain.  Ear itch.  Ringing in the ear.  Coughing.  An obvious piece of earwax that can be seen inside the ear canal. How is this diagnosed? This condition may be diagnosed based on:  Your symptoms.  Your medical history.  An ear exam. During the exam, your health care provider will look into your ear with an instrument called an otoscope. You may have tests, including a hearing test. How is this treated? This condition may be treated by:  Using ear drops to soften the earwax.  Having the earwax removed by a health care provider. The health care provider may: ? Flush the ear with water. ? Use an instrument that has a loop on the end (curette). ? Use a suction device.  Surgery to remove the wax buildup. This may be done in severe cases. Follow these instructions at home:   Take over-the-counter and prescription medicines only as told by your health care provider.  Do not put any objects, including cotton swabs, into your  ear. You can clean the opening of your ear canal with a washcloth or facial tissue.  Follow instructions from your health care provider about cleaning your ears. Do not over-clean your ears.  Drink enough fluid to keep your urine clear or pale yellow. This will help to thin the earwax.  Keep all follow-up visits as told by your health care provider. If earwax builds up in your ears often or if you use hearing aids, consider seeing your health care provider for routine, preventive ear cleanings. Ask your health care provider how often you should schedule your cleanings.  If you have hearing aids, clean them according to instructions from the manufacturer and your health care provider. Contact a health care provider if:  You have ear pain.  You develop a fever.  You have blood, pus, or other fluid coming from your ear.  You have hearing loss.  You have ringing in your ears that does not go away.  Your symptoms do not improve with treatment.  You feel like the room is spinning (vertigo). Summary  Earwax can build up in the ear and cause discomfort or hearing loss.  The most common symptoms of this condition include reduced or muffled hearing and a feeling of fullness in the ear or feeling that the ear is plugged.  This condition may be diagnosed based on your symptoms, your medical history, and an ear exam.  This condition may be treated  by using ear drops to soften the earwax or by having the earwax removed by a health care provider.  Do not put any objects, including cotton swabs, into your ear. You can clean the opening of your ear canal with a washcloth or facial tissue. This information is not intended to replace advice given to you by your health care provider. Make sure you discuss any questions you have with your health care provider. Document Released: 10/24/2004 Document Revised: 08/28/2017 Document Reviewed: 11/27/2016 Elsevier Interactive Patient Education  2019  [place] Irrigation What is ear irrigation? Ear irrigation is a procedure to wash dirt and wax out of your ear canal. This procedure is also called lavage. You may need ear irrigation if you are having trouble hearing because of a buildup of earwax. You may also have ear irrigation as part of the treatment for an ear infection. Getting wax and dirt out of your ear canal can help some medicines given as ear drops work better. How is ear irrigation performed? The procedure may vary among health care providers and hospitals. You may be given ear drops to put in your ear 15-20 minutes before irrigation. This helps loosen the wax. Then, a syringe containing water and a sterile salt solution (saline) can be gently inserted into the ear canal. The saline is used to flush out wax and other debris. Ear irrigation kits are also available to use at home. Ask your health care provider if this is an option for you. Use a home irrigation kit only as told by your health care provider. Read the package instructions carefully. Follow the directions for using the syringe. Use water that is room temperature. Do not do ear irrigation at home if you:  Have diabetes. Diabetes increases the risk of infection.  Have a hole or tear in your eardrum.  Have tubes in your ears. What are the risks of ear irrigation? Generally, this is a safe procedure. However, problems may occur, including:  Infection.  Pain.  Hearing loss.  Pushing water and debris into the eardrum. This can occur if there are holes in the eardrum.  Ear irrigation failing to work. How should I care for my ears after having them irrigated? Cleaning   Clean the outside of your ear with a soft washcloth daily.  If told by your health care provider, use a few drops of baby oil, mineral oil, glycerin, hydrogen peroxide, or over-the-counter earwax softening drops.  Do not use cotton swabs to clean your ears. These can push wax down into  the ear canal.  Do not put anything into your ears to try to remove wax. This includes ear candles. General Instructions  Take over-the-counter and prescription medicines only as told by your health care provider.  If you were prescribed an antibiotic medicine, use it as told by your health care provider. Do not stop using the antibiotic even if your condition improves.  Keep all follow-up visits as told by your health care provider. This is important.  Visit your health care provider at least once a year to have your ears and hearing checked. When should I seek medical care? Seek medical care if:  Your hearing is not improving or is getting worse.  You have pain or redness in your ear.  You have fluid, blood, or pus coming out of your ear. This information is not intended to replace advice given to you by your health care provider. Make sure you discuss any questions  you have with your health care provider. Document Released: 10/13/2015 Document Revised: 04/30/2017 Document Reviewed: 02/23/2015 Elsevier Interactive Patient Education  2019 [employer].

## 2018-10-16 NOTE — Progress Notes (Signed)
Established Patient Office Visit     CC/Reason for Visit: Establish care, ear wax buildup  HPI: Edward Berger is a 73 y.o. male who is coming in today for the above mentioned reasons.  Due for annual physical in the fall.  He has no past medical history of significance.  He comes in today because he has noticed a buildup of wax in his left ear that is not improving despite using Debrox.  He uses hearing aids, has not had any ear pain.   Past Medical/Surgical History: Past Medical History:  Diagnosis Date  . Blood transfusion without reported diagnosis    years ago with surgery   . Cervical radiculopathy, chronic     Past Surgical History:  Procedure Laterality Date  . ANKLE SURGERY     right  . COLONOSCOPY    . ENT     nasal polyp removal   . SHOULDER SURGERY     left    Social History:  reports that he has never smoked. He has never used smokeless tobacco. He reports current alcohol use. He reports that he does not use drugs.  Allergies: No Known Allergies  Family History:  Family History  Problem Relation Age of Onset  . Heart disease Mother   . Cancer Father        prostate  . Colon cancer Neg Hx   . Colon polyps Neg Hx   . Esophageal cancer Neg Hx   . Rectal cancer Neg Hx   . Stomach cancer Neg Hx      Current Outpatient Medications:  .  finasteride (PROPECIA) 1 MG tablet, TAKE ONE TABLET BY MOUTH ONE TIME DAILY, Disp: 90 tablet, Rfl: 0 .  ibuprofen (ADVIL,MOTRIN) 200 MG tablet, Take 400 mg by mouth every 6 (six) hours as needed for pain., Disp: , Rfl:  .  Multiple Vitamin (MULTIVITAMIN) tablet, Take 1 tablet by mouth daily., Disp: , Rfl:  .  nitroGLYCERIN (NITRODUR - DOSED IN MG/24 HR) 0.2 mg/hr patch, Place 1/4 to 1/2 of a patch over affected region. Remove and replace once daily.  Slightly alter skin placement daily, Disp: 30 patch, Rfl: 1 .  Omega-3 Fatty Acids (FISH OIL) 1000 MG CAPS, Take 1 capsule by mouth daily., Disp: , Rfl:  .   gabapentin (NEURONTIN) 300 MG capsule, Start with 1 tab po qhs X 1 week, then increase to 1 tab po bid X 1 week then 1 tab po tid prn (Patient not taking: Reported on 04/28/2018), Disp: 90 capsule, Rfl: 1  Review of Systems:  Constitutional: Denies fever, chills, diaphoresis, appetite change and fatigue.  HEENT: Denies photophobia, eye pain, redness, hearing loss, ear pain, congestion, sore throat, rhinorrhea, sneezing, mouth sores, trouble swallowing, neck pain, neck stiffness and tinnitus.   Respiratory: Denies SOB, DOE, cough, chest tightness,  and wheezing.   Cardiovascular: Denies chest pain, palpitations and leg swelling.    Physical Exam: Vitals:   10/16/18 0938  BP: 116/72  Pulse: 60  Temp: 97.8 F (36.6 C)  TempSrc: Oral  SpO2: 99%  Weight: 162 lb (73.5 kg)  Height: '5\' 9"'$  (1.753 m)    Body mass index is 23.92 kg/m.   Constitutional: NAD, calm, comfortable Eyes: PERRL, lids and conjunctivae normal ENMT: Mucous membranes are moist. Tympanic membrane is not visible bilaterally due to cerumen impaction  Respiratory: clear to auscultation bilaterally, no wheezing, no crackles. Normal respiratory effort. No accessory muscle use.  Cardiovascular: Regular rate and rhythm, no murmurs /  rubs / gallops. No extremity edema. 2+ pedal pulses. No carotid bruits.  Psychiatric: Normal judgment and insight. Alert and oriented x 3. Normal mood.    Impression and Plan:  Bilateral impacted cerumen -Both ears have been irrigated today, he tolerated the procedure well. -He has been encouraged to continue drinking plenty of water daily and continue use of Debrox.  He will follow-up as needed.       Patient Instructions  -It was nice meeting you today!  -Please return in 6 months for you annual physical. Come in fasting to that appointment.   Earwax Buildup, Adult The ears produce a substance called earwax that helps keep bacteria out of the ear and protects the skin in the ear  canal. Occasionally, earwax can build up in the ear and cause discomfort or hearing loss. What increases the risk? This condition is more likely to develop in people who:  Are male.  Are elderly.  Naturally produce more earwax.  Clean their ears often with cotton swabs.  Use earplugs often.  Use in-ear headphones often.  Wear hearing aids.  Have narrow ear canals.  Have earwax that is overly thick or sticky.  Have eczema.  Are dehydrated.  Have excess hair in the ear canal. What are the signs or symptoms? Symptoms of this condition include:  Reduced or muffled hearing.  A feeling of fullness in the ear or feeling that the ear is plugged.  Fluid coming from the ear.  Ear pain.  Ear itch.  Ringing in the ear.  Coughing.  An obvious piece of earwax that can be seen inside the ear canal. How is this diagnosed? This condition may be diagnosed based on:  Your symptoms.  Your medical history.  An ear exam. During the exam, your health care provider will look into your ear with an instrument called an otoscope. You may have tests, including a hearing test. How is this treated? This condition may be treated by:  Using ear drops to soften the earwax.  Having the earwax removed by a health care provider. The health care provider may: ? Flush the ear with water. ? Use an instrument that has a loop on the end (curette). ? Use a suction device.  Surgery to remove the wax buildup. This may be done in severe cases. Follow these instructions at home:   Take over-the-counter and prescription medicines only as told by your health care provider.  Do not put any objects, including cotton swabs, into your ear. You can clean the opening of your ear canal with a washcloth or facial tissue.  Follow instructions from your health care provider about cleaning your ears. Do not over-clean your ears.  Drink enough fluid to keep your urine clear or pale yellow. This will  help to thin the earwax.  Keep all follow-up visits as told by your health care provider. If earwax builds up in your ears often or if you use hearing aids, consider seeing your health care provider for routine, preventive ear cleanings. Ask your health care provider how often you should schedule your cleanings.  If you have hearing aids, clean them according to instructions from the manufacturer and your health care provider. Contact a health care provider if:  You have ear pain.  You develop a fever.  You have blood, pus, or other fluid coming from your ear.  You have hearing loss.  You have ringing in your ears that does not go away.  Your symptoms do not  improve with treatment.  You feel like the room is spinning (vertigo). Summary  Earwax can build up in the ear and cause discomfort or hearing loss.  The most common symptoms of this condition include reduced or muffled hearing and a feeling of fullness in the ear or feeling that the ear is plugged.  This condition may be diagnosed based on your symptoms, your medical history, and an ear exam.  This condition may be treated by using ear drops to soften the earwax or by having the earwax removed by a health care provider.  Do not put any objects, including cotton swabs, into your ear. You can clean the opening of your ear canal with a washcloth or facial tissue. This information is not intended to replace advice given to you by your health care provider. Make sure you discuss any questions you have with your health care provider. Document Released: 10/24/2004 Document Revised: 08/28/2017 Document Reviewed: 11/27/2016 Elsevier Interactive Patient Education  2019 [place] [employer] What is ear irrigation? Ear irrigation is a procedure to wash dirt and wax out of your ear canal. This procedure is also called lavage. You may need ear irrigation if you are having trouble hearing because of a buildup of earwax. You may  also have ear irrigation as part of the treatment for an ear infection. Getting wax and dirt out of your ear canal can help some medicines given as ear drops work better. How is ear irrigation performed? The procedure may vary among health care providers and hospitals. You may be given ear drops to put in your ear 15-20 minutes before irrigation. This helps loosen the wax. Then, a syringe containing water and a sterile salt solution (saline) can be gently inserted into the ear canal. The saline is used to flush out wax and other debris. Ear irrigation kits are also available to use at home. Ask your health care provider if this is an option for you. Use a home irrigation kit only as told by your health care provider. Read the package instructions carefully. Follow the directions for using the syringe. Use water that is room temperature. Do not do ear irrigation at home if you:  Have diabetes. Diabetes increases the risk of infection.  Have a hole or tear in your eardrum.  Have tubes in your ears. What are the risks of ear irrigation? Generally, this is a safe procedure. However, problems may occur, including:  Infection.  Pain.  Hearing loss.  Pushing water and debris into the eardrum. This can occur if there are holes in the eardrum.  Ear irrigation failing to work. How should I care for my ears after having them irrigated? Cleaning   Clean the outside of your ear with a soft washcloth daily.  If told by your health care provider, use a few drops of baby oil, mineral oil, glycerin, hydrogen peroxide, or over-the-counter earwax softening drops.  Do not use cotton swabs to clean your ears. These can push wax down into the ear canal.  Do not put anything into your ears to try to remove wax. This includes ear candles. General Instructions  Take over-the-counter and prescription medicines only as told by your health care provider.  If you were prescribed an antibiotic medicine, use  it as told by your health care provider. Do not stop using the antibiotic even if your condition improves.  Keep all follow-up visits as told by your health care provider. This is important.  Visit your  health care provider at least once a year to have your ears and hearing checked. When should I seek medical care? Seek medical care if:  Your hearing is not improving or is getting worse.  You have pain or redness in your ear.  You have fluid, blood, or pus coming out of your ear. This information is not intended to replace advice given to you by your health care provider. Make sure you discuss any questions you have with your health care provider. Document Released: 10/13/2015 Document Revised: 04/30/2017 Document Reviewed: 02/23/2015 Elsevier Interactive Patient Education  2019 [place], [place] [facility]

## 2018-12-11 ENCOUNTER — Other Ambulatory Visit: Payer: Self-pay | Admitting: Internal Medicine

## 2018-12-14 NOTE — Telephone Encounter (Signed)
rx was sent to dr Burnice Logan who has retired. Pt need sees dr Jerilee Hoh. Pt last seen dr Jerilee Hoh in Oct 16 2018

## 2018-12-31 ENCOUNTER — Telehealth: Payer: Self-pay

## 2018-12-31 NOTE — Telephone Encounter (Signed)
Author phoned pt. to assess interest in scheduling virtual awv. No answer, author left detailed VM asking for return call.   

## 2019-01-07 NOTE — Telephone Encounter (Signed)
° ° °  Pt returned your call and said he would be interested in scheduling a AWV   Edward Berger

## 2019-02-10 ENCOUNTER — Encounter: Payer: Self-pay | Admitting: Internal Medicine

## 2019-02-10 ENCOUNTER — Other Ambulatory Visit: Payer: Self-pay

## 2019-02-10 ENCOUNTER — Ambulatory Visit (INDEPENDENT_AMBULATORY_CARE_PROVIDER_SITE_OTHER): Payer: PPO | Admitting: Internal Medicine

## 2019-02-10 VITALS — BP 102/68 | HR 61 | Temp 97.8°F | Wt 162.1 lb

## 2019-02-10 DIAGNOSIS — H66001 Acute suppurative otitis media without spontaneous rupture of ear drum, right ear: Secondary | ICD-10-CM | POA: Diagnosis not present

## 2019-02-10 DIAGNOSIS — H6123 Impacted cerumen, bilateral: Secondary | ICD-10-CM

## 2019-02-10 MED ORDER — AMOXICILLIN-POT CLAVULANATE 875-125 MG PO TABS: 1.0000 | ORAL_TABLET | Freq: Two times a day (BID) | ORAL | 0 refills | Status: AC

## 2019-02-10 NOTE — Progress Notes (Signed)
Acute Office Visit     CC/Reason for Visit: earwax build up, pain in right ear  HPI: Edward Berger is a 73 y.o. male who is coming in today for the above mentioned reasons.  He has been noticing increased difficulty hearing as well as some mild pain of the right ear when opening his jaw, he knows this happens when he has excessive earwax buildup.  He last had his ears irrigated in January.  He used some over-the-counter Debrox without relief.  Would like to have them irrigated today.   Past Medical/Surgical History: Past Medical History:  Diagnosis Date  . Blood transfusion without reported diagnosis    years ago with surgery   . Cervical radiculopathy, chronic     Past Surgical History:  Procedure Laterality Date  . ANKLE SURGERY     right  . COLONOSCOPY    . ENT     nasal polyp removal   . SHOULDER SURGERY     left    Social History:  reports that he has never smoked. He has never used smokeless tobacco. He reports current alcohol use. He reports that he does not use drugs.  Allergies: No Known Allergies  Family History:  Family History  Problem Relation Age of Onset  . Heart disease Mother   . Cancer Father        prostate  . Colon cancer Neg Hx   . Colon polyps Neg Hx   . Esophageal cancer Neg Hx   . Rectal cancer Neg Hx   . Stomach cancer Neg Hx      Current Outpatient Medications:  .  finasteride (PROPECIA) 1 MG tablet, TAKE ONE TABLET BY MOUTH ONE TIME DAILY , Disp: 90 tablet, Rfl: 0 .  ibuprofen (ADVIL,MOTRIN) 200 MG tablet, Take 400 mg by mouth every 6 (six) hours as needed for pain., Disp: , Rfl:  .  Multiple Vitamin (MULTIVITAMIN) tablet, Take 1 tablet by mouth daily., Disp: , Rfl:  .  nitroGLYCERIN (NITRODUR - DOSED IN MG/24 HR) 0.2 mg/hr patch, Place 1/4 to 1/2 of a patch over affected region. Remove and replace once daily.  Slightly alter skin placement daily, Disp: 30 patch, Rfl: 1 .  Omega-3 Fatty Acids (FISH OIL) 1000 MG CAPS, Take 1  capsule by mouth daily., Disp: , Rfl:  .  TURMERIC PO, Take by mouth., Disp: , Rfl:  .  amoxicillin-clavulanate (AUGMENTIN) 875-125 MG tablet, Take 1 tablet by mouth 2 (two) times daily for 7 days., Disp: 14 tablet, Rfl: 0  Review of Systems:  Constitutional: Denies fever, chills, diaphoresis, appetite change and fatigue.  HEENT: Denies photophobia, eye pain, redness, congestion, sore throat, rhinorrhea, sneezing, mouth sores, trouble swallowing, neck pain, neck stiffness and tinnitus.   Respiratory: Denies SOB, DOE, cough, chest tightness,  and wheezing.   Cardiovascular: Denies chest pain, palpitations and leg swelling.  Gastrointestinal: Denies nausea, vomiting, abdominal pain, diarrhea, constipation, blood in stool and abdominal distention.  Genitourinary: Denies dysuria, urgency, frequency, hematuria, flank pain and difficulty urinating.  Endocrine: Denies: hot or cold intolerance, sweats, changes in hair or nails, polyuria, polydipsia. Musculoskeletal: Denies myalgias, back pain, joint swelling, arthralgias and gait problem.  Skin: Denies pallor, rash and wound.  Neurological: Denies dizziness, seizures, syncope, weakness, light-headedness, numbness and headaches.  Hematological: Denies adenopathy. Easy bruising, personal or family bleeding history  Psychiatric/Behavioral: Denies suicidal ideation, mood changes, confusion, nervousness, sleep disturbance and agitation    Physical Exam: Vitals:   02/10/19 1425  BP: 102/68  Pulse: 61  Temp: 97.8 F (36.6 C)  TempSrc: Oral  SpO2: 97%  Weight: 162 lb 1.6 oz (73.5 kg)    Body mass index is 23.94 kg/m.   Constitutional: NAD, calm, comfortable Eyes: PERRL, lids and conjunctivae normal ENMT: Mucous membranes are moist. Posterior pharynx clear of any exudate or lesions. Normal dentition. Tympanic membrane obstructed by cerumen bilaterally.  Psychiatric: Normal judgment and insight. Alert and oriented x 3. Normal mood.     Impression and Plan:  Bilateral impacted cerumen Non-recurrent acute suppurative otitis media of right ear without spontaneous rupture of tympanic membrane   Cerumen Desimpaction  Warm water was applied and gentle ear lavage performed on bilateral ears. There were no complications and following the desimpaction the tympanic membranes were visible. Left tympanic membrane is intact following the procedure. Right tympanic membrane is erythematous and bulging with an air-fluid level. The patient reported relief of symptoms after removal of cerumen.  -Will give Rx for Augmentin BID for 7 days for his right otitis media.    Patient Instructions  -Nice seeing you today!  -Take augmentin 1 tablet twice daily for 7 days on a full stomach.  -Schedule your annual wellness visit for the fall with me.   Earwax Buildup, Adult The ears produce a substance called earwax that helps keep bacteria out of the ear and protects the skin in the ear canal. Occasionally, earwax can build up in the ear and cause discomfort or hearing loss. What increases the risk? This condition is more likely to develop in people who:  Are male.  Are elderly.  Naturally produce more earwax.  Clean their ears often with cotton swabs.  Use earplugs often.  Use in-ear headphones often.  Wear hearing aids.  Have narrow ear canals.  Have earwax that is overly thick or sticky.  Have eczema.  Are dehydrated.  Have excess hair in the ear canal. What are the signs or symptoms? Symptoms of this condition include:  Reduced or muffled hearing.  A feeling of fullness in the ear or feeling that the ear is plugged.  Fluid coming from the ear.  Ear pain.  Ear itch.  Ringing in the ear.  Coughing.  An obvious piece of earwax that can be seen inside the ear canal. How is this diagnosed? This condition may be diagnosed based on:  Your symptoms.  Your medical history.  An ear exam. During the exam, your  health care provider will look into your ear with an instrument called an otoscope. You may have tests, including a hearing test. How is this treated? This condition may be treated by:  Using ear drops to soften the earwax.  Having the earwax removed by a health care provider. The health care provider may: ? Flush the ear with water. ? Use an instrument that has a loop on the end (curette). ? Use a suction device.  Surgery to remove the wax buildup. This may be done in severe cases. Follow these instructions at home:   Take over-the-counter and prescription medicines only as told by your health care provider.  Do not put any objects, including cotton swabs, into your ear. You can clean the opening of your ear canal with a washcloth or facial tissue.  Follow instructions from your health care provider about cleaning your ears. Do not over-clean your ears.  Drink enough fluid to keep your urine clear or pale yellow. This will help to thin the earwax.  Keep all follow-up  visits as told by your health care provider. If earwax builds up in your ears often or if you use hearing aids, consider seeing your health care provider for routine, preventive ear cleanings. Ask your health care provider how often you should schedule your cleanings.  If you have hearing aids, clean them according to instructions from the manufacturer and your health care provider. Contact a health care provider if:  You have ear pain.  You develop a fever.  You have blood, pus, or other fluid coming from your ear.  You have hearing loss.  You have ringing in your ears that does not go away.  Your symptoms do not improve with treatment.  You feel like the room is spinning (vertigo). Summary  Earwax can build up in the ear and cause discomfort or hearing loss.  The most common symptoms of this condition include reduced or muffled hearing and a feeling of fullness in the ear or feeling that the ear is  plugged.  This condition may be diagnosed based on your symptoms, your medical history, and an ear exam.  This condition may be treated by using ear drops to soften the earwax or by having the earwax removed by a health care provider.  Do not put any objects, including cotton swabs, into your ear. You can clean the opening of your ear canal with a washcloth or facial tissue. This information is not intended to replace advice given to you by your health care provider. Make sure you discuss any questions you have with your health care provider. Document Released: 10/24/2004 Document Revised: 08/28/2017 Document Reviewed: 11/27/2016 Elsevier Interactive Patient Education  2019 [employer].   Otitis Media, Adult  Otitis media means that the middle ear is red and swollen (inflamed) and full of fluid. The condition usually goes away on its own. Follow these instructions at home:  Take over-the-counter and prescription medicines only as told by your doctor.  If you were prescribed an antibiotic medicine, take it as told by your doctor. Do not stop taking the antibiotic even if you start to feel better.  Keep all follow-up visits as told by your doctor. This is important. Contact a doctor if:  You have bleeding from your nose.  There is a lump on your neck.  You are not getting better in 5 days.  You feel worse instead of better. Get help right away if:  You have pain that is not helped with medicine.  You have swelling, redness, or pain around your ear.  You get a stiff neck.  You cannot move part of your face (paralyzed).  You notice that the bone behind your ear hurts when you touch it.  You get a very bad headache. Summary  Otitis media means that the middle ear is red, swollen, and full of fluid.  This condition usually goes away on its own. In some cases, treatment may be needed.  If you were prescribed an antibiotic medicine, take it as told by your doctor. This  information is not intended to replace advice given to you by your health care provider. Make sure you discuss any questions you have with your health care provider. Document Released: 03/04/2008 Document Revised: 10/07/2016 Document Reviewed: 10/07/2016 Elsevier Interactive Patient Education  2019 [place], [place] [facility]

## 2019-02-10 NOTE — Patient Instructions (Addendum)
-Nice seeing you today!  -Take augmentin 1 tablet twice daily for 7 days on a full stomach.  -Schedule your annual wellness visit for the fall with me.   Earwax Buildup, Adult The ears produce a substance called earwax that helps keep bacteria out of the ear and protects the skin in the ear canal. Occasionally, earwax can build up in the ear and cause discomfort or hearing loss. What increases the risk? This condition is more likely to develop in people who:  Are male.  Are elderly.  Naturally produce more earwax.  Clean their ears often with cotton swabs.  Use earplugs often.  Use in-ear headphones often.  Wear hearing aids.  Have narrow ear canals.  Have earwax that is overly thick or sticky.  Have eczema.  Are dehydrated.  Have excess hair in the ear canal. What are the signs or symptoms? Symptoms of this condition include:  Reduced or muffled hearing.  A feeling of fullness in the ear or feeling that the ear is plugged.  Fluid coming from the ear.  Ear pain.  Ear itch.  Ringing in the ear.  Coughing.  An obvious piece of earwax that can be seen inside the ear canal. How is this diagnosed? This condition may be diagnosed based on:  Your symptoms.  Your medical history.  An ear exam. During the exam, your health care provider will look into your ear with an instrument called an otoscope. You may have tests, including a hearing test. How is this treated? This condition may be treated by:  Using ear drops to soften the earwax.  Having the earwax removed by a health care provider. The health care provider may: ? Flush the ear with water. ? Use an instrument that has a loop on the end (curette). ? Use a suction device.  Surgery to remove the wax buildup. This may be done in severe cases. Follow these instructions at home:   Take over-the-counter and prescription medicines only as told by your health care provider.  Do not put any objects,  including cotton swabs, into your ear. You can clean the opening of your ear canal with a washcloth or facial tissue.  Follow instructions from your health care provider about cleaning your ears. Do not over-clean your ears.  Drink enough fluid to keep your urine clear or pale yellow. This will help to thin the earwax.  Keep all follow-up visits as told by your health care provider. If earwax builds up in your ears often or if you use hearing aids, consider seeing your health care provider for routine, preventive ear cleanings. Ask your health care provider how often you should schedule your cleanings.  If you have hearing aids, clean them according to instructions from the manufacturer and your health care provider. Contact a health care provider if:  You have ear pain.  You develop a fever.  You have blood, pus, or other fluid coming from your ear.  You have hearing loss.  You have ringing in your ears that does not go away.  Your symptoms do not improve with treatment.  You feel like the room is spinning (vertigo). Summary  Earwax can build up in the ear and cause discomfort or hearing loss.  The most common symptoms of this condition include reduced or muffled hearing and a feeling of fullness in the ear or feeling that the ear is plugged.  This condition may be diagnosed based on your symptoms, your medical history, and an ear  exam.  This condition may be treated by using ear drops to soften the earwax or by having the earwax removed by a health care provider.  Do not put any objects, including cotton swabs, into your ear. You can clean the opening of your ear canal with a washcloth or facial tissue. This information is not intended to replace advice given to you by your health care provider. Make sure you discuss any questions you have with your health care provider. Document Released: 10/24/2004 Document Revised: 08/28/2017 Document Reviewed: 11/27/2016 Elsevier  Interactive Patient Education  2019 [employer].   Otitis Media, Adult  Otitis media means that the middle ear is red and swollen (inflamed) and full of fluid. The condition usually goes away on its own. Follow these instructions at home:  Take over-the-counter and prescription medicines only as told by your doctor.  If you were prescribed an antibiotic medicine, take it as told by your doctor. Do not stop taking the antibiotic even if you start to feel better.  Keep all follow-up visits as told by your doctor. This is important. Contact a doctor if:  You have bleeding from your nose.  There is a lump on your neck.  You are not getting better in 5 days.  You feel worse instead of better. Get help right away if:  You have pain that is not helped with medicine.  You have swelling, redness, or pain around your ear.  You get a stiff neck.  You cannot move part of your face (paralyzed).  You notice that the bone behind your ear hurts when you touch it.  You get a very bad headache. Summary  Otitis media means that the middle ear is red, swollen, and full of fluid.  This condition usually goes away on its own. In some cases, treatment may be needed.  If you were prescribed an antibiotic medicine, take it as told by your doctor. This information is not intended to replace advice given to you by your health care provider. Make sure you discuss any questions you have with your health care provider. Document Released: 03/04/2008 Document Revised: 10/07/2016 Document Reviewed: 10/07/2016 Elsevier Interactive Patient Education  2019 [employer].

## 2019-02-18 ENCOUNTER — Telehealth: Payer: Self-pay | Admitting: Internal Medicine

## 2019-02-18 NOTE — Telephone Encounter (Signed)
Patient is aware 

## 2019-02-18 NOTE — Telephone Encounter (Signed)
Let him know that 8 days of antibiotics is sufficient to treat an ear infection. The symptoms will sometimes lag the completion of abx. I would recommend he continue a decongestant like mucinex D BID for a week or so.

## 2019-02-18 NOTE — Telephone Encounter (Signed)
Copied from [place] [id number]. Topic: Quick Communication - See Telephone Encounter >> Feb 18, 2019  9:34 AM Ivar Drape wrote: CRM for notification. See Telephone encounter for: 02/18/19. Patient saw the provider on 02/10/2019 and he was prescribed amoxicillin-clavulanate (AUGMENTIN) 875-125 MG tablet for his ears.  He has taken all of the antibiotic.  His condition is 50% better but not entirely gone.  He would like another round of the antibiotic.  Please advise.

## 2019-03-01 ENCOUNTER — Telehealth: Payer: Self-pay | Admitting: *Deleted

## 2019-03-01 NOTE — Telephone Encounter (Signed)
Copied from [place] [id number]. Topic: General - Other >> Mar 01, 2019 11:08 AM Carolyn Stare wrote:  Pt call to say his ear still feel stopped up and is having a little bit of pain

## 2019-03-02 NOTE — Telephone Encounter (Signed)
Patient states he has been using deborox with no relief.  Office visit?

## 2019-03-02 NOTE — Telephone Encounter (Signed)
Please contact to schedule virtual OV.

## 2019-03-02 NOTE — Telephone Encounter (Signed)
Patient call to schedule the in office visit. Patient call after hours. [phone]

## 2019-03-02 NOTE — Telephone Encounter (Signed)
Yes

## 2019-03-02 NOTE — Telephone Encounter (Signed)
Debrox ear drops: 2 drops BID in each ear. If still having issues in 3 days have him schedule an in-office visit to assess.

## 2019-03-02 NOTE — Telephone Encounter (Signed)
Left message on machine for patient to return our call to schedule an in-office visit.

## 2019-03-04 ENCOUNTER — Ambulatory Visit (INDEPENDENT_AMBULATORY_CARE_PROVIDER_SITE_OTHER): Payer: PPO | Admitting: Internal Medicine

## 2019-03-04 ENCOUNTER — Encounter: Payer: Self-pay | Admitting: Internal Medicine

## 2019-03-04 ENCOUNTER — Other Ambulatory Visit: Payer: Self-pay

## 2019-03-04 VITALS — BP 110/62 | HR 52 | Temp 98.0°F | Wt 156.1 lb

## 2019-03-04 DIAGNOSIS — H66001 Acute suppurative otitis media without spontaneous rupture of ear drum, right ear: Secondary | ICD-10-CM | POA: Diagnosis not present

## 2019-03-04 MED ORDER — AMOXICILLIN-POT CLAVULANATE 875-125 MG PO TABS: 1.0000 | ORAL_TABLET | Freq: Two times a day (BID) | ORAL | 0 refills | Status: AC

## 2019-03-04 NOTE — Patient Instructions (Addendum)
-  Nice seeing you today!!  -Another 5 days of Augmentin twice daily.  -Continue Mucinex D for 14 days.  -Schedule visit every 10-12 weeks for ear cleanings.

## 2019-03-04 NOTE — Progress Notes (Signed)
Acute Office Visit     CC/Reason for Visit: Still with right ear pain  HPI: Edward Berger is a 73 y.o. male who is coming in today for the above mentioned reasons. Seen last week for bilateral impacted cerumen and right otitis media. Is definitely better but still with residual pain. No fever. Pain behind right ear when opening jaw.   Past Medical/Surgical History: Past Medical History:  Diagnosis Date  . Blood transfusion without reported diagnosis    years ago with surgery   . Cervical radiculopathy, chronic     Past Surgical History:  Procedure Laterality Date  . ANKLE SURGERY     right  . COLONOSCOPY    . ENT     nasal polyp removal   . SHOULDER SURGERY     left    Social History:  reports that he has never smoked. He has never used smokeless tobacco. He reports current alcohol use. He reports that he does not use drugs.  Allergies: No Known Allergies  Family History:  Family History  Problem Relation Age of Onset  . Heart disease Mother   . Cancer Father        prostate  . Colon cancer Neg Hx   . Colon polyps Neg Hx   . Esophageal cancer Neg Hx   . Rectal cancer Neg Hx   . Stomach cancer Neg Hx      Current Outpatient Medications:  .  finasteride (PROPECIA) 1 MG tablet, TAKE ONE TABLET BY MOUTH ONE TIME DAILY , Disp: 90 tablet, Rfl: 0 .  ibuprofen (ADVIL,MOTRIN) 200 MG tablet, Take 400 mg by mouth every 6 (six) hours as needed for pain., Disp: , Rfl:  .  Multiple Vitamin (MULTIVITAMIN) tablet, Take 1 tablet by mouth daily., Disp: , Rfl:  .  nitroGLYCERIN (NITRODUR - DOSED IN MG/24 HR) 0.2 mg/hr patch, Place 1/4 to 1/2 of a patch over affected region. Remove and replace once daily.  Slightly alter skin placement daily, Disp: 30 patch, Rfl: 1 .  Omega-3 Fatty Acids (FISH OIL) 1000 MG CAPS, Take 1 capsule by mouth daily., Disp: , Rfl:  .  TURMERIC PO, Take by mouth., Disp: , Rfl:  .  amoxicillin-clavulanate (AUGMENTIN) 875-125 MG tablet, Take 1  tablet by mouth 2 (two) times daily for 5 days., Disp: 10 tablet, Rfl: 0  Review of Systems:  Constitutional: Denies fever, chills, diaphoresis, appetite change and fatigue.  HEENT: Denies photophobia, eye pain, redness, hearing loss, sore throat, rhinorrhea, sneezing, mouth sores, trouble swallowing, neck pain, neck stiffness and tinnitus.   Respiratory: Denies SOB, DOE, cough, chest tightness,  and wheezing.   Cardiovascular: Denies chest pain, palpitations and leg swelling.  Gastrointestinal: Denies nausea, vomiting, abdominal pain, diarrhea, constipation, blood in stool and abdominal distention.  Genitourinary: Denies dysuria, urgency, frequency, hematuria, flank pain and difficulty urinating.  Endocrine: Denies: hot or cold intolerance, sweats, changes in hair or nails, polyuria, polydipsia. Musculoskeletal: Denies myalgias, back pain, joint swelling, arthralgias and gait problem.  Skin: Denies pallor, rash and wound.  Neurological: Denies dizziness, seizures, syncope, weakness, light-headedness, numbness and headaches.  Hematological: Denies adenopathy. Easy bruising, personal or family bleeding history  Psychiatric/Behavioral: Denies suicidal ideation, mood changes, confusion, nervousness, sleep disturbance and agitation    Physical Exam: Vitals:   03/04/19 1129  BP: 110/62  Pulse: (!) 52  Temp: 98 F (36.7 C)  TempSrc: Oral  SpO2: 97%  Weight: 156 lb 1.6 oz (70.8 kg)    Body  mass index is 23.05 kg/m.   Constitutional: NAD, calm, comfortable Eyes: PERRL, lids and conjunctivae normal ENMT: Mucous membranes are moist. Posterior pharynx clear of any exudate or lesions. Normal dentition. Tympanic membrane is pearly white, no erythema or bulging on left, erythematous on right Psychiatric: Normal judgment and insight. Alert and oriented x 3. Normal mood.    Impression and Plan:  Non-recurrent acute suppurative otitis media of right ear without spontaneous rupture of  tympanic membrane  -Will give another 5 days of augmentin. -Advised mucinex D for 2 weeks. -RTC if no improvement. -Will schedule follow ups every 10-12 weeks for routine ear cleaning as he accumulates cerumen fairly quickly despite use of debrox.    Patient Instructions  -Nice seeing you today!!  -Another 5 days of Augmentin twice daily.  -Continue Mucinex D for 14 days.  -Schedule visit every 10-12 weeks for ear cleanings.     Lelon Frohlich, MD [facility] at [facility]

## 2019-03-26 ENCOUNTER — Other Ambulatory Visit: Payer: Self-pay | Admitting: Internal Medicine

## 2019-04-01 DIAGNOSIS — D0439 Carcinoma in situ of skin of other parts of face: Secondary | ICD-10-CM | POA: Diagnosis not present

## 2019-04-01 DIAGNOSIS — D485 Neoplasm of uncertain behavior of skin: Secondary | ICD-10-CM | POA: Diagnosis not present

## 2019-04-01 DIAGNOSIS — L570 Actinic keratosis: Secondary | ICD-10-CM | POA: Diagnosis not present

## 2019-04-08 DIAGNOSIS — H0011 Chalazion right upper eyelid: Secondary | ICD-10-CM | POA: Diagnosis not present

## 2019-04-09 ENCOUNTER — Other Ambulatory Visit: Payer: Self-pay | Admitting: Internal Medicine

## 2019-04-09 DIAGNOSIS — H6123 Impacted cerumen, bilateral: Secondary | ICD-10-CM

## 2019-04-09 DIAGNOSIS — H903 Sensorineural hearing loss, bilateral: Secondary | ICD-10-CM

## 2019-04-14 DIAGNOSIS — Z8709 Personal history of other diseases of the respiratory system: Secondary | ICD-10-CM | POA: Diagnosis not present

## 2019-04-14 DIAGNOSIS — H903 Sensorineural hearing loss, bilateral: Secondary | ICD-10-CM | POA: Diagnosis not present

## 2019-04-14 DIAGNOSIS — H938X3 Other specified disorders of ear, bilateral: Secondary | ICD-10-CM | POA: Diagnosis not present

## 2019-04-14 DIAGNOSIS — H6981 Other specified disorders of Eustachian tube, right ear: Secondary | ICD-10-CM | POA: Diagnosis not present

## 2019-04-14 DIAGNOSIS — Z974 Presence of external hearing-aid: Secondary | ICD-10-CM | POA: Diagnosis not present

## 2019-04-14 DIAGNOSIS — H9201 Otalgia, right ear: Secondary | ICD-10-CM | POA: Diagnosis not present

## 2019-04-14 DIAGNOSIS — M26621 Arthralgia of right temporomandibular joint: Secondary | ICD-10-CM | POA: Diagnosis not present

## 2019-04-27 DIAGNOSIS — H903 Sensorineural hearing loss, bilateral: Secondary | ICD-10-CM | POA: Diagnosis not present

## 2019-06-18 ENCOUNTER — Other Ambulatory Visit: Payer: Self-pay

## 2019-06-18 ENCOUNTER — Encounter: Payer: Self-pay | Admitting: Internal Medicine

## 2019-06-18 ENCOUNTER — Ambulatory Visit (INDEPENDENT_AMBULATORY_CARE_PROVIDER_SITE_OTHER): Payer: PPO | Admitting: Internal Medicine

## 2019-06-18 VITALS — BP 102/62 | HR 57 | Temp 97.2°F | Ht 68.0 in | Wt 153.8 lb

## 2019-06-18 DIAGNOSIS — Z23 Encounter for immunization: Secondary | ICD-10-CM | POA: Diagnosis not present

## 2019-06-18 DIAGNOSIS — H6123 Impacted cerumen, bilateral: Secondary | ICD-10-CM

## 2019-06-18 DIAGNOSIS — M5442 Lumbago with sciatica, left side: Secondary | ICD-10-CM | POA: Diagnosis not present

## 2019-06-18 DIAGNOSIS — G8929 Other chronic pain: Secondary | ICD-10-CM | POA: Diagnosis not present

## 2019-06-18 DIAGNOSIS — Z0000 Encounter for general adult medical examination without abnormal findings: Secondary | ICD-10-CM

## 2019-06-18 LAB — COMPREHENSIVE METABOLIC PANEL
ALT: 14 U/L (ref 0–53)
AST: 17 U/L (ref 0–37)
Albumin: 4.1 g/dL (ref 3.5–5.2)
Alkaline Phosphatase: 67 U/L (ref 39–117)
BUN: 16 mg/dL (ref 6–23)
CO2: 31 mEq/L (ref 19–32)
Calcium: 9.3 mg/dL (ref 8.4–10.5)
Chloride: 103 mEq/L (ref 96–112)
Creatinine, Ser: 1.07 mg/dL (ref 0.40–1.50)
GFR: 67.64 mL/min (ref >60.00)
Glucose, Bld: 102 mg/dL — ABNORMAL HIGH (ref 70–99)
Potassium: 4.1 mEq/L (ref 3.5–5.1)
Sodium: 141 mEq/L (ref 135–145)
Total Bilirubin: 0.8 mg/dL (ref 0.2–1.2)
Total Protein: 6.6 g/dL (ref 6.0–8.3)

## 2019-06-18 LAB — CBC WITH DIFFERENTIAL/PLATELET
Basophils Absolute: 0 10*3/uL (ref 0.0–0.1)
Basophils Relative: 0.4 % (ref 0.0–3.0)
Eosinophils Absolute: 0.2 10*3/uL (ref 0.0–0.7)
Eosinophils Relative: 3.4 % (ref 0.0–5.0)
HCT: 41.8 % (ref 39.0–52.0)
Hemoglobin: 14.1 g/dL (ref 13.0–17.0)
Lymphocytes Relative: 20.5 % (ref 12.0–46.0)
Lymphs Abs: 1.2 10*3/uL (ref 0.7–4.0)
MCHC: 33.7 g/dL (ref 30.0–36.0)
MCV: 94 fl (ref 78.0–100.0)
Monocytes Absolute: 0.7 10*3/uL (ref 0.1–1.0)
Monocytes Relative: 12.5 % — ABNORMAL HIGH (ref 3.0–12.0)
Neutro Abs: 3.7 10*3/uL (ref 1.4–7.7)
Neutrophils Relative %: 63.2 % (ref 43.0–77.0)
Platelets: 190 10*3/uL (ref 150.0–400.0)
RBC: 4.45 Mil/uL (ref 4.22–5.81)
RDW: 13 % (ref 11.5–15.5)
WBC: 5.9 10*3/uL (ref 4.0–10.5)

## 2019-06-18 LAB — LIPID PANEL
Cholesterol: 187 mg/dL (ref 0–200)
HDL: 63.8 mg/dL (ref >39.00)
LDL Cholesterol: 107 mg/dL — ABNORMAL HIGH (ref 0–99)
NonHDL: 123.38
Total CHOL/HDL Ratio: 3
Triglycerides: 81 mg/dL (ref 0.0–149.0)
VLDL: 16.2 mg/dL (ref 0.0–40.0)

## 2019-06-18 LAB — VITAMIN B12: Vitamin B-12: 281 pg/mL (ref 211–911)

## 2019-06-18 LAB — HEMOGLOBIN A1C: Hgb A1c MFr Bld: 5.6 % (ref 4.6–6.5)

## 2019-06-18 LAB — TSH: TSH: 1.31 u[IU]/mL (ref 0.35–4.50)

## 2019-06-18 LAB — VITAMIN D 25 HYDROXY (VIT D DEFICIENCY, FRACTURES): VITD: 35.38 ng/mL (ref 30.00–100.00)

## 2019-06-18 NOTE — Patient Instructions (Signed)
-Nice seeing you today!!  -Lab work today; will notify you once results are available.  -Flu vaccine today.  -Consider shingles immunization series at your pharmacy.  -Schedule follow up in 1 year or as needed.   Preventive Care 73 Years and Older, Male Preventive care refers to lifestyle choices and visits with your health care provider that can promote health and wellness. This includes:  A yearly physical exam. This is also called an annual well check.  Regular dental and eye exams.  Immunizations.  Screening for certain conditions.  Healthy lifestyle choices, such as diet and exercise. What can I expect for my preventive care visit? Physical exam Your health care provider will check:  Height and weight. These may be used to calculate body mass index (BMI), which is a measurement that tells if you are at a healthy weight.  Heart rate and blood pressure.  Your skin for abnormal spots. Counseling Your health care provider may ask you questions about:  Alcohol, tobacco, and drug use.  Emotional well-being.  Home and relationship well-being.  Sexual activity.  Eating habits.  History of falls.  Memory and ability to understand (cognition).  Work and work Statistician. What immunizations do I need?  Influenza (flu) vaccine  This is recommended every year. Tetanus, diphtheria, and pertussis (Tdap) vaccine  You may need a Td booster every 10 years. Varicella (chickenpox) vaccine  You may need this vaccine if you have not already been vaccinated. Zoster (shingles) vaccine  You may need this after age 73. Pneumococcal conjugate (PCV13) vaccine  One dose is recommended after age 73. Pneumococcal polysaccharide (PPSV23) vaccine  One dose is recommended after age 73. Measles, mumps, and rubella (MMR) vaccine  You may need at least one dose of MMR if you were born in 1957 or later. You may also need a second dose. Meningococcal conjugate (MenACWY) vaccine   You may need this if you have certain conditions. Hepatitis A vaccine  You may need this if you have certain conditions or if you travel or work in places where you may be exposed to hepatitis A. Hepatitis B vaccine  You may need this if you have certain conditions or if you travel or work in places where you may be exposed to hepatitis B. Haemophilus influenzae type b (Hib) vaccine  You may need this if you have certain conditions. You may receive vaccines as individual doses or as more than one vaccine together in one shot (combination vaccines). Talk with your health care provider about the risks and benefits of combination vaccines. What tests do I need? Blood tests  Lipid and cholesterol levels. These may be checked every 5 years, or more frequently depending on your overall health.  Hepatitis C test.  Hepatitis B test. Screening  Lung cancer screening. You may have this screening every year starting at age 73 if you have a 30-pack-year history of smoking and currently smoke or have quit within the past 15 years.  Colorectal cancer screening. All adults should have this screening starting at age 73 and continuing until age 10. Your health care provider may recommend screening at age 73 if you are at increased risk. You will have tests every 1-10 years, depending on your results and the type of screening test.  Prostate cancer screening. Recommendations will vary depending on your family history and other risks.  Diabetes screening. This is done by checking your blood sugar (glucose) after you have not eaten for a while (fasting). You may have  this done every 1-3 years.  Abdominal aortic aneurysm (AAA) screening. You may need this if you are a current or former smoker.  Sexually transmitted disease (STD) testing. Follow these instructions at home: Eating and drinking  Eat a diet that includes fresh fruits and vegetables, whole grains, lean protein, and low-fat dairy  products. Limit your intake of foods with high amounts of sugar, saturated fats, and salt.  Take vitamin and mineral supplements as recommended by your health care provider.  Do not drink alcohol if your health care provider tells you not to drink.  If you drink alcohol: ? Limit how much you have to 0-2 drinks a day. ? Be aware of how much alcohol is in your drink. In the [place] one drink equals one 12 oz bottle of beer (355 mL), one 5 oz glass of wine (148 mL), or one 1 oz glass of hard liquor (44 mL). Lifestyle  Take daily care of your teeth and gums.  Stay active. Exercise for at least 30 minutes on 5 or more days each week.  Do not use any products that contain nicotine or tobacco, such as cigarettes, e-cigarettes, and chewing tobacco. If you need help quitting, ask your health care provider.  If you are sexually active, practice safe sex. Use a condom or other form of protection to prevent STIs (sexually transmitted infections).  Talk with your health care provider about taking a low-dose aspirin or statin. What's next?  Visit your health care provider once a year for a well check visit.  Ask your health care provider how often you should have your eyes and teeth checked.  Stay up to date on all vaccines. This information is not intended to replace advice given to you by your health care provider. Make sure you discuss any questions you have with your health care provider. Document Released: 10/13/2015 Document Revised: 09/10/2018 Document Reviewed: 09/10/2018 Elsevier Patient Education  2020 [employer]  

## 2019-06-18 NOTE — Progress Notes (Signed)
Established Patient Office Visit     CC/Reason for Visit: Annual preventive exam and subsequent Medicare wellness visit  HPI: Edward Berger is a 73 y.o. male who is coming in today for the above mentioned reasons.  He has no past medical history of significance other than some cervical and lumbar spinal stenosis that he has been managing conservatively.  He is an avid Education officer, environmental and sometimes his back causes him some issues and he would like some information as to what to do when that happens.  He is requesting flu vaccine today.  Since I last saw him he had a lesion removed from above his right eye, he thinks he was told it was cancer but is not sure what type, currently under the care of Dr. Delman Cheadle, dermatology.   Past Medical/Surgical History: Past Medical History:  Diagnosis Date  . Blood transfusion without reported diagnosis    years ago with surgery   . Cervical radiculopathy, chronic     Past Surgical History:  Procedure Laterality Date  . ANKLE SURGERY     right  . COLONOSCOPY    . ENT     nasal polyp removal   . SHOULDER SURGERY     left    Social History:  reports that he has never smoked. He has never used smokeless tobacco. He reports current alcohol use. He reports that he does not use drugs.  Allergies: No Known Allergies  Family History:  Family History  Problem Relation Age of Onset  . Heart disease Mother   . Cancer Father        prostate  . Colon cancer Neg Hx   . Colon polyps Neg Hx   . Esophageal cancer Neg Hx   . Rectal cancer Neg Hx   . Stomach cancer Neg Hx      Current Outpatient Medications:  .  finasteride (PROPECIA) 1 MG tablet, TAKE ONE TABLET BY MOUTH ONE TIME DAILY , Disp: 90 tablet, Rfl: 0 .  ibuprofen (ADVIL,MOTRIN) 200 MG tablet, Take 400 mg by mouth every 6 (six) hours as needed for pain., Disp: , Rfl:  .  Multiple Vitamin (MULTIVITAMIN) tablet, Take 1 tablet by mouth daily., Disp: , Rfl:  .  nitroGLYCERIN (NITRODUR  - DOSED IN MG/24 HR) 0.2 mg/hr patch, Place 1/4 to 1/2 of a patch over affected region. Remove and replace once daily.  Slightly alter skin placement daily, Disp: 30 patch, Rfl: 1 .  Omega-3 Fatty Acids (FISH OIL) 1000 MG CAPS, Take 1 capsule by mouth daily., Disp: , Rfl:  .  TURMERIC PO, Take by mouth., Disp: , Rfl:   Review of Systems:  Constitutional: Denies fever, chills, diaphoresis, appetite change and fatigue.  HEENT: Denies photophobia, eye pain, redness, hearing loss, ear pain, congestion, sore throat, rhinorrhea, sneezing, mouth sores, trouble swallowing, neck pain, neck stiffness and tinnitus.   Respiratory: Denies SOB, DOE, cough, chest tightness,  and wheezing.   Cardiovascular: Denies chest pain, palpitations and leg swelling.  Gastrointestinal: Denies nausea, vomiting, abdominal pain, diarrhea, constipation, blood in stool and abdominal distention.  Genitourinary: Denies dysuria, urgency, frequency, hematuria, flank pain and difficulty urinating.  Endocrine: Denies: hot or cold intolerance, sweats, changes in hair or nails, polyuria, polydipsia. Musculoskeletal: Denies myalgias, back pain, joint swelling, arthralgias and gait problem.  Skin: Denies pallor, rash and wound.  Neurological: Denies dizziness, seizures, syncope, weakness, light-headedness, numbness and headaches.  Hematological: Denies adenopathy. Easy bruising, personal or family bleeding history  Psychiatric/Behavioral: Denies  suicidal ideation, mood changes, confusion, nervousness, sleep disturbance and agitation    Physical Exam: Vitals:   06/18/19 0802  BP: 102/62  Pulse: (!) 57  Temp: (!) 97.2 F (36.2 C)  TempSrc: Temporal  SpO2: 96%  Weight: 153 lb 12.8 oz (69.8 kg)  Height: '5\' 8"'$  (1.727 m)    Body mass index is 23.39 kg/m.   Constitutional: NAD, calm, comfortable Eyes: PERRL, lids and conjunctivae normal, wears corrective lenses ENMT: Mucous membranes are moist. Posterior pharynx clear of any  exudate or lesions. Normal dentition. Tympanic membrane is obstructed by cerumen bilaterally Neck: normal, supple, no masses, no thyromegaly Respiratory: clear to auscultation bilaterally, no wheezing, no crackles. Normal respiratory effort. No accessory muscle use.  Cardiovascular: Regular rate and rhythm, no murmurs / rubs / gallops. No extremity edema. 2+ pedal pulses. No carotid bruits.  Abdomen: no tenderness, no masses palpated. No hepatosplenomegaly. Bowel sounds positive.  Musculoskeletal: no clubbing / cyanosis. No joint deformity upper and lower extremities. Good ROM, no contractures. Normal muscle tone.  Skin: no rashes, lesions, ulcers. No induration Neurologic: CN 2-12 grossly intact. Sensation intact, DTR normal. Strength 5/5 in all 4.  Psychiatric: Normal judgment and insight. Alert and oriented x 3. Normal mood.    Subsequent Medicare wellness visit   1. Risk factors, based on past  M,S,F -cardiovascular disease risk factors include age, gender   2.  Physical activities: Avid walker/runner on a daily basis   3.  Depression/mood:  Mood is stable, not depressed   4.  Hearing:  He wears bilateral hearing aids   5.  ADL's: Independent in all ADLs   6.  Fall risk:  Low fall risk   7.  Home safety: No problems identified   8.  Height weight, and visual acuity: Height and weight as above, visual acuity is 20/20 in each eye independently and together   9.  Counseling:  Advised shingles vaccination at pharmacy   10. Lab orders based on risk factors: Laboratory update will be reviewed   11. Referral :  None today   12. Care plan:  Follow-up in 1 year or sooner as needed   13. Cognitive assessment:  No cognitive impairment   14. Screening: Patient provided with a written and personalized 5-10 year screening schedule in the AVS.   yes   15. Provider List Update:   PCP, dermatology Dr. Delman Cheadle.  16. Advance Directives: Full code     Office Visit from 06/18/2019 in  [facility]  PHQ-9 Total Score  0      Fall Risk  06/18/2019 01/10/2017 10/06/2014  Falls in the past year? 0 No No  Number falls in past yr: 0 - -  Injury with Fall? 0 - -     Impression and Plan:  Encounter for preventive health examination -He has routine eye and dental care. -Has received flu vaccine today, will go to pharmacy to receive shingles vaccination series, otherwise immunizations are up-to-date. -Healthy lifestyle has been discussed in detail. -Screening labs to be performed today. -He had a colonoscopy in 2018 and is a 5-year callback. -We have discussed potential prostate cancer screening.  His father had prostate cancer but he was diagnosed above the age of 82, he is not African-American, therefore not high risk.  After discussion, we have elected to defer prostate cancer screening at this time.  Bilateral impacted cerumen -Cerumen Desimpaction  Warm water was applied and gentle ear lavage performed on bilateral ears. There were  no complications and following the desimpaction the tympanic membranes were visible. Tympanic membranes are intact following the procedure. Auditory canals are normal. The patient reported relief of symptoms after removal of cerumen.   Chronic bilateral low back pain with left-sided sciatica -We have discussed continued conservative measures, icing, ibuprofen as needed, local massage therapy, back stretches that he has been provided with today. -Do not believe this is severe enough for imaging or further work-up at this time but he is encouraged to call us if he continues to have issues.    Patient Instructions  -Nice seeing you today!!  -Lab work today; will notify you once results are available.  -Flu vaccine today.  -Consider shingles immunization series at your pharmacy.  -Schedule follow up in 1 year or as needed.   Preventive Care 63 Years and Older, Male Preventive care refers to lifestyle choices and  visits with your health care provider that can promote health and wellness. This includes:  A yearly physical exam. This is also called an annual well check.  Regular dental and eye exams.  Immunizations.  Screening for certain conditions.  Healthy lifestyle choices, such as diet and exercise. What can I expect for my preventive care visit? Physical exam Your health care provider will check:  Height and weight. These may be used to calculate body mass index (BMI), which is a measurement that tells if you are at a healthy weight.  Heart rate and blood pressure.  Your skin for abnormal spots. Counseling Your health care provider may ask you questions about:  Alcohol, tobacco, and drug use.  Emotional well-being.  Home and relationship well-being.  Sexual activity.  Eating habits.  History of falls.  Memory and ability to understand (cognition).  Work and work Statistician. What immunizations do I need?  Influenza (flu) vaccine  This is recommended every year. Tetanus, diphtheria, and pertussis (Tdap) vaccine  You may need a Td booster every 10 years. Varicella (chickenpox) vaccine  You may need this vaccine if you have not already been vaccinated. Zoster (shingles) vaccine  You may need this after age 17. Pneumococcal conjugate (PCV13) vaccine  One dose is recommended after age 82. Pneumococcal polysaccharide (PPSV23) vaccine  One dose is recommended after age 72. Measles, mumps, and rubella (MMR) vaccine  You may need at least one dose of MMR if you were born in 1957 or later. You may also need a second dose. Meningococcal conjugate (MenACWY) vaccine  You may need this if you have certain conditions. Hepatitis A vaccine  You may need this if you have certain conditions or if you travel or work in places where you may be exposed to hepatitis A. Hepatitis B vaccine  You may need this if you have certain conditions or if you travel or work in places  where you may be exposed to hepatitis B. Haemophilus influenzae type b (Hib) vaccine  You may need this if you have certain conditions. You may receive vaccines as individual doses or as more than one vaccine together in one shot (combination vaccines). Talk with your health care provider about the risks and benefits of combination vaccines. What tests do I need? Blood tests  Lipid and cholesterol levels. These may be checked every 5 years, or more frequently depending on your overall health.  Hepatitis C test.  Hepatitis B test. Screening  Lung cancer screening. You may have this screening every year starting at age 39 if you have a 30-pack-year history of smoking and currently smoke  or have quit within the past 15 years.  Colorectal cancer screening. All adults should have this screening starting at age 70 and continuing until age 73. Your health care provider may recommend screening at age 88 if you are at increased risk. You will have tests every 1-10 years, depending on your results and the type of screening test.  Prostate cancer screening. Recommendations will vary depending on your family history and other risks.  Diabetes screening. This is done by checking your blood sugar (glucose) after you have not eaten for a while (fasting). You may have this done every 1-3 years.  Abdominal aortic aneurysm (AAA) screening. You may need this if you are a current or former smoker.  Sexually transmitted disease (STD) testing. Follow these instructions at home: Eating and drinking  Eat a diet that includes fresh fruits and vegetables, whole grains, lean protein, and low-fat dairy products. Limit your intake of foods with high amounts of sugar, saturated fats, and salt.  Take vitamin and mineral supplements as recommended by your health care provider.  Do not drink alcohol if your health care provider tells you not to drink.  If you drink alcohol: ? Limit how much you have to 0-2 drinks  a day. ? Be aware of how much alcohol is in your drink. In the [place] one drink equals one 12 oz bottle of beer (355 mL), one 5 oz glass of wine (148 mL), or one 1 oz glass of hard liquor (44 mL). Lifestyle  Take daily care of your teeth and gums.  Stay active. Exercise for at least 30 minutes on 5 or more days each week.  Do not use any products that contain nicotine or tobacco, such as cigarettes, e-cigarettes, and chewing tobacco. If you need help quitting, ask your health care provider.  If you are sexually active, practice safe sex. Use a condom or other form of protection to prevent STIs (sexually transmitted infections).  Talk with your health care provider about taking a low-dose aspirin or statin. What's next?  Visit your health care provider once a year for a well check visit.  Ask your health care provider how often you should have your eyes and teeth checked.  Stay up to date on all vaccines. This information is not intended to replace advice given to you by your health care provider. Make sure you discuss any questions you have with your health care provider. Document Released: 10/13/2015 Document Revised: 09/10/2018 Document Reviewed: 09/10/2018 Elsevier Patient Education  2020 [place], [place] [facility]

## 2019-06-30 DIAGNOSIS — D2271 Melanocytic nevi of right lower limb, including hip: Secondary | ICD-10-CM | POA: Diagnosis not present

## 2019-06-30 DIAGNOSIS — B353 Tinea pedis: Secondary | ICD-10-CM | POA: Diagnosis not present

## 2019-06-30 DIAGNOSIS — D225 Melanocytic nevi of trunk: Secondary | ICD-10-CM | POA: Diagnosis not present

## 2019-06-30 DIAGNOSIS — L821 Other seborrheic keratosis: Secondary | ICD-10-CM | POA: Diagnosis not present

## 2019-06-30 DIAGNOSIS — L570 Actinic keratosis: Secondary | ICD-10-CM | POA: Diagnosis not present

## 2019-06-30 DIAGNOSIS — D2371 Other benign neoplasm of skin of right lower limb, including hip: Secondary | ICD-10-CM | POA: Diagnosis not present

## 2019-06-30 DIAGNOSIS — Z23 Encounter for immunization: Secondary | ICD-10-CM | POA: Diagnosis not present

## 2019-06-30 DIAGNOSIS — Z85828 Personal history of other malignant neoplasm of skin: Secondary | ICD-10-CM | POA: Diagnosis not present

## 2019-06-30 DIAGNOSIS — L309 Dermatitis, unspecified: Secondary | ICD-10-CM | POA: Diagnosis not present

## 2019-07-01 ENCOUNTER — Other Ambulatory Visit: Payer: Self-pay | Admitting: Internal Medicine

## 2019-10-18 ENCOUNTER — Encounter: Payer: Self-pay | Admitting: Internal Medicine

## 2020-01-24 ENCOUNTER — Other Ambulatory Visit: Payer: Self-pay | Admitting: Internal Medicine

## 2020-03-07 ENCOUNTER — Other Ambulatory Visit: Payer: Self-pay

## 2020-03-07 ENCOUNTER — Encounter: Payer: Self-pay | Admitting: Internal Medicine

## 2020-03-07 ENCOUNTER — Ambulatory Visit (INDEPENDENT_AMBULATORY_CARE_PROVIDER_SITE_OTHER): Payer: PPO | Admitting: Internal Medicine

## 2020-03-07 VITALS — BP 128/68 | HR 104 | Temp 97.3°F | Ht 68.0 in | Wt 153.6 lb

## 2020-03-07 DIAGNOSIS — H6123 Impacted cerumen, bilateral: Secondary | ICD-10-CM

## 2020-03-07 NOTE — Progress Notes (Signed)
Established Patient Office Visit     This visit occurred during the SARS-CoV-2 public health emergency.  Safety protocols were in place, including screening questions prior to the visit, additional usage of staff PPE, and extensive cleaning of exam room while observing appropriate contact time as indicated for disinfecting solutions.    CC/Reason for Visit: Ear lavage, ear wax buildup, hearing aides not working well  HPI: Edward Berger is a 74 y.o. male who is coming in today for the above mentioned reasons.He comes in routinely for ear lavage. Has noticed his hearing aides are not working as well and believes he again has significant ear wax buildup. No significant pain, no fever.   Past Medical/Surgical History: Past Medical History:  Diagnosis Date  . Blood transfusion without reported diagnosis    years ago with surgery   . Cervical radiculopathy, chronic     Past Surgical History:  Procedure Laterality Date  . ANKLE SURGERY     right  . COLONOSCOPY    . ENT     nasal polyp removal   . SHOULDER SURGERY     left    Social History:  reports that he has never smoked. He has never used smokeless tobacco. He reports current alcohol use. He reports that he does not use drugs.  Allergies: No Known Allergies  Family History:  Family History  Problem Relation Age of Onset  . Heart disease Mother   . Cancer Father        prostate  . Colon cancer Neg Hx   . Colon polyps Neg Hx   . Esophageal cancer Neg Hx   . Rectal cancer Neg Hx   . Stomach cancer Neg Hx      Current Outpatient Medications:  .  finasteride (PROPECIA) 1 MG tablet, TAKE ONE TABLET BY MOUTH ONE TIME DAILY , Disp: 90 tablet, Rfl: 1 .  ibuprofen (ADVIL,MOTRIN) 200 MG tablet, Take 400 mg by mouth every 6 (six) hours as needed for pain., Disp: , Rfl:  .  Multiple Vitamin (MULTIVITAMIN) tablet, Take 1 tablet by mouth daily., Disp: , Rfl:  .  nitroGLYCERIN (NITRODUR - DOSED IN MG/24 HR) 0.2 mg/hr  patch, Place 1/4 to 1/2 of a patch over affected region. Remove and replace once daily.  Slightly alter skin placement daily, Disp: 30 patch, Rfl: 1 .  Omega-3 Fatty Acids (FISH OIL) 1000 MG CAPS, Take 1 capsule by mouth daily., Disp: , Rfl:  .  TURMERIC PO, Take by mouth., Disp: , Rfl:   Review of Systems:  Constitutional: Denies fever, chills, diaphoresis, appetite change and fatigue.  HEENT: Denies photophobia, eye pain, redness, ear pain, congestion, sore throat, rhinorrhea, sneezing, mouth sores, trouble swallowing, neck pain, neck stiffness and tinnitus.   Respiratory: Denies SOB, DOE, cough, chest tightness,  and wheezing.   Cardiovascular: Denies chest pain, palpitations and leg swelling.  Gastrointestinal: Denies nausea, vomiting, abdominal pain, diarrhea, constipation, blood in stool and abdominal distention.  Genitourinary: Denies dysuria, urgency, frequency, hematuria, flank pain and difficulty urinating.  Endocrine: Denies: hot or cold intolerance, sweats, changes in hair or nails, polyuria, polydipsia. Musculoskeletal: Denies myalgias, back pain, joint swelling, arthralgias and gait problem.  Skin: Denies pallor, rash and wound.  Neurological: Denies dizziness, seizures, syncope, weakness, light-headedness, numbness and headaches.  Hematological: Denies adenopathy. Easy bruising, personal or family bleeding history  Psychiatric/Behavioral: Denies suicidal ideation, mood changes, confusion, nervousness, sleep disturbance and agitation    Physical Exam: Vitals:   03/07/20 1136  BP: 128/68  Pulse: (!) 104  Temp: (!) 97.3 F (36.3 C)  TempSrc: Temporal  Weight: 153 lb 9.6 oz (69.7 kg)  Height: 5\' 8"  (1.727 m)    Body mass index is 23.35 kg/m.   Constitutional: NAD, calm, comfortable Eyes: PERRL, lids and conjunctivae normal ENMT: Mucous membranes are moist.  Tympanic membrane is obstructed by cerumen bilaterally. Neurologic:grossly intact and non-focal  Psychiatric:  Normal judgment and insight. Alert and oriented x 3. Normal mood.    Impression and Plan:  Bilateral impacted cerumen -Cerumen Desimpaction  After patient consent was obtained, warm water was applied and gentle ear lavage performed on bilateral ears. There were no complications and following the desimpaction the tympanic membranes were visible. Tympanic membranes are intact following the procedure. Auditory canals are normal. The patient reported relief of symptoms after removal of cerumen.      Lelon Frohlich, MD [facility]

## 2020-04-17 ENCOUNTER — Other Ambulatory Visit: Payer: Self-pay

## 2020-04-18 ENCOUNTER — Encounter: Payer: Self-pay | Admitting: Family Medicine

## 2020-04-18 ENCOUNTER — Ambulatory Visit (INDEPENDENT_AMBULATORY_CARE_PROVIDER_SITE_OTHER): Payer: PPO | Admitting: Family Medicine

## 2020-04-18 VITALS — BP 124/64 | HR 60 | Temp 97.9°F | Wt 153.6 lb

## 2020-04-18 DIAGNOSIS — R002 Palpitations: Secondary | ICD-10-CM

## 2020-04-18 NOTE — Progress Notes (Signed)
   Subjective:    Patient ID: Edward Berger, male    DOB: 1946-02-12, 74 y.o.   MRN: [id number]  HPI Here for one month of episodes of feeling his heart pounding and racing in his chest at night. This only happens when he is lying in bed at night. It happens every night and often wakes him up. There is no chest pain or SOB. This never happens during the daytime. He is active and he either runs or walks every day, and he never has trouble. He uses a small amount of caffeine every day. No OTC supplements. His heart rate will go up to the 80s and then go back down to the 60s. He feels as if his BP goes up also, but he has never checked it at home.    Review of Systems  Constitutional: Negative.   Respiratory: Negative.   Cardiovascular: Positive for palpitations. Negative for chest pain and leg swelling.  Neurological: Negative.        Objective:   Physical Exam Constitutional:      General: He is not in acute distress.    Appearance: Normal appearance.  Cardiovascular:     Rate and Rhythm: Normal rate and regular rhythm.     Pulses: Normal pulses.     Heart sounds: Normal heart sounds.     Comments: EKG today is normal  Pulmonary:     Effort: Pulmonary effort is normal.     Breath sounds: Normal breath sounds.  Musculoskeletal:     Right lower leg: No edema.     Left lower leg: No edema.  Neurological:     General: No focal deficit present.     Mental Status: He is alert and oriented to person, place, and time.           Assessment & Plan:  Palpitations. We will send him for labs today. Set up a 48 hour Holter monitor soon. He will let us know if anything changes.  Alysia Penna, MD

## 2020-04-19 LAB — CBC WITH DIFFERENTIAL/PLATELET
Absolute Monocytes: 882 cells/uL (ref 200–950)
Basophils Absolute: 42 cells/uL (ref 0–200)
Basophils Relative: 0.6 %
Eosinophils Absolute: 231 cells/uL (ref 15–500)
Eosinophils Relative: 3.3 %
HCT: 42 % (ref 38.5–50.0)
Hemoglobin: 14.1 g/dL (ref 13.2–17.1)
Lymphs Abs: 1449 cells/uL (ref 850–3900)
MCH: 31.5 pg (ref 27.0–33.0)
MCHC: 33.6 g/dL (ref 32.0–36.0)
MCV: 94 fL (ref 80.0–100.0)
MPV: 10.2 fL (ref 7.5–12.5)
Monocytes Relative: 12.6 %
Neutro Abs: 4396 cells/uL (ref 1500–7800)
Neutrophils Relative %: 62.8 %
Platelets: 194 10*3/uL (ref 140–400)
RBC: 4.47 10*6/uL (ref 4.20–5.80)
RDW: 12.2 % (ref 11.0–15.0)
Total Lymphocyte: 20.7 %
WBC: 7 10*3/uL (ref 3.8–10.8)

## 2020-04-19 LAB — BASIC METABOLIC PANEL
BUN/Creatinine Ratio: 20 (calc) (ref 6–22)
BUN: 25 mg/dL (ref 7–25)
CO2: 27 mmol/L (ref 20–32)
Calcium: 9.2 mg/dL (ref 8.6–10.3)
Chloride: 104 mmol/L (ref 98–110)
Creat: 1.26 mg/dL — ABNORMAL HIGH (ref 0.70–1.18)
Glucose, Bld: 85 mg/dL (ref 65–99)
Potassium: 3.9 mmol/L (ref 3.5–5.3)
Sodium: 142 mmol/L (ref 135–146)

## 2020-04-19 LAB — TSH: TSH: 1.41 mIU/L (ref 0.40–4.50)

## 2020-04-19 LAB — T4, FREE: Free T4: 0.9 ng/dL (ref 0.8–1.8)

## 2020-04-19 LAB — T3, FREE: T3, Free: 3.2 pg/mL (ref 2.3–4.2)

## 2020-04-23 ENCOUNTER — Ambulatory Visit (INDEPENDENT_AMBULATORY_CARE_PROVIDER_SITE_OTHER): Payer: PPO

## 2020-04-23 DIAGNOSIS — R002 Palpitations: Secondary | ICD-10-CM

## 2020-05-11 ENCOUNTER — Encounter: Payer: Self-pay | Admitting: Internal Medicine

## 2020-05-12 ENCOUNTER — Telehealth: Payer: Self-pay | Admitting: Interventional Cardiology

## 2020-05-12 DIAGNOSIS — R002 Palpitations: Secondary | ICD-10-CM | POA: Diagnosis not present

## 2020-05-12 NOTE — Telephone Encounter (Signed)
Returned call to patient. His monitor results were imported today for the DOD to read. His results should be sent to his ordering MD in the next few days.

## 2020-05-12 NOTE — Telephone Encounter (Addendum)
Looks like this was ordered at last PCP ov April 18, 2020. This is not a cardiology patient. Will route to monitor department to provide update to patient.

## 2020-05-12 NOTE — Telephone Encounter (Signed)
New message     Patient is calling to get his holter monitor results.  Dr Barbie Banner office told him to call us because they do not have the results.

## 2020-07-18 DIAGNOSIS — D2371 Other benign neoplasm of skin of right lower limb, including hip: Secondary | ICD-10-CM | POA: Diagnosis not present

## 2020-07-18 DIAGNOSIS — L821 Other seborrheic keratosis: Secondary | ICD-10-CM | POA: Diagnosis not present

## 2020-07-18 DIAGNOSIS — D2271 Melanocytic nevi of right lower limb, including hip: Secondary | ICD-10-CM | POA: Diagnosis not present

## 2020-07-18 DIAGNOSIS — D225 Melanocytic nevi of trunk: Secondary | ICD-10-CM | POA: Diagnosis not present

## 2020-07-18 DIAGNOSIS — B353 Tinea pedis: Secondary | ICD-10-CM | POA: Diagnosis not present

## 2020-07-18 DIAGNOSIS — D2272 Melanocytic nevi of left lower limb, including hip: Secondary | ICD-10-CM | POA: Diagnosis not present

## 2020-07-18 DIAGNOSIS — Z85828 Personal history of other malignant neoplasm of skin: Secondary | ICD-10-CM | POA: Diagnosis not present

## 2020-07-18 DIAGNOSIS — L570 Actinic keratosis: Secondary | ICD-10-CM | POA: Diagnosis not present

## 2020-07-26 DIAGNOSIS — H2513 Age-related nuclear cataract, bilateral: Secondary | ICD-10-CM | POA: Diagnosis not present

## 2020-07-26 DIAGNOSIS — H524 Presbyopia: Secondary | ICD-10-CM | POA: Diagnosis not present

## 2020-07-26 DIAGNOSIS — H52203 Unspecified astigmatism, bilateral: Secondary | ICD-10-CM | POA: Diagnosis not present

## 2020-07-26 DIAGNOSIS — H5203 Hypermetropia, bilateral: Secondary | ICD-10-CM | POA: Diagnosis not present

## 2020-08-16 DIAGNOSIS — H6121 Impacted cerumen, right ear: Secondary | ICD-10-CM | POA: Diagnosis not present

## 2020-08-16 DIAGNOSIS — H6122 Impacted cerumen, left ear: Secondary | ICD-10-CM | POA: Diagnosis not present

## 2020-08-16 DIAGNOSIS — H6123 Impacted cerumen, bilateral: Secondary | ICD-10-CM | POA: Diagnosis not present

## 2020-08-16 DIAGNOSIS — H903 Sensorineural hearing loss, bilateral: Secondary | ICD-10-CM | POA: Diagnosis not present

## 2020-08-28 ENCOUNTER — Other Ambulatory Visit: Payer: Self-pay | Admitting: Internal Medicine

## 2020-08-31 ENCOUNTER — Other Ambulatory Visit: Payer: Self-pay | Admitting: Internal Medicine

## 2020-09-08 ENCOUNTER — Ambulatory Visit (INDEPENDENT_AMBULATORY_CARE_PROVIDER_SITE_OTHER): Payer: PPO

## 2020-09-08 ENCOUNTER — Encounter: Payer: Self-pay | Admitting: Internal Medicine

## 2020-09-08 ENCOUNTER — Other Ambulatory Visit: Payer: Self-pay

## 2020-09-08 ENCOUNTER — Ambulatory Visit (INDEPENDENT_AMBULATORY_CARE_PROVIDER_SITE_OTHER): Payer: PPO | Admitting: Internal Medicine

## 2020-09-08 VITALS — BP 126/80 | HR 57 | Temp 97.5°F | Ht 68.0 in | Wt 156.7 lb

## 2020-09-08 DIAGNOSIS — G8929 Other chronic pain: Secondary | ICD-10-CM

## 2020-09-08 DIAGNOSIS — M5442 Lumbago with sciatica, left side: Secondary | ICD-10-CM

## 2020-09-08 DIAGNOSIS — M545 Low back pain, unspecified: Secondary | ICD-10-CM | POA: Diagnosis not present

## 2020-09-08 MED ORDER — PREDNISONE 10 MG (21) PO TBPK: ORAL_TABLET | ORAL | 0 refills | Status: DC

## 2020-09-08 MED ORDER — METHYLPREDNISOLONE ACETATE 80 MG/ML IJ SUSP
80.0000 mg | Freq: Once | INTRAMUSCULAR | Status: AC
  Administered 2020-09-08: 80 mg via INTRAMUSCULAR

## 2020-09-08 NOTE — Progress Notes (Signed)
Established Patient Office Visit     This visit occurred during the SARS-CoV-2 public health emergency.  Safety protocols were in place, including screening questions prior to the visit, additional usage of staff PPE, and extensive cleaning of exam room while observing appropriate contact time as indicated for disinfecting solutions.    CC/Reason for Visit: Discuss left leg pain  HPI: Edward Berger is a 74 y.o. male who is coming in today for the above mentioned reasons.  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  He has occasional flareups.  Despite this he remains an active runner, jogger.  Lately though his left thigh has started to hurt.  He has been taking 600 to 800 mg of ibuprofen almost daily.  He describes pain on the anterior thigh as achy.  Sometimes if he bends over he can feel a sharp shooting pain down the front of his thigh.  His hip joint is not affected.  Pain does not go below the knee.  He has never had numbness.  He denies saddle anesthesia, fever, bowel or bladder incontinence.   Past Medical/Surgical History: Past Medical History:  Diagnosis Date  . Blood transfusion without reported diagnosis    years ago with surgery   . Cervical radiculopathy, chronic     Past Surgical History:  Procedure Laterality Date  . ANKLE SURGERY     right  . COLONOSCOPY    . ENT     nasal polyp removal   . SHOULDER SURGERY     left    Social History:  reports that he has never smoked. He has never used smokeless tobacco. He reports current alcohol use. He reports that he does not use drugs.  Allergies: No Known Allergies  Family History:  Family History  Problem Relation Age of Onset  . Heart disease Mother   . Cancer Father        prostate  . Colon cancer Neg Hx   . Colon polyps Neg Hx   . Esophageal cancer Neg Hx   . Rectal cancer Neg Hx   . Stomach cancer Neg Hx      Current Outpatient Medications:  .  finasteride (PROPECIA) 1 MG  tablet, TAKE ONE TABLET BY MOUTH ONE TIME DAILY, Disp: 90 tablet, Rfl: 0 .  ibuprofen (ADVIL,MOTRIN) 200 MG tablet, Take 400 mg by mouth every 6 (six) hours as needed for pain., Disp: , Rfl:  .  Multiple Vitamin (MULTIVITAMIN) tablet, Take 1 tablet by mouth daily., Disp: , Rfl:  .  nitroGLYCERIN (NITRODUR - DOSED IN MG/24 HR) 0.2 mg/hr patch, Place 1/4 to 1/2 of a patch over affected region. Remove and replace once daily.  Slightly alter skin placement daily, Disp: 30 patch, Rfl: 1 .  Omega-3 Fatty Acids (FISH OIL) 1000 MG CAPS, Take 1 capsule by mouth daily., Disp: , Rfl:  .  predniSONE (STERAPRED UNI-PAK 21 TAB) 10 MG (21) TBPK tablet, Take as directed, Disp: 21 tablet, Rfl: 0  Current Facility-Administered Medications:  .  methylPREDNISolone acetate (DEPO-MEDROL) injection 80 mg, 80 mg, Intramuscular, Once, Isaac Bliss, Rayford Halsted, MD  Review of Systems:  Constitutional: Denies fever, chills, diaphoresis, appetite change and fatigue.  HEENT: Denies photophobia, eye pain, redness, hearing loss, ear pain, congestion, sore throat, rhinorrhea, sneezing, mouth sores, trouble swallowing, neck pain, neck stiffness and tinnitus.   Respiratory: Denies SOB, DOE, cough, chest tightness,  and wheezing.   Cardiovascular: Denies chest pain, palpitations and leg swelling.  Gastrointestinal: Denies nausea, vomiting, abdominal pain, diarrhea, constipation, blood in stool and abdominal distention.  Genitourinary: Denies dysuria, urgency, frequency, hematuria, flank pain and difficulty urinating.  Endocrine: Denies: hot or cold intolerance, sweats, changes in hair or nails, polyuria, polydipsia. Musculoskeletal: Denies  joint swelling, arthralgias and gait problem.  Skin: Denies pallor, rash and wound.  Neurological: Denies dizziness, seizures, syncope, weakness, light-headedness, numbness and headaches.  Hematological: Denies adenopathy. Easy bruising, personal or family bleeding history   Psychiatric/Behavioral: Denies suicidal ideation, mood changes, confusion, nervousness, sleep disturbance and agitation    Physical Exam: Vitals:   09/08/20 1057  BP: 126/80  Pulse: (!) 57  Temp: (!) 97.5 F (36.4 C)  TempSrc: Oral  SpO2: 98%  Weight: 156 lb 11.2 oz (71.1 kg)  Height: 5\' 8"  (1.727 m)    Body mass index is 23.83 kg/m.   Constitutional: NAD, calm, comfortable Eyes: PERRL, lids and conjunctivae normal, wears corrective lenses ENMT: Mucous membranes are moist.  Respiratory: clear to auscultation bilaterally, no wheezing, no crackles. Normal respiratory effort. No accessory muscle use.  Cardiovascular: Regular rate and rhythm, no murmurs / rubs / gallops. No extremity edema.  Psychiatric: Normal judgment and insight. Alert and oriented x 3. Normal mood.    Impression and Plan:  Chronic low back pain with left-sided sciatica, unspecified back pain laterality -Acute flareup of his low back pain with left leg radiculopathy. -I will go ahead and shoot a lumbar spine x-ray today. -He will get 80 mg of methylprednisolone in office today followed by a 6-day prednisone taper. -Referral to physical therapy today. -Hopefully he can back off on the amount of daily ibuprofen that he has been taking as I am concerned about potential side effects such as gastritis/peptic ulcer disease as well as kidney issues. -Icing might be helpful. -He knows to contact me if no signs of improvement.   Patient Instructions  -Nice seeing you today!!  -Steroid injection today.  -Start prednisone as directed on package insert for 6 days.  -Referral to PT today.  -You may find icing helpful. Avoid heat.     Lelon Frohlich, MD [facility]

## 2020-09-08 NOTE — Patient Instructions (Signed)
-  Nice seeing you today!!  -Steroid injection today.  -Start prednisone as directed on package insert for 6 days.  -Referral to PT today.  -You may find icing helpful. Avoid heat.

## 2020-09-11 ENCOUNTER — Telehealth: Payer: Self-pay | Admitting: Internal Medicine

## 2020-09-11 NOTE — Telephone Encounter (Signed)
Patient is calling and stated that he received a message stating to return the call, please advise. CB is [phone]

## 2020-09-14 ENCOUNTER — Other Ambulatory Visit: Payer: Self-pay | Admitting: Internal Medicine

## 2020-09-14 DIAGNOSIS — M5442 Lumbago with sciatica, left side: Secondary | ICD-10-CM

## 2020-09-14 DIAGNOSIS — G8929 Other chronic pain: Secondary | ICD-10-CM

## 2020-09-14 MED ORDER — PREDNISONE 10 MG (21) PO TBPK: ORAL_TABLET | ORAL | 0 refills | Status: DC

## 2020-09-20 ENCOUNTER — Encounter: Payer: Self-pay | Admitting: Internal Medicine

## 2020-09-25 ENCOUNTER — Encounter: Payer: Self-pay | Admitting: Internal Medicine

## 2020-09-27 ENCOUNTER — Other Ambulatory Visit: Payer: Self-pay | Admitting: Internal Medicine

## 2020-09-27 DIAGNOSIS — G8929 Other chronic pain: Secondary | ICD-10-CM

## 2020-09-27 DIAGNOSIS — M5442 Lumbago with sciatica, left side: Secondary | ICD-10-CM

## 2020-09-27 MED ORDER — PREDNISONE 10 MG (21) PO TBPK: ORAL_TABLET | ORAL | 0 refills | Status: DC

## 2020-10-13 DIAGNOSIS — H6122 Impacted cerumen, left ear: Secondary | ICD-10-CM | POA: Diagnosis not present

## 2020-10-18 ENCOUNTER — Ambulatory Visit: Payer: PPO

## 2020-10-19 ENCOUNTER — Other Ambulatory Visit: Payer: Self-pay

## 2020-10-19 ENCOUNTER — Ambulatory Visit: Payer: PPO | Attending: Internal Medicine | Admitting: Physical Therapy

## 2020-10-19 ENCOUNTER — Ambulatory Visit
Admission: RE | Admit: 2020-10-19 | Discharge: 2020-10-19 | Disposition: A | Discharge status: Home / Self Care | Payer: PPO | Source: Ambulatory Visit | Attending: Internal Medicine | Admitting: Internal Medicine

## 2020-10-19 DIAGNOSIS — M545 Low back pain, unspecified: Secondary | ICD-10-CM | POA: Diagnosis not present

## 2020-10-19 DIAGNOSIS — G8929 Other chronic pain: Secondary | ICD-10-CM

## 2020-10-19 DIAGNOSIS — M6281 Muscle weakness (generalized): Secondary | ICD-10-CM

## 2020-10-19 DIAGNOSIS — M79651 Pain in right thigh: Secondary | ICD-10-CM

## 2020-10-19 DIAGNOSIS — M48061 Spinal stenosis, lumbar region without neurogenic claudication: Secondary | ICD-10-CM | POA: Diagnosis not present

## 2020-10-19 DIAGNOSIS — M5442 Lumbago with sciatica, left side: Secondary | ICD-10-CM

## 2020-10-19 NOTE — Patient Instructions (Signed)
Access Code: [email or web address] URL: [email or web address] Date: 10/19/2020 Prepared by: Ruben Im  Exercises Hip Flexor Stretch at [facility] of Bed - 1 x daily - 7 x weekly - 1 sets - 3 reps - 30 hold Clamshell - 1 x daily - 7 x weekly - 2 sets - 10 reps Sidelying Hip Abduction - 1 x daily - 7 x weekly - 2 sets - 10 reps Prone Femoral Nerve Mobilization - 1 x daily - 7 x weekly - 1 sets - 10 reps

## 2020-10-19 NOTE — Therapy (Signed)
[facility] [address], [place], [place], [address] Phone: [phone]   Fax:  [phone]  Physical Therapy Evaluation  Patient Details  Name: Edward Berger MRN: [id number] Date of Birth: [birth year] Referring Provider (PT): Dr. Isaac Bliss   Encounter Date: 10/19/2020   PT End of Session - 10/19/20 1916    Visit Number 1    Date for PT Re-Evaluation 01/11/21    PT Start Time 1230    PT Stop Time 1318    PT Time Calculation (min) 48 min    Activity Tolerance Patient tolerated treatment well           Past Medical History:  Diagnosis Date  . Blood transfusion without reported diagnosis    years ago with surgery   . Cervical radiculopathy, chronic     Past Surgical History:  Procedure Laterality Date  . ANKLE SURGERY     right  . COLONOSCOPY    . ENT     nasal polyp removal   . SHOULDER SURGERY     left    There were no vitals filed for this visit.    Subjective Assessment - 10/19/20 1236    Subjective I'm an active person;  several years ago had Achilles issue that resolved but also had LBP tightness;  went to chiro and it was a horrible experience and started having pain in leg.  Walking sometimes bothers.  Left anterior thigh.  Felt good while on Prednisone but now back to baseline.    Pertinent History Mon-Friday do stretches, walk 5-10 minutes then run or jog 30 minutes if able;  planks, push ups, leg lifts side and back, bicycle crunches;  free weights for shoulders    How long can you sit comfortably? Ok for work    How long can you stand comfortably? generally OK    How long can you walk comfortably? variable; I can even jog sometimes    Diagnostic tests x-ray years ago stenosis in back and neck :  MRI this morning    Patient Stated Goals Be able to exercise painfree;  continue to jog if possible;  used to swim competitively when younger but I'd rather not do that.    Currently in Pain?  Yes    Pain Score 1     Pain Location Other (Comment)    Pain Orientation Left    Pain Descriptors / Indicators Aching;Dull    Pain Type Chronic pain    Pain Onset More than a month ago    Pain Frequency Intermittent    Aggravating Factors  cautious with bending/lifting; worse in AM; worse at night/end of the day    Pain Relieving Factors better as the day goes on; motrin              [facility] PT Assessment - 10/19/20 0001      Assessment   Medical Diagnosis left sciatica    Referring Provider (PT) Dr. Isaac Bliss    Onset Date/Surgical Date --   6 months   Next MD Visit not scheduled    Prior Therapy 10 years ago HS      Precautions   Precautions None      Restrictions   Weight Bearing Restrictions No      Balance Screen   Has the patient fallen in the past 6 months No    Has the patient had a decrease in activity level because of a fear of falling?  No    Is the patient reluctant to leave their home because of a fear of falling?  No      Home Ecologist residence      Prior Function   Level of Independence Independent    Vocation Full time employment    Vocation Requirements sit drive, mobile    Leisure work      Observation/Other Assessments   Focus on Therapeutic Outcomes (FOTO)  65%      Posture/Postural Control   Posture/Postural Control No significant limitations    Posture Comments Difficulty single leg standing on left < 3 sec      AROM   Overall AROM Comments repeated movement testing of lumbar spine in standing and lying does not produce left thigh symptoms    Lumbar Flexion WFLs    Lumbar Extension 10    Lumbar - Right Side Bend 15    Lumbar - Left Side Bend 25      Strength   Overall Strength Comments uses iliopsoas to compensate for weakness in hip abductors    Right Hip Flexion 5/5    Right Hip Extension 5/5    Right Hip ABduction 4+/5    Left Hip Flexion 5/5    Left Hip Extension 4+/5    Left Hip  ABduction 4/5    Left Knee Flexion 5/5    Left Knee Extension 4+/5      Flexibility   Soft Tissue Assessment /Muscle Length yes    Hamstrings HS 70 degrees bil    Quadriceps dec left quad/hip flexor length      Palpation   Palpation comment no tender points      Slump test   Findings Negative    Side Left      Prone Knee Bend Test   Findings Negative    Comment restriction on left but does not reproduce symptoms      Straight Leg Raise   Findings Negative                      Objective measurements completed on examination: See above findings.               PT Education - 10/19/20 1353    Education Details femoral nerve glide; clams, sidelying hip abduction; hip flexor stretch over edge of bed    Person(s) Educated Patient    Methods Explanation;Demonstration;Handout    Comprehension Returned demonstration;Verbalized understanding            PT Short Term Goals - 10/19/20 1947      PT SHORT TERM GOAL #1   Title The patient will be instructed in initial muscle lengthening and neural gliding ex's    Time 6    Period Weeks    Status New    Target Date 11/30/20      PT SHORT TERM GOAL #2   Title The patient will report a 30% reduction in left anterior thigh pain with standing, walking and jogging    Time 6    Period Weeks    Status New             PT Long Term Goals - 10/19/20 1948      PT LONG TERM GOAL #1   Title The patient will be independent in safe self progression of HEP    Time 12    Period Weeks    Status New    Target Date 01/11/21  PT LONG TERM GOAL #2   Title The patient will report a 60% improvement in left anterior thigh pain in the AM, evenings, standing, walking and jogging    Time 12    Period Weeks    Status New      PT LONG TERM GOAL #3   Title The patient will have improved left hip abductor, hip extensor and knee extensor strength to grossly 4+/5 to 5-/5 needed for jogging    Time 12    Period  Weeks    Status New      PT LONG TERM GOAL #4   Title FOTO functional outcome score improved from 65% to 72% indicating a functional improvement and less pain with ADLs    Time 12    Period Weeks    Status New                  Plan - 10/19/20 1350    Clinical Impression Statement The patient is an active 75 year old who complains of several years of left anterior thigh pain worsening over the last several months.  He denies LBP.  He reports the pain is random but in general worse in the mornings, then end of the day and sometimes walking and jogging.  He is cautious with bending and lifting.  He is better as the day goes on and with Motrin.  Lumbar ROM WFLS but stiffness with extension and right sidebending.  Repeated movement testing of the lumbar spine does not produce left thigh symptoms.  He does have difficulty stabilizing on left with single leg standing.  Mild weakness with left hip abduction, hip extension and knee extension.  Medial knee inward collapse with 6 inch step down test on left.   Negative SLR and slump.  Negative prone knee bend test but some soft tissue restriction on left.  He does not complain of any thigh symptoms with provocation tests however he reports mild thigh discomfort as leaving the facility.    Personal Factors and Comorbidities Past/Current Experience;Time since onset of injury/illness/exacerbation    Examination-Activity Limitations Locomotion Level;Lift;Stand    Examination-Participation Restrictions Community Activity;Other    Stability/Clinical Decision Making Stable/Uncomplicated    Clinical Decision Making Low    Rehab Potential Good    PT Frequency 1x / week    PT Duration 12 weeks    PT Treatment/Interventions ADLs/Self Care Home Management;Cryotherapy;Electrical Stimulation;Traction;Therapeutic activities;Therapeutic exercise;Manual techniques;Patient/family education;Moist Heat;Dry needling    PT Next Visit Plan review femoral nerve glide and  supine hip flexor stretch possibly add 1/2 kneel hip flexor stretch;  start standing hip abduction wall isometrics; teach hip hinge for dead lifts and single leg dead lifts;  step downs 4 inch initially;  left hip mobs; follow up on results of MRI    PT Home Exercise Plan [id number]           Patient will benefit from skilled therapeutic intervention in order to improve the following deficits and impairments:  Pain,Decreased strength,Decreased range of motion,Impaired flexibility  Visit Diagnosis: Pain in right thigh - Plan: PT plan of care cert/re-cert  Muscle weakness (generalized) - Plan: PT plan of care cert/re-cert     Problem List Patient Active Problem List   Diagnosis Date Noted  . 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 NECK PAIN 10/27/2008   Ruben Im, PT 10/19/20 7:56 PM Phone: [phone] Fax: [phone] Alvera Singh 10/19/2020, 7:55 PM  [facility] [address],  [place], [place], [address] Phone: [phone]   Fax:  [phone]  Name: Narayan Scull MRN: [id number] Date of Birth: Oct 26, 1945

## 2020-10-23 ENCOUNTER — Ambulatory Visit (INDEPENDENT_AMBULATORY_CARE_PROVIDER_SITE_OTHER): Payer: PPO | Admitting: Family Medicine

## 2020-10-23 ENCOUNTER — Other Ambulatory Visit: Payer: Self-pay

## 2020-10-23 ENCOUNTER — Encounter: Payer: Self-pay | Admitting: Family Medicine

## 2020-10-23 VITALS — BP 128/80 | HR 62 | Temp 97.5°F | Ht 68.0 in | Wt 155.0 lb

## 2020-10-23 DIAGNOSIS — K4090 Unilateral inguinal hernia, without obstruction or gangrene, not specified as recurrent: Secondary | ICD-10-CM

## 2020-10-23 NOTE — Patient Instructions (Signed)
Inguinal Hernia, Adult An inguinal hernia develops when fat or the intestines push through a weak spot in a muscle where the leg meets the lower abdomen (groin). This creates a bulge. This kind of hernia could also be:  In the scrotum, if you are male.  In folds of skin around the vagina, if you are male. There are three types of inguinal hernias:  Hernias that can be pushed back into the abdomen (are reducible). This type rarely causes pain.  Hernias that are not reducible (are incarcerated).  Hernias that are not reducible and lose their blood supply (are strangulated). This type of hernia requires emergency surgery. What are the causes? This condition is caused by having a weak spot in the muscles or tissues in your groin. This develops over time. The hernia may poke through the weak spot when you suddenly strain your lower abdominal muscles, such as when you:  Lift a heavy object.  Strain to have a bowel movement. Constipation can lead to straining.  Cough. What increases the risk? This condition is more likely to develop in:  Males.  Pregnant females.  People who: ? Are overweight. ? Work in jobs that require long periods of standing or heavy lifting. ? Have had an inguinal hernia before. ? Smoke or have lung disease. These factors can lead to long-term (chronic) coughing. What are the signs or symptoms? Symptoms may depend on the size of the hernia. Often, a small inguinal hernia has no symptoms. Symptoms of a larger hernia may include:  A bulge in the groin area. This is easier to see when standing. It might not be visible when lying down.  Pain or burning in the groin. This may get worse when lifting, straining, or coughing.  A dull ache or a feeling of pressure in the groin.  An unusual bulge in the scrotum, in males. Symptoms of a strangulated inguinal hernia may include:  A bulge in your groin that is very painful and tender to the touch.  A bulge that  turns red or purple.  Fever, nausea, and vomiting.  Inability to have a bowel movement or to pass gas. How is this diagnosed? This condition is diagnosed based on your symptoms, your medical history, and a physical exam. Your health care provider may feel your groin area and ask you to cough. How is this treated? Treatment depends on the size of your hernia and whether you have symptoms. If you do not have symptoms, your health care provider may have you watch your hernia carefully and have you come in for follow-up visits. If your hernia is large or if you have symptoms, you may need surgery to repair the hernia. Follow these instructions at home: Lifestyle  Avoid lifting heavy objects.  Avoid standing for long periods of time.  Do not use any products that contain nicotine or tobacco. These products include cigarettes, chewing tobacco, and vaping devices, such as e-cigarettes. If you need help quitting, ask your health care provider.  Maintain a healthy weight. Preventing constipation You may need to take these actions to prevent or treat constipation:  Drink enough fluid to keep your urine pale yellow.  Take over-the-counter or prescription medicines.  Eat foods that are high in fiber, such as beans, whole grains, and fresh fruits and vegetables.  Limit foods that are high in fat and processed sugars, such as fried or sweet foods. General instructions  You may try to push the hernia back in place by very gently   pressing on it while lying down. Do not try to force the bulge back in if it will not push in easily.  Watch your hernia for any changes in shape, size, or color. Get help right away if you notice any changes.  Take over-the-counter and prescription medicines only as told by your health care provider.  Keep all follow-up visits. This is important. Contact a health care provider if:  You have a fever or chills.  You develop new symptoms.  Your symptoms get  worse. Get help right away if:  You have pain in your groin that suddenly gets worse.  You have a bulge in your groin that: ? Suddenly gets bigger and does not get smaller. ? Becomes red or purple or painful to the touch.  You are a man and you have a sudden pain in your scrotum, or the size of your scrotum suddenly changes.  You cannot push the hernia back in place by very gently pressing on it when you are lying down.  You have nausea or vomiting that does not go away.  You have a fast heartbeat.  You cannot have a bowel movement or pass gas. These symptoms may represent a serious problem that is an emergency. Do not wait to see if the symptoms will go away. Get medical help right away. Call your local emergency services (911 in the [place] Summary  An inguinal hernia develops when fat or the intestines push through a weak spot in a muscle where your leg meets your lower abdomen (groin).  This condition is caused by having a weak spot in muscles or tissues in your groin.  Symptoms may depend on the size of the hernia, and they may include pain or swelling in your groin. A small inguinal hernia often has no symptoms.  Treatment may not be needed if you do not have symptoms. If you have symptoms or a large hernia, you may need surgery to repair the hernia.  Avoid lifting heavy objects. Also, avoid standing for long periods of time. This information is not intended to replace advice given to you by your health care provider. Make sure you discuss any questions you have with your health care provider. Document Revised: 05/16/2020 Document Reviewed: 05/16/2020 Elsevier Patient Education  2021 Elsevier Inc.  

## 2020-10-23 NOTE — Progress Notes (Signed)
Subjective:    Patient ID: Edward Berger, male    DOB: October 19, 1945, 75 y.o.   MRN: [id number]  No chief complaint on file.   HPI Patient is a 75 yo male with pmh sig for Achilles tendon disorder cervical radiculopathy who is followed by Dr. Jerilee Hoh and seen today for acute concern.  Pt with fullness in L upper groin.  Patient noticed this 3 days ago while in the shower.  Patient denies heavy lifting, pushing, pulling, h/o hernia, pain in the area, scrotal edema, or erythema.  Pt does work out/do stretching regularly for his back.  Has herniated disc.  Pt concerned as he has a 2-week trip planned in [place] in mid March with his wife.  Past Medical History:  Diagnosis Date  . Blood transfusion without reported diagnosis    years ago with surgery   . Cervical radiculopathy, chronic     No Known Allergies  ROS General: Denies fever, chills, night sweats, changes in weight, changes in appetite HEENT: Denies headaches, ear pain, changes in vision, rhinorrhea, sore throat CV: Denies CP, palpitations, SOB, orthopnea Pulm: Denies SOB, cough, wheezing GI: Denies abdominal pain, nausea, vomiting, diarrhea, constipation GU: Denies dysuria, hematuria, frequency Msk: Denies muscle cramps, joint pains   Neuro: Denies weakness, numbness, tingling Skin: Denies rashes, bruising + in left upper groin Psych: Denies depression, anxiety, hallucinations      Objective:    Blood pressure 128/80, pulse 62, temperature (!) 97.5 F (36.4 C), temperature source Oral, height 5\' 8"  (1.727 m), weight 155 lb (70.3 kg), SpO2 97 %.  Gen. Pleasant, well-nourished, in no distress, normal affect   HEENT: [place]/AT, face symmetric, conjunctiva clear, no scleral icterus, PERRLA, EOMI, nares patent without drainage Lungs: no accessory muscle use Cardiovascular: RRR, no peripheral edema GU: Left groin> right groin.  On visual inspection a fullness noted in left upper groin.  No TTP of bilateral inguinal areas.   Fullness in left upper groin soft, nonreducible. Musculoskeletal: No deformities, no cyanosis or clubbing, normal tone Neuro:  A&Ox3, CN II-XII intact, normal gait Skin:  Warm, no lesions/ rash   Wt Readings from Last 3 Encounters:  10/23/20 155 lb (70.3 kg)  09/08/20 156 lb 11.2 oz (71.1 kg)  04/18/20 153 lb 9.6 oz (69.7 kg)    Lab Results  Component Value Date   WBC 7.0 04/18/2020   HGB 14.1 04/18/2020   HCT 42.0 04/18/2020   PLT 194 04/18/2020   GLUCOSE 85 04/18/2020   CHOL 187 06/18/2019   TRIG 81.0 06/18/2019   HDL 63.80 06/18/2019   LDLCALC 107 (H) 06/18/2019   ALT 14 06/18/2019   AST 17 06/18/2019   NA 142 04/18/2020   K 3.9 04/18/2020   CL 104 04/18/2020   CREATININE 1.26 (H) 04/18/2020   BUN 25 04/18/2020   CO2 27 04/18/2020   TSH 1.41 04/18/2020   PSA 0.27 09/26/2014   HGBA1C 5.6 06/18/2019    Assessment/Plan:  Unilateral inguinal hernia without obstruction or gangrene, recurrence not specified -Left inguinal hernia likely given exam -Discussed possible causes of hernia -Discussed obtaining ultrasound.  We will then place referral to general surgeon if needed. -Given handout -Given strict precautions for increased or worsening symptoms - Plan: US Scrotum  F/u as needed  Grier Mitts, MD

## 2020-10-24 ENCOUNTER — Telehealth: Payer: Self-pay

## 2020-10-24 NOTE — Telephone Encounter (Signed)
[facility] called regarding pt order for [place] for his Scrotum.Radiologist stated that the protocol is to have a doppler done with the [place]. Attempted to call radiologist Conception Oms back left a detailed message to call the office back.

## 2020-10-24 NOTE — Telephone Encounter (Signed)
 Imaging called state that they need some clarifications on the [place] order placed yesterday before scheduling pt, stated that Conception Oms will call the office  tomorrow morning when Dr Volanda Napoleon is in the office.

## 2020-10-25 ENCOUNTER — Ambulatory Visit
Admission: RE | Admit: 2020-10-25 | Discharge: 2020-10-25 | Disposition: A | Discharge status: Home / Self Care | Payer: PPO | Source: Ambulatory Visit | Attending: Family Medicine | Admitting: Family Medicine

## 2020-10-25 DIAGNOSIS — R1909 Other intra-abdominal and pelvic swelling, mass and lump: Secondary | ICD-10-CM | POA: Diagnosis not present

## 2020-10-25 DIAGNOSIS — K4090 Unilateral inguinal hernia, without obstruction or gangrene, not specified as recurrent: Secondary | ICD-10-CM

## 2020-10-25 NOTE — Addendum Note (Signed)
Addended by: Wyvonne Lenz on: 10/25/2020 09:47 AM   Modules accepted: Orders

## 2020-10-25 NOTE — Telephone Encounter (Signed)
Order clarified

## 2020-10-27 ENCOUNTER — Other Ambulatory Visit: Payer: Self-pay | Admitting: Family Medicine

## 2020-10-27 ENCOUNTER — Encounter: Payer: Self-pay | Admitting: Family Medicine

## 2020-10-27 DIAGNOSIS — K4090 Unilateral inguinal hernia, without obstruction or gangrene, not specified as recurrent: Secondary | ICD-10-CM

## 2020-10-29 ENCOUNTER — Encounter: Payer: Self-pay | Admitting: Internal Medicine

## 2020-10-29 DIAGNOSIS — G8929 Other chronic pain: Secondary | ICD-10-CM

## 2020-11-02 NOTE — Telephone Encounter (Signed)
Spoke with pt state that he is already scheduled for an appointment at the [facility]

## 2020-11-06 ENCOUNTER — Encounter: Payer: Self-pay | Admitting: Internal Medicine

## 2020-11-13 ENCOUNTER — Ambulatory Visit: Payer: Self-pay | Admitting: Surgery

## 2020-11-13 DIAGNOSIS — K4090 Unilateral inguinal hernia, without obstruction or gangrene, not specified as recurrent: Secondary | ICD-10-CM | POA: Diagnosis not present

## 2020-11-13 NOTE — H&P (Signed)
[place] Appointment: 11/13/2020 2:30 PM Location: [facility] Patient #: [id number] DOB: 1946/06/05 Married / Language: Cleophus Molt / Race: White Male  History of Present Illness Adin Hector MD; 11/13/2020 3:23 PM) The patient is a 75 year old male who presents with an inguinal hernia. Note for "Inguinal hernia": ` ` ` Patient sent for surgical consultation at the request of Dr Grier Mitts  Chief Complaint: Groin hernia. ` ` The patient is an active male with chronic spine pain and radiculopathy issues. Noticed some left groin swelling in late December. Followed up with primary care 2 weeks ago. Inguinal hernia suspected. Consultation requested. Patient has a trip to [place] mid March and apparently was hoping to have surgery done and recovered before then. Patient's also had some spine issues. Been working with physical therapy and doing exercises but wanted to see neurosurgery to sort that out as well. Ultrasound was done with suspicion of a fat containing left inguinal hernia.  Patient comes in on himself. He just felt the bulge. No severe pain. No cardiac or pulmonary issues. He can't walk more than 20 or 30 minutes before his low back starts to hurt him pretty bad. He is working try to get that under control. He does not smoke. No prior heart attacks. He is not blood thinners. No diabetes. No sleep apnea. Moves his bowels every day. No abdominal groin surgery. Gets up to urinate once or twice a night. No severe nocturia.  (Review of systems as stated in this history (HPI) or in the review of systems. Otherwise all other 12 point ROS are negative) ` ` ###########################################`  This patient encounter took 30 minutes today to perform the following: obtain history, perform exam, review outside records, interpret tests & imaging, counsel the patient on their diagnosis; and, document this encounter, including findings &  plan in the electronic health record (EHR).   Past Surgical History Illene Regulus, CMA; 11/13/2020 2:43 PM) Colon Polyp Removal - Colonoscopy Oral Surgery Tonsillectomy Vasectomy  Diagnostic Studies History Illene Regulus, CMA; 11/13/2020 2:43 PM) Colonoscopy 1-5 years ago  Allergies Lars Mage Spillers, CMA; 11/13/2020 2:43 PM) No Known Drug Allergies [11/13/2020]:  Medication History Illene Regulus, CMA; 11/13/2020 2:45 PM) Finasteride (1MG  Tablet, Oral) Active. Medications Reconciled  Social History Illene Regulus, CMA; 11/13/2020 2:43 PM) Alcohol use Moderate alcohol use. Caffeine use Coffee. No drug use Tobacco use Never smoker.  Family History Illene Regulus, CMA; 11/13/2020 2:43 PM) Diabetes Mellitus Mother. Heart Disease Mother. Prostate Cancer Father.  Other Problems Illene Regulus, CMA; 11/13/2020 2:43 PM) Back Pain     Review of Systems (Alisha Spillers CMA; 11/13/2020 2:43 PM) General Not Present- Appetite Loss, Chills, Fatigue, Fever, Night Sweats, Weight Gain and Weight Loss. Skin Not Present- Change in Wart/Mole, Dryness, Hives, Jaundice, New Lesions, Non-Healing Wounds, Rash and Ulcer. HEENT Present- Hearing Loss. Not Present- Earache, Hoarseness, Nose Bleed, Oral Ulcers, Ringing in the Ears, Seasonal Allergies, Sinus Pain, Sore Throat, Visual Disturbances, Wears glasses/contact lenses and Yellow Eyes. Respiratory Not Present- Bloody sputum, Chronic Cough, Difficulty Breathing, Snoring and Wheezing. Breast Not Present- Breast Mass, Breast Pain, Nipple Discharge and Skin Changes. Cardiovascular Not Present- Chest Pain, Difficulty Breathing Lying Down, Leg Cramps, Palpitations, Rapid Heart Rate, Shortness of Breath and Swelling of Extremities. Gastrointestinal Not Present- Abdominal Pain, Bloating, Bloody Stool, Change in Bowel Habits, Chronic diarrhea, Constipation, Difficulty Swallowing, Excessive gas, Gets full quickly at meals,  Hemorrhoids, Indigestion, Nausea, Rectal Pain and Vomiting. Male Genitourinary Not Present- Blood in Urine,  Change in Urinary Stream, Frequency, Impotence, Nocturia, Painful Urination, Urgency and Urine Leakage. Musculoskeletal Present- Back Pain. Not Present- Joint Pain, Joint Stiffness, Muscle Pain, Muscle Weakness and Swelling of Extremities. Neurological Not Present- Decreased Memory, Fainting, Headaches, Numbness, Seizures, Tingling, Tremor, Trouble walking and Weakness. Hematology Not Present- Blood Thinners, Easy Bruising, Excessive bleeding, Gland problems, HIV and Persistent Infections.  Vitals (Alisha Spillers CMA; 11/13/2020 2:43 PM) 11/13/2020 2:43 PM Weight: 155 lb Height: 68in Body Surface Area: 1.83 m Body Mass Index: 23.57 kg/m  Pulse: 63 (Regular)  BP: 128/68(Sitting, Left Arm, Standard)        Physical Exam Adin Hector MD; 11/13/2020 3:22 PM)  General Mental Status-Alert. General Appearance-Not in acute distress, Not Sickly. Orientation-Oriented X3. Hydration-Well hydrated. Voice-Normal.  Integumentary Global Assessment Upon inspection and palpation of skin surfaces of the - Axillae: non-tender, no inflammation or ulceration, no drainage. and Distribution of scalp and body hair is normal. General Characteristics Temperature - normal warmth is noted.  Head and Neck Head-normocephalic, atraumatic with no lesions or palpable masses. Face Global Assessment - atraumatic, no absence of expression. Neck Global Assessment - no abnormal movements, no bruit auscultated on the right, no bruit auscultated on the left, no decreased range of motion, non-tender. Trachea-midline. Thyroid Gland Characteristics - non-tender.  Eye Eyeball - Left-Extraocular movements intact, No Nystagmus - Left. Eyeball - Right-Extraocular movements intact, No Nystagmus - Right. Cornea - Left-No Hazy - Left. Cornea - Right-No Hazy -  Right. Sclera/Conjunctiva - Left-No scleral icterus, No Discharge - Left. Sclera/Conjunctiva - Right-No scleral icterus, No Discharge - Right. Pupil - Left-Direct reaction to light normal. Pupil - Right-Direct reaction to light normal. Note: Wears glasses. Vision corrected  ENMT Ears Pinna - Left - no drainage observed, no generalized tenderness observed. Pinna - Right - no drainage observed, no generalized tenderness observed. Nose and Sinuses External Inspection of the Nose - no destructive lesion observed. Inspection of the nares - Left - quiet respiration. Inspection of the nares - Right - quiet respiration. Mouth and Throat Lips - Upper Lip - no fissures observed, no pallor noted. Lower Lip - no fissures observed, no pallor noted. Nasopharynx - no discharge present. Oral Cavity/Oropharynx - Tongue - no dryness observed. Oral Mucosa - no cyanosis observed. Hypopharynx - no evidence of airway distress observed.  Chest and Lung Exam Inspection Movements - Normal and Symmetrical. Accessory muscles - No use of accessory muscles in breathing. Palpation Palpation of the chest reveals - Non-tender. Auscultation Breath sounds - Normal and Clear.  Cardiovascular Auscultation Rhythm - Regular. Murmurs & Other Heart Sounds - Auscultation of the heart reveals - No Murmurs and No Systolic Clicks.  Abdomen Inspection Inspection of the abdomen reveals - No Visible peristalsis and No Abnormal pulsations. Umbilicus - No Bleeding, No Urine drainage. Palpation/Percussion Palpation and Percussion of the abdomen reveal - Soft, Non Tender, No Rebound tenderness, No Rigidity (guarding) and No Cutaneous hyperesthesia. Note: Abdomen soft. Nontender. Not distended. No umbilical or incisional hernias. No guarding.  Male Genitourinary Sexual Maturity Tanner 5 - Adult hair pattern and Adult penile size and shape. Note: Small left inguinal hernia reducible. Minimal sensitivity. Probable  impulse on this right side with Valsalva  Otherwise normal external male genitalia.  Peripheral Vascular Upper Extremity Inspection - Left - No Cyanotic nailbeds - Left, Not Ischemic. Inspection - Right - No Cyanotic nailbeds - Right, Not Ischemic.  Neurologic Neurologic evaluation reveals -normal attention span and ability to concentrate, able to name objects and repeat phrases.  Appropriate fund of knowledge , normal sensation and normal coordination. Mental Status Affect - not angry, not paranoid. Cranial Nerves-Normal Bilaterally. Gait-Normal.  Neuropsychiatric Mental status exam performed with findings of-able to articulate well with normal speech/language, rate, volume and coherence, thought content normal with ability to perform basic computations and apply abstract reasoning and no evidence of hallucinations, delusions, obsessions or homicidal/suicidal ideation.  Musculoskeletal Global Assessment Spine, Ribs and Pelvis - no instability, subluxation or laxity. Right Upper Extremity - no instability, subluxation or laxity. Note: Mild low back discomfort to lumbar percussion  Lymphatic Head & Neck  General Head & Neck Lymphatics: Bilateral - Description - No Localized lymphadenopathy. Axillary  General Axillary Region: Bilateral - Description - No Localized lymphadenopathy. Femoral & Inguinal  Generalized Femoral & Inguinal Lymphatics: Left - Description - No Localized lymphadenopathy. Right - Description - No Localized lymphadenopathy.    Assessment & Plan Adin Hector MD; 11/13/2020 3:21 PM)  LEFT INGUINAL HERNIA (K40.90) Impression: Definite left inguinal hernia. Optically symptomatic nor large. Possible contralateral inguinal hernial-pulse on the right side.  At some point, I think he would benefit from hernia repair. Good candidate for an outpatient laparoscopic approach. He has a 2 week trip to [place] next month. I noted I was skeptical that we  can get this done & have him fully recovered before them.  The most conservative option is postpone his vacation, do the surgery, and then plan going down there in 4-6 weeks. However, I think that'll be a challenge for him to reschedule. He is not symptomatic and it is very small and only noted in the past month or so. I think it'll most likely will be safe for him to go down to [place] and then have surgery repair when he comes back in mid March. He is leaning towards that as well. He asked many appropriate questions. I believe I satisfied them.  He is interested in proceeding. We'll work to schedule it at a convenient time   [facility] EXAM[facility]ATION FOR GENERAL SURGICAL PROCEDURE (Z01.818)  Current Plans You are being scheduled for surgery- Our schedulers will call you.  You should hear from our office's scheduling department within 5 working days about the location, date, and time of surgery. We try to make accommodations for patient's preferences in scheduling surgery, but sometimes the OR schedule or the surgeon's schedule prevents [place] from making those accommodations.  If you have not heard from our office [phone]) in 5 working days, call the office and ask for your surgeon's nurse.  If you have other questions about your diagnosis, plan, or surgery, call the office and ask for your surgeon's nurse.  Written instructions provided The anatomy & physiology of the abdominal wall and pelvic floor was discussed. The pathophysiology of hernias in the inguinal and pelvic region was discussed. Natural history risks such as progressive enlargement, pain, incarceration, and strangulation was discussed. Contributors to complications such as smoking, obesity, diabetes, prior surgery, etc were discussed.  I feel the risks of no intervention will lead to serious problems that outweigh the operative risks; therefore, I recommended surgery to reduce  and repair the hernia. I explained laparoscopic techniques with possible need for an open approach. I noted usual use of mesh to patch and/or buttress hernia repair  Risks such as bleeding, infection, abscess, need for further treatment, heart attack, death, and other risks were discussed. I noted a good likelihood this will help address the problem.  Goals of post-operative recovery were discussed as well. Possibility that this will not correct all symptoms was explained. I stressed the importance of low-impact activity, aggressive pain control, avoiding constipation, & not pushing through pain to minimize risk of post-operative chronic pain or injury. Possibility of reherniation was discussed. We will work to minimize complications.  An educational handout further explaining the pathology & treatment options was given as well. Questions were answered. The patient expresses understanding & wishes to proceed with surgery.  Pt Education - Pamphlet Given - Laparoscopic Hernia Repair: discussed with patient and provided information. Pt Education - [facility] Pain Control (Maxim Bedel) Pt Education - [facility] Hernia Post-Op HCI (Mayling Aber): discussed with patient and provided information. Pt Education - [facility] Mesh education: discussed with patient and provided information.  Adin Hector, MD, FACS, MASCRS Gastrointestinal and Minimally Invasive Surgery  [facility] [address] [place], [place] [address] [phone] Fax [phone] Main/Paging  CONTACT INFORMATION: Weekday (9AM-5PM) concerns: Call [facility] main office at [phone] Weeknight (5PM-9AM) or Weekend/Holiday concerns: Check [email or web address] for General Surgery [facility] coverage (Please, do not use SecureChat as it is not reliable communication to operating surgeons for immediate patient care)

## 2020-11-16 DIAGNOSIS — M5416 Radiculopathy, lumbar region: Secondary | ICD-10-CM | POA: Diagnosis not present

## 2020-11-16 DIAGNOSIS — M4316 Spondylolisthesis, lumbar region: Secondary | ICD-10-CM | POA: Diagnosis not present

## 2020-11-20 DIAGNOSIS — H6123 Impacted cerumen, bilateral: Secondary | ICD-10-CM | POA: Diagnosis not present

## 2020-11-28 ENCOUNTER — Other Ambulatory Visit: Payer: Self-pay | Admitting: Internal Medicine

## 2020-11-28 DIAGNOSIS — Z298 Encounter for other specified prophylactic measures: Secondary | ICD-10-CM

## 2020-11-28 MED ORDER — ACETAZOLAMIDE 125 MG PO TABS: ORAL_TABLET | ORAL | 0 refills | Status: DC

## 2020-12-01 DIAGNOSIS — H6122 Impacted cerumen, left ear: Secondary | ICD-10-CM | POA: Diagnosis not present

## 2020-12-04 ENCOUNTER — Other Ambulatory Visit: Payer: Self-pay | Admitting: Internal Medicine

## 2020-12-08 ENCOUNTER — Other Ambulatory Visit: Payer: Self-pay | Admitting: Internal Medicine

## 2020-12-08 ENCOUNTER — Encounter: Payer: Self-pay | Admitting: Internal Medicine

## 2020-12-08 DIAGNOSIS — A09 Infectious gastroenteritis and colitis, unspecified: Secondary | ICD-10-CM

## 2020-12-08 MED ORDER — CIPROFLOXACIN HCL 500 MG PO TABS: 500.0000 mg | ORAL_TABLET | Freq: Every day | ORAL | 0 refills | Status: DC

## 2020-12-11 ENCOUNTER — Other Ambulatory Visit: Payer: PPO

## 2021-03-10 ENCOUNTER — Telehealth: Payer: Self-pay | Admitting: Internal Medicine

## 2021-03-14 NOTE — Telephone Encounter (Signed)
finasteride (PROPECIA) 1 MG tablet   [id number] - [place], [place] Phone:  [phone]  Fax:  [phone]      Patient only as 1 pill left and has been requesting this medication since 06/11

## 2021-03-15 NOTE — Telephone Encounter (Signed)
Rx has already been sent in since the patient called. Nothing further needed

## 2021-03-15 NOTE — Telephone Encounter (Signed)
Patient is calling back to check the status of medication refill request for Propecia. Pt is requesting a call back, please advise. CB is [phone]

## 2021-05-18 DIAGNOSIS — H938X3 Other specified disorders of ear, bilateral: Secondary | ICD-10-CM | POA: Diagnosis not present

## 2021-06-08 ENCOUNTER — Other Ambulatory Visit: Payer: Self-pay | Admitting: Internal Medicine

## 2021-06-09 ENCOUNTER — Other Ambulatory Visit: Payer: Self-pay | Admitting: Internal Medicine

## 2021-07-25 DIAGNOSIS — L821 Other seborrheic keratosis: Secondary | ICD-10-CM | POA: Diagnosis not present

## 2021-07-25 DIAGNOSIS — D2271 Melanocytic nevi of right lower limb, including hip: Secondary | ICD-10-CM | POA: Diagnosis not present

## 2021-07-25 DIAGNOSIS — L578 Other skin changes due to chronic exposure to nonionizing radiation: Secondary | ICD-10-CM | POA: Diagnosis not present

## 2021-07-25 DIAGNOSIS — D225 Melanocytic nevi of trunk: Secondary | ICD-10-CM | POA: Diagnosis not present

## 2021-07-25 DIAGNOSIS — D2272 Melanocytic nevi of left lower limb, including hip: Secondary | ICD-10-CM | POA: Diagnosis not present

## 2021-07-25 DIAGNOSIS — Z85828 Personal history of other malignant neoplasm of skin: Secondary | ICD-10-CM | POA: Diagnosis not present

## 2021-07-25 DIAGNOSIS — B353 Tinea pedis: Secondary | ICD-10-CM | POA: Diagnosis not present

## 2021-07-25 DIAGNOSIS — D2371 Other benign neoplasm of skin of right lower limb, including hip: Secondary | ICD-10-CM | POA: Diagnosis not present

## 2021-07-25 DIAGNOSIS — L570 Actinic keratosis: Secondary | ICD-10-CM | POA: Diagnosis not present

## 2021-07-30 ENCOUNTER — Other Ambulatory Visit: Payer: Self-pay

## 2021-07-30 ENCOUNTER — Ambulatory Visit (INDEPENDENT_AMBULATORY_CARE_PROVIDER_SITE_OTHER): Payer: PPO

## 2021-07-30 DIAGNOSIS — Z23 Encounter for immunization: Secondary | ICD-10-CM | POA: Diagnosis not present

## 2021-07-31 ENCOUNTER — Encounter: Payer: PPO | Admitting: Internal Medicine

## 2021-07-31 DIAGNOSIS — H52203 Unspecified astigmatism, bilateral: Secondary | ICD-10-CM | POA: Diagnosis not present

## 2021-07-31 DIAGNOSIS — H2513 Age-related nuclear cataract, bilateral: Secondary | ICD-10-CM | POA: Diagnosis not present

## 2021-09-04 ENCOUNTER — Telehealth: Payer: Self-pay

## 2021-09-04 NOTE — Telephone Encounter (Signed)
Caller believes he may have had a brief episode of TIA on Sat am & wants to talk to someone. (Did not call over weekend) Had sudden tinglin in right side of tongue & leips & felt fait bu didn't faint. Sx last 20-30 secs & then went away. Nos Sxs today.  Caller states that on Sat he thinks he had a slight TIA on his right side no hx of tia s/s lasted for 30secs lost sensation in his right side of mouth no h/a has no had any more s/s since then does not take any blood thinners.  Pt was advised by triage RN to ED Now. Pt states if more than a 2hr wait in ER he is not saying. Advised if he is not seen in the ER to call back to office to attempt to schedule appt with PCP in the next 2 days. Pt going to [facility] ED.   09/04/21 1022: Pt states he did not go to the ED & will not go to the ED b/c sxs have resolved, the episode was brief, & at this time he has no lingering affects; pt states he wants to figure out why it happened but does not want to go to ED. Pt educated that when s/s similar to his occur, it is most important to get to the the ED as close to the vent as possible no matter how long the event lasted. Pt stated he appreciates what is being said & he understands; if it occurs again before appt with PCP he will consider going to ED. Appt made with PCP for 12/14.

## 2021-09-12 ENCOUNTER — Encounter: Payer: Self-pay | Admitting: Internal Medicine

## 2021-09-12 ENCOUNTER — Ambulatory Visit (INDEPENDENT_AMBULATORY_CARE_PROVIDER_SITE_OTHER): Payer: PPO | Admitting: Internal Medicine

## 2021-09-12 VITALS — BP 130/80 | HR 56 | Temp 97.5°F | Wt 160.8 lb

## 2021-09-12 DIAGNOSIS — R202 Paresthesia of skin: Secondary | ICD-10-CM

## 2021-09-12 DIAGNOSIS — R200 Anesthesia of skin: Secondary | ICD-10-CM | POA: Diagnosis not present

## 2021-09-12 LAB — COMPREHENSIVE METABOLIC PANEL
ALT: 17 U/L (ref 0–53)
AST: 20 U/L (ref 0–37)
Albumin: 4.2 g/dL (ref 3.5–5.2)
Alkaline Phosphatase: 61 U/L (ref 39–117)
BUN: 20 mg/dL (ref 6–23)
CO2: 33 mEq/L — ABNORMAL HIGH (ref 19–32)
Calcium: 9.5 mg/dL (ref 8.4–10.5)
Chloride: 102 mEq/L (ref 96–112)
Creatinine, Ser: 1.04 mg/dL (ref 0.40–1.50)
GFR: 70.1 mL/min (ref >60.00)
Glucose, Bld: 99 mg/dL (ref 70–99)
Potassium: 3.9 mEq/L (ref 3.5–5.1)
Sodium: 140 mEq/L (ref 135–145)
Total Bilirubin: 0.6 mg/dL (ref 0.2–1.2)
Total Protein: 7.1 g/dL (ref 6.0–8.3)

## 2021-09-12 LAB — LIPID PANEL
Cholesterol: 203 mg/dL — ABNORMAL HIGH (ref 0–200)
HDL: 71.2 mg/dL (ref >39.00)
LDL Cholesterol: 109 mg/dL — ABNORMAL HIGH (ref 0–99)
NonHDL: 131.37
Total CHOL/HDL Ratio: 3
Triglycerides: 110 mg/dL (ref 0.0–149.0)
VLDL: 22 mg/dL (ref 0.0–40.0)

## 2021-09-12 MED ORDER — FINASTERIDE 1 MG PO TABS: ORAL_TABLET | ORAL | 1 refills | Status: DC

## 2021-09-12 NOTE — Progress Notes (Signed)
Acute office Visit     This visit occurred during the SARS-CoV-2 public health emergency.  Safety protocols were in place, including screening questions prior to the visit, additional usage of staff PPE, and extensive cleaning of exam room while observing appropriate contact time as indicated for disinfecting solutions.    CC/Reason for Visit: Numbness and tingling right side of face  HPI: Edward Berger is a 75 y.o. male who is coming in today for the above mentioned reasons.  10 days ago, while walking down an aisle at a store, he had the sudden onset of tingling to the right side of his lips and tongue with lightheadedness.  This episode lasted maybe 30 seconds and then resolved spontaneously.  He refused ED evaluation.  He has started taking aspirin 81 mg after his research.  Has been feeling well since.  Past Medical/Surgical History: Past Medical History:  Diagnosis Date   Blood transfusion without reported diagnosis    years ago with surgery    Cervical radiculopathy, chronic     Past Surgical History:  Procedure Laterality Date   ANKLE SURGERY     right   COLONOSCOPY     ENT     nasal polyp removal    SHOULDER SURGERY     left    Social History:  reports that he has never smoked. He has never used smokeless tobacco. He reports current alcohol use. He reports that he does not use drugs.  Allergies: No Known Allergies  Family History:  Family History  Problem Relation Age of Onset   Heart disease Mother    Cancer Father        prostate   Colon cancer Neg Hx    Colon polyps Neg Hx    Esophageal cancer Neg Hx    Rectal cancer Neg Hx    Stomach cancer Neg Hx      Current Outpatient Medications:    aspirin EC 81 MG tablet, Take 81 mg by mouth daily. Swallow whole., Disp: , Rfl:    ibuprofen (ADVIL,MOTRIN) 200 MG tablet, Take 400 mg by mouth every 6 (six) hours as needed for pain., Disp: , Rfl:    Multiple Vitamin (MULTIVITAMIN) tablet, Take 1 tablet  by mouth daily., Disp: , Rfl:    Omega-3 Fatty Acids (FISH OIL) 1000 MG CAPS, Take 1 capsule by mouth daily., Disp: , Rfl:    finasteride (PROPECIA) 1 MG tablet, TAKE ONE TABLET BY MOUTH ONE TIME DAILY, Disp: 90 tablet, Rfl: 1  Review of Systems:  Constitutional: Denies fever, chills, diaphoresis, appetite change and fatigue.  HEENT: Denies photophobia, eye pain, redness, hearing loss, ear pain, congestion, sore throat, rhinorrhea, sneezing, mouth sores, trouble swallowing, neck pain, neck stiffness and tinnitus.   Respiratory: Denies SOB, DOE, cough, chest tightness,  and wheezing.   Cardiovascular: Denies chest pain, palpitations and leg swelling.  Gastrointestinal: Denies nausea, vomiting, abdominal pain, diarrhea, constipation, blood in stool and abdominal distention.  Genitourinary: Denies dysuria, urgency, frequency, hematuria, flank pain and difficulty urinating.  Endocrine: Denies: hot or cold intolerance, sweats, changes in hair or nails, polyuria, polydipsia. Musculoskeletal: Denies myalgias, back pain, joint swelling, arthralgias and gait problem.  Skin: Denies pallor, rash and wound.  Neurological: Denies dizziness, seizures, syncope, weakness, light-headedness, numbness and headaches.  Hematological: Denies adenopathy. Easy bruising, personal or family bleeding history  Psychiatric/Behavioral: Denies suicidal ideation, mood changes, confusion, nervousness, sleep disturbance and agitation    Physical Exam: Vitals:   09/12/21 1131  BP: 130/80  Pulse: (!) 56  Temp: (!) 97.5 F (36.4 C)  TempSrc: Oral  SpO2: 99%  Weight: 160 lb 12.8 oz (72.9 kg)    Body mass index is 24.45 kg/m.   Constitutional: NAD, calm, comfortable Eyes: PERRL, lids and conjunctivae normal, wears corrective lenses ENMT: Mucous membranes are moist.  Neck: normal, supple, no masses, no thyromegaly Respiratory: clear to auscultation bilaterally, no wheezing, no crackles. Normal respiratory effort. No  accessory muscle use.  Cardiovascular: Regular rate and rhythm, no murmurs / rubs / gallops. No extremity edema.  No carotid bruits.  Neurologic: Grossly intact and nonfocal Psychiatric: Normal judgment and insight. Alert and oriented x 3. Normal mood.    Impression and Plan:  Numbness and tingling of right side of face  - Plan: Comprehensive metabolic panel, Lipid panel, CT HEAD WO CONTRAST (5MM), ECHOCARDIOGRAM COMPLETE, VAS US CAROTID -Suspicious for a TIA.  Will initiate work-up with lipids, CT head, carotid Doppler, 2D echo. -Agree with aspirin 81 mg daily. -He has been advised that prompt evaluation in the future during these episodes is necessary.  Time spent: 30 minutes reviewing chart, interviewing and examining patient and formulating plan of care.     Lelon Frohlich, MD [facility]

## 2021-09-18 ENCOUNTER — Encounter: Payer: Self-pay | Admitting: Internal Medicine

## 2021-09-19 ENCOUNTER — Encounter: Payer: Self-pay | Admitting: Internal Medicine

## 2021-09-19 NOTE — Telephone Encounter (Signed)
Sent patient message through [place], letting him know his labs looked good.

## 2021-09-25 ENCOUNTER — Encounter: Payer: Self-pay | Admitting: Internal Medicine

## 2021-09-27 ENCOUNTER — Ambulatory Visit (INDEPENDENT_AMBULATORY_CARE_PROVIDER_SITE_OTHER): Payer: PPO | Admitting: Internal Medicine

## 2021-09-27 ENCOUNTER — Telehealth: Payer: Self-pay | Admitting: Internal Medicine

## 2021-09-27 ENCOUNTER — Encounter (HOSPITAL_COMMUNITY): Payer: PPO

## 2021-09-27 ENCOUNTER — Encounter: Payer: Self-pay | Admitting: Internal Medicine

## 2021-09-27 VITALS — BP 114/72 | HR 55 | Temp 97.6°F | Ht 68.0 in | Wt 158.6 lb

## 2021-09-27 DIAGNOSIS — Z125 Encounter for screening for malignant neoplasm of prostate: Secondary | ICD-10-CM

## 2021-09-27 DIAGNOSIS — R739 Hyperglycemia, unspecified: Secondary | ICD-10-CM | POA: Diagnosis not present

## 2021-09-27 DIAGNOSIS — K602 Anal fissure, unspecified: Secondary | ICD-10-CM | POA: Diagnosis not present

## 2021-09-27 DIAGNOSIS — Z0000 Encounter for general adult medical examination without abnormal findings: Secondary | ICD-10-CM | POA: Diagnosis not present

## 2021-09-27 DIAGNOSIS — E038 Other specified hypothyroidism: Secondary | ICD-10-CM | POA: Diagnosis not present

## 2021-09-27 LAB — VITAMIN D 25 HYDROXY (VIT D DEFICIENCY, FRACTURES): VITD: 34.23 ng/mL (ref 30.00–100.00)

## 2021-09-27 LAB — HEMOGLOBIN A1C: Hgb A1c MFr Bld: 5.7 % (ref 4.6–6.5)

## 2021-09-27 LAB — PSA: PSA: 0.34 ng/mL (ref 0.10–4.00)

## 2021-09-27 LAB — VITAMIN B12: Vitamin B-12: 367 pg/mL (ref 211–911)

## 2021-09-27 LAB — TSH: TSH: 1.03 u[IU]/mL (ref 0.35–5.50)

## 2021-09-27 MED ORDER — HYDROCORTISONE (PERIANAL) 2.5 % EX CREA: 1.0000 "application " | TOPICAL_CREAM | Freq: Two times a day (BID) | CUTANEOUS | 2 refills | Status: DC

## 2021-09-27 NOTE — Telephone Encounter (Signed)
[facility] is calling and would like to know the number of days hydrocortisone (ANUSOL-HC) 2.5 % rectal cream  needs to last

## 2021-09-27 NOTE — Telephone Encounter (Signed)
Spoke with [facility] and let her know Dr said pt can use it for as long as it is needed. Edward Berger states she will put it at 30g per month with refills there if needed.

## 2021-09-27 NOTE — Patient Instructions (Addendum)
-  Nice seeing you today!!  -Lab work today; will notify you once results are available.  -Remember your COVID booster, tetanus and shingles vaccines at the pharmacy.  -Apply anusol cream 3 times a day to your anal fissure  -Schedule follow up in 6 months.

## 2021-09-27 NOTE — Progress Notes (Addendum)
Established Patient Office Visit     This visit occurred during the SARS-CoV-2 public health emergency.  Safety protocols were in place, including screening questions prior to the visit, additional usage of staff PPE, and extensive cleaning of exam room while observing appropriate contact time as indicated for disinfecting solutions.    CC/Reason for Visit: Annual preventive exam and subsequent Medicare wellness visit  HPI: Edward Berger is a 75 y.o. male who is coming in today for the above mentioned reasons. Past Medical History is significant for: Cervical and lumbar spinal stenosis as well as a recent episode of TIA.  His echo, Doppler, CT head have been scheduled and are pending.  He has no acute concerns today.  He has routine eye and dental care.  He wears bilateral hearing aids.  He is overdue for COVID booster, Tdap, shingles vaccinations.  He had a colonoscopy in 2018.  He has some skin breakdown on his gluteal fold that he would like me to look at.   Past Medical/Surgical History: Past Medical History:  Diagnosis Date   Blood transfusion without reported diagnosis    years ago with surgery    Cervical radiculopathy, chronic     Past Surgical History:  Procedure Laterality Date   ANKLE SURGERY     right   COLONOSCOPY     ENT     nasal polyp removal    SHOULDER SURGERY     left    Social History:  reports that he has never smoked. He has never used smokeless tobacco. He reports current alcohol use. He reports that he does not use drugs.  Allergies: No Known Allergies  Family History:  Family History  Problem Relation Age of Onset   Heart disease Mother    Cancer Father        prostate   Colon cancer Neg Hx    Colon polyps Neg Hx    Esophageal cancer Neg Hx    Rectal cancer Neg Hx    Stomach cancer Neg Hx      Current Outpatient Medications:    aspirin EC 81 MG tablet, Take 81 mg by mouth daily. Swallow whole., Disp: , Rfl:    finasteride  (PROPECIA) 1 MG tablet, TAKE ONE TABLET BY MOUTH ONE TIME DAILY, Disp: 90 tablet, Rfl: 1   hydrocortisone (ANUSOL-HC) 2.5 % rectal cream, Place 1 application rectally 2 (two) times daily., Disp: 30 g, Rfl: 2   ibuprofen (ADVIL,MOTRIN) 200 MG tablet, Take 400 mg by mouth every 6 (six) hours as needed for pain., Disp: , Rfl:    Multiple Vitamin (MULTIVITAMIN) tablet, Take 1 tablet by mouth daily., Disp: , Rfl:    Naproxen Sodium (ALEVE PO), Take by mouth. As needed, Disp: , Rfl:    Omega-3 Fatty Acids (FISH OIL) 1000 MG CAPS, Take 1 capsule by mouth daily., Disp: , Rfl:   Review of Systems:  Constitutional: Denies fever, chills, diaphoresis, appetite change and fatigue.  HEENT: Denies photophobia, eye pain, redness, hearing loss, ear pain, congestion, sore throat, rhinorrhea, sneezing, mouth sores, trouble swallowing, neck pain, neck stiffness and tinnitus.   Respiratory: Denies SOB, DOE, cough, chest tightness,  and wheezing.   Cardiovascular: Denies chest pain, palpitations and leg swelling.  Gastrointestinal: Denies nausea, vomiting, abdominal pain, diarrhea, constipation, blood in stool and abdominal distention.  Genitourinary: Denies dysuria, urgency, frequency, hematuria, flank pain and difficulty urinating.  Endocrine: Denies: hot or cold intolerance, sweats, changes in hair or nails, polyuria, polydipsia. Musculoskeletal:  Denies myalgias, back pain, joint swelling, arthralgias and gait problem.  Skin: Denies pallor, rash and wound.  Neurological: Denies dizziness, seizures, syncope, weakness, light-headedness, numbness and headaches.  Hematological: Denies adenopathy. Easy bruising, personal or family bleeding history  Psychiatric/Behavioral: Denies suicidal ideation, mood changes, confusion, nervousness, sleep disturbance and agitation    Physical Exam: Vitals:   09/27/21 0957  BP: 114/72  Pulse: (!) 55  Temp: 97.6 F (36.4 C)  TempSrc: Oral  SpO2: 98%  Weight: 158 lb 9.6 oz  (71.9 kg)  Height: 5\' 8"  (1.727 m)    Body mass index is 24.12 kg/m.   Constitutional: NAD, calm, comfortable Eyes: PERRL, lids and conjunctivae normal, wears corrective lenses ENMT: Mucous membranes are moist. Posterior pharynx clear of any exudate or lesions. Normal dentition. Tympanic membrane is pearly white, no erythema or bulging. Neck: normal, supple, no masses, no thyromegaly Respiratory: clear to auscultation bilaterally, no wheezing, no crackles. Normal respiratory effort. No accessory muscle use.  Cardiovascular: Regular rate and rhythm, no murmurs / rubs / gallops. No extremity edema. 2+ pedal pulses. No carotid bruits.  Abdomen: no tenderness, no masses palpated. No hepatosplenomegaly. Bowel sounds positive.  Musculoskeletal: no clubbing / cyanosis. No joint deformity upper and lower extremities. Good ROM, no contractures. Normal muscle tone.  Skin: Small annular fissure at the superior end of his gluteal fold. Neurologic: CN 2-12 grossly intact. Sensation intact, DTR normal. Strength 5/5 in all 4.  Psychiatric: Normal judgment and insight. Alert and oriented x 3. Normal mood.    Subsequent Medicare wellness visit   1. Risk factors, based on past  M,S,F -cardiovascular disease risk factors include age, gender   2.  Physical activities: He remains active with plyometric activities and walking on a daily basis   3.  Depression/mood: Stable, not depressed   4.  Hearing: Wears bilateral hearing aids   5.  ADL's: Independent in all ADLs   6.  Fall risk: Low fall risk   7.  Home safety: No problems identified   8.  Height weight, and visual acuity: height and weight as above, vision:  Vision Screening   Right eye Left eye Both eyes  Without correction     With correction 20/25 20/25 20/25      9.  Counseling: Advised he update his vaccination status   10. Lab orders based on risk factors: Laboratory update will be reviewed   11. Referral : None today   12.  Care plan: Follow-up with me in 6 months or sooner as needed   13. Cognitive assessment: No cognitive impairment   14. Screening: Patient provided with a written and personalized 5-10 year screening schedule in the AVS. yes   15. Provider List Update: PCP, neurosurgeon  87. Advance Directives: Full code   17. Opioids: Patient is not on any opioid prescriptions and has no risk factors for a substance use disorder.   [facility] Office Visit from 09/27/2021 in [place] [facility] [place] [facility]  PHQ-9 Total Score 0       Fall Risk 06/18/2019 09/08/2020 09/08/2021 09/12/2021 09/27/2021  Falls in the past year? 0 0 0 0 0  Was there an injury with Fall? 0 0 - 0 -  Fall Risk Category Calculator 0 0 - 0 -  Fall Risk Category Low Low - Low -  Patient Fall Risk Level - - - Low fall risk Low fall risk  Fall risk Follow up - - - Falls evaluation completed -     Impression  and Plan:  Encounter for preventive health examination  - Plan: PSA, Hemoglobin A1c, TSH, Vitamin B12, VITAMIN D 25 Hydroxy (Vit-D Deficiency, Fractures), VITAMIN D 25 Hydroxy (Vit-D Deficiency, Fractures), Vitamin B12, TSH, Hemoglobin A1c, PSA -Recommend routine eye and dental care. -Immunizations: Due for COVID booster, Tdap, shingles, he will obtain these vaccines at his pharmacy -Healthy lifestyle discussed in detail. -Labs to be updated today. -Colon cancer screening: 02/2017, 5-year callback -Breast cancer screening: Not applicable -Cervical cancer screening: Not applicable -Lung cancer screening: Not applicable -Prostate cancer screening: PSA today -DEXA: Not applicable  Anal fissure  - Plan: hydrocortisone (ANUSOL-HC) 2.5 % rectal cream  Abnormal TSH -Check TSH, probable subclinical hypothyroidism.  Hyperglycemia -Prior fasting labs with higher than expected glucose. Check fasting glucose and A1c.     Patient Instructions  -Nice seeing you today!!  -Lab work today; will notify you once results  are available.  -Remember your COVID booster, tetanus and shingles vaccines at the pharmacy.  -Apply anusol cream 3 times a day to your anal fissure  -Schedule follow up in 6 months.      Lelon Frohlich, MD [facility] at [facility]

## 2021-10-02 ENCOUNTER — Ambulatory Visit (INDEPENDENT_AMBULATORY_CARE_PROVIDER_SITE_OTHER)
Admission: RE | Admit: 2021-10-02 | Discharge: 2021-10-02 | Disposition: A | Discharge status: Home / Self Care | Payer: PPO | Source: Ambulatory Visit | Attending: Internal Medicine | Admitting: Internal Medicine

## 2021-10-02 ENCOUNTER — Other Ambulatory Visit: Payer: Self-pay

## 2021-10-02 DIAGNOSIS — J3489 Other specified disorders of nose and nasal sinuses: Secondary | ICD-10-CM | POA: Diagnosis not present

## 2021-10-02 DIAGNOSIS — Z8673 Personal history of transient ischemic attack (TIA), and cerebral infarction without residual deficits: Secondary | ICD-10-CM | POA: Diagnosis not present

## 2021-10-02 DIAGNOSIS — R202 Paresthesia of skin: Secondary | ICD-10-CM

## 2021-10-02 DIAGNOSIS — R200 Anesthesia of skin: Secondary | ICD-10-CM | POA: Diagnosis not present

## 2021-10-03 ENCOUNTER — Ambulatory Visit (HOSPITAL_COMMUNITY)
Admission: RE | Admit: 2021-10-03 | Discharge: 2021-10-03 | Disposition: A | Discharge status: Home / Self Care | Payer: PPO | Source: Ambulatory Visit | Attending: Internal Medicine | Admitting: Internal Medicine

## 2021-10-03 DIAGNOSIS — R200 Anesthesia of skin: Secondary | ICD-10-CM | POA: Insufficient documentation

## 2021-10-03 DIAGNOSIS — R202 Paresthesia of skin: Secondary | ICD-10-CM | POA: Diagnosis not present

## 2021-10-05 ENCOUNTER — Telehealth: Payer: Self-pay | Admitting: Internal Medicine

## 2021-10-05 ENCOUNTER — Encounter: Payer: Self-pay | Admitting: Internal Medicine

## 2021-10-05 NOTE — Telephone Encounter (Signed)
Patient called because he received phone call from our number about results. I let patient know that I didn't see any messages left and he stated he would disregard and if they needed him they would callback. Patient believes it may be about his CAROTID    Please advise

## 2021-10-05 NOTE — Telephone Encounter (Signed)
Pt has seen result note & has no questions & concerns.

## 2021-10-11 ENCOUNTER — Other Ambulatory Visit: Payer: Self-pay

## 2021-10-11 ENCOUNTER — Ambulatory Visit (HOSPITAL_COMMUNITY): Payer: PPO | Attending: Internal Medicine

## 2021-10-11 DIAGNOSIS — R200 Anesthesia of skin: Secondary | ICD-10-CM | POA: Insufficient documentation

## 2021-10-11 DIAGNOSIS — G459 Transient cerebral ischemic attack, unspecified: Secondary | ICD-10-CM | POA: Diagnosis not present

## 2021-10-11 DIAGNOSIS — R202 Paresthesia of skin: Secondary | ICD-10-CM | POA: Diagnosis not present

## 2021-10-11 DIAGNOSIS — I080 Rheumatic disorders of both mitral and aortic valves: Secondary | ICD-10-CM | POA: Insufficient documentation

## 2021-10-11 LAB — ECHOCARDIOGRAM COMPLETE
Area-P 1/2: 2.26 cm2
P 1/2 time: 841 msec
S' Lateral: 3.1 cm

## 2021-10-16 ENCOUNTER — Telehealth: Payer: Self-pay | Admitting: Internal Medicine

## 2021-10-16 NOTE — Telephone Encounter (Signed)
Patient returned call for lab results, CMA was unavailable so I let patient know he would get a callback when someone was available.       Please advise

## 2021-10-17 DIAGNOSIS — L439 Lichen planus, unspecified: Secondary | ICD-10-CM | POA: Diagnosis not present

## 2021-10-17 DIAGNOSIS — Z23 Encounter for immunization: Secondary | ICD-10-CM | POA: Diagnosis not present

## 2021-10-17 DIAGNOSIS — D485 Neoplasm of uncertain behavior of skin: Secondary | ICD-10-CM | POA: Diagnosis not present

## 2021-10-17 IMAGING — DX DG LUMBAR SPINE COMPLETE 4+V
5 series · 5 of 5 positions shown · non-contrast
Comparison: None.

CLINICAL DATA: Low back pain, left-sided radiculopathy

EXAM:
LUMBAR SPINE - COMPLETE 4+ VIEW

[lumbar spine ap]
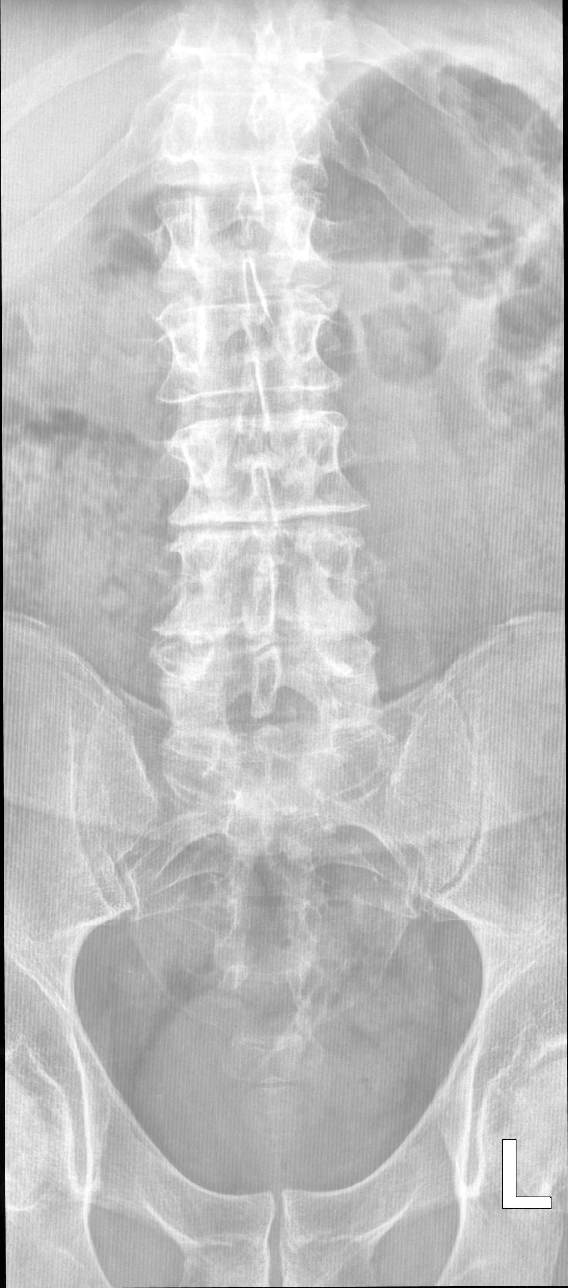

[lumbar spine oblique (1 of 2)]
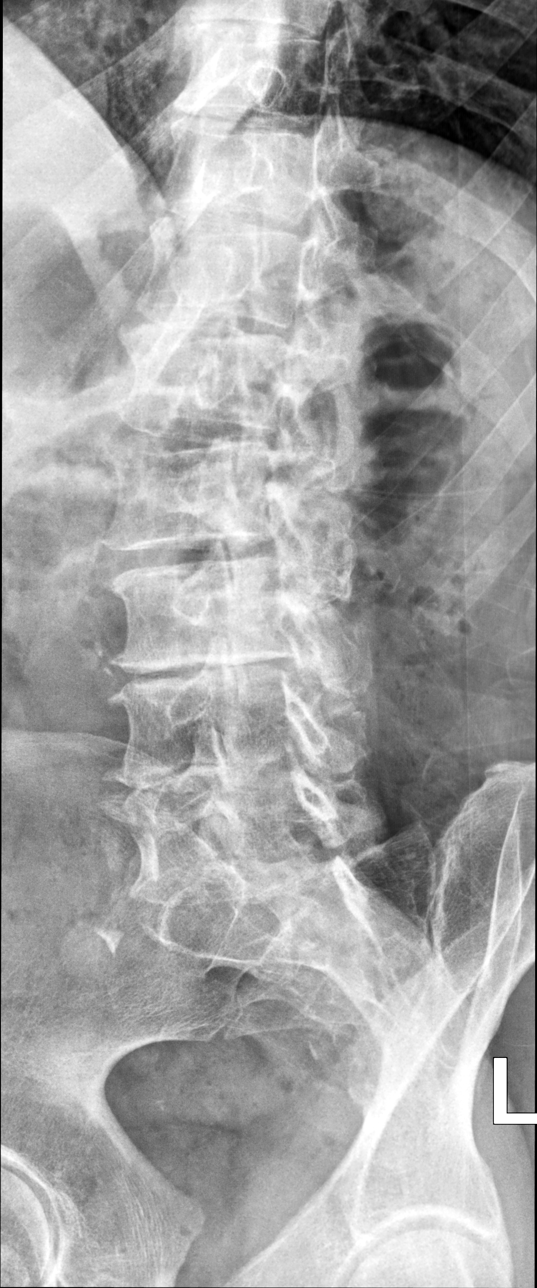

[lumbar spine oblique (2 of 2)]
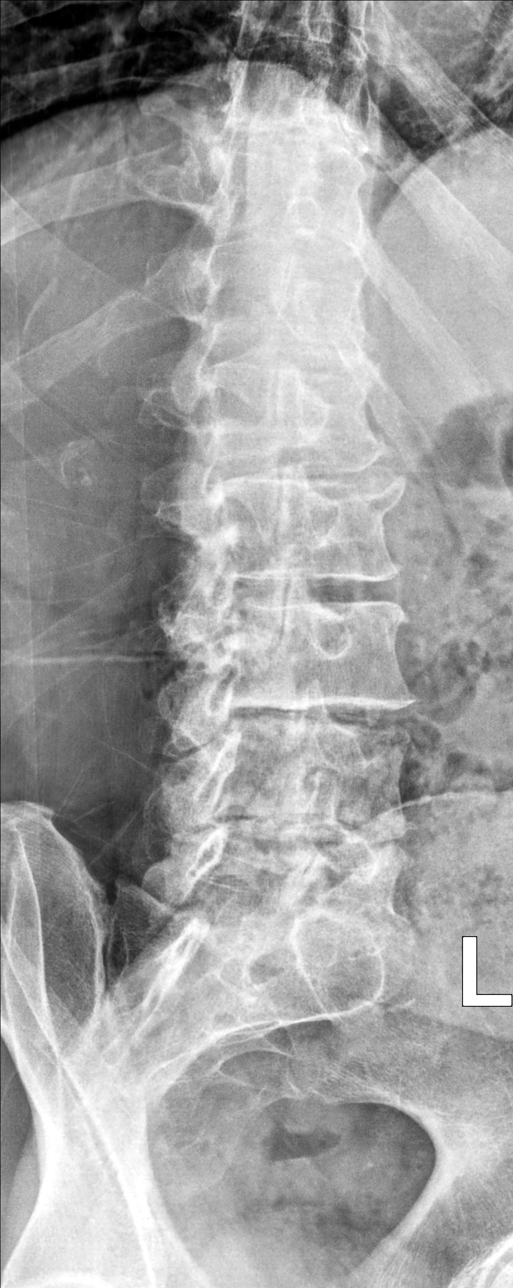

[lumbar spine lat]
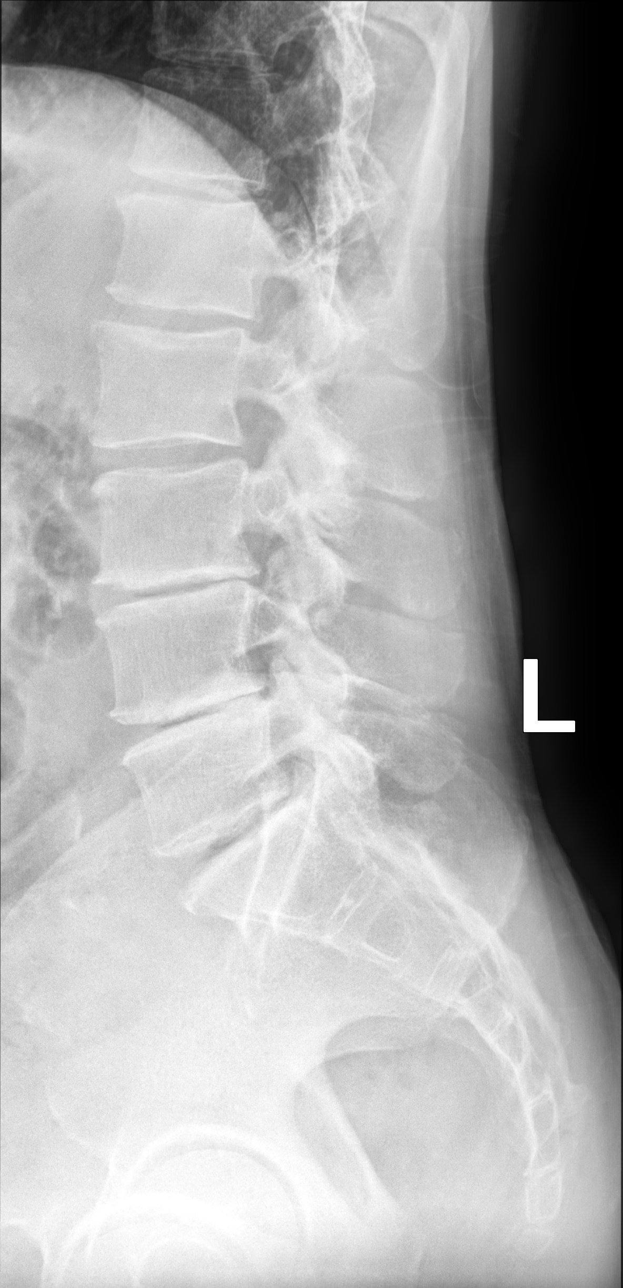

[lumbar spine lat spot]
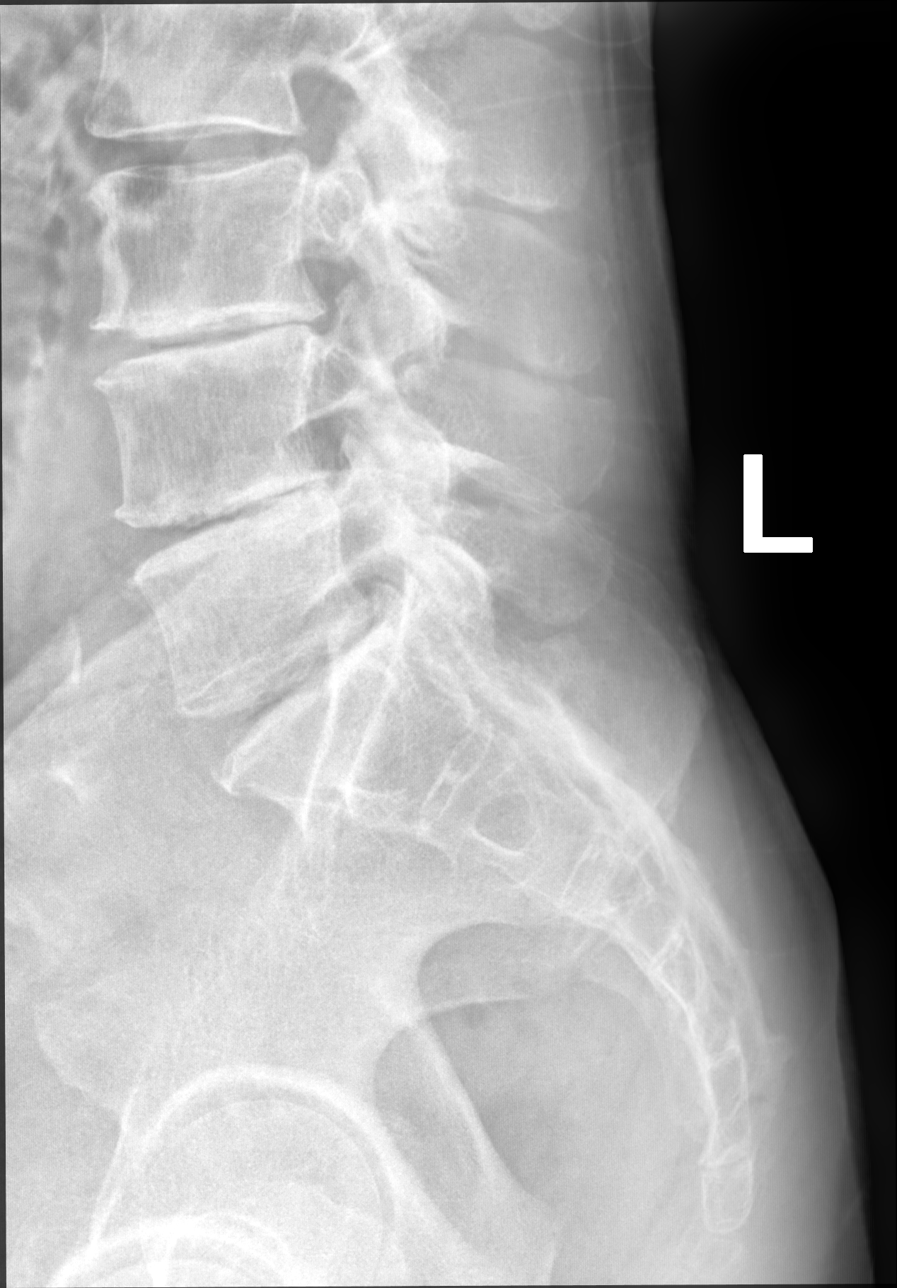

[5 of 5 positions shown; findings below may reference images not displayed]

FINDINGS: Five view radiograph lumbar spine. Five non rib bearing segments of
the lumbar spine. Normal lumbar lordosis. No acute fracture or
listhesis of the lumbar spine. There is intervertebral disc space
narrowing and endplate remodeling throughout the lumbar spine, most
severe at L3-S1 with vacuum disc phenomena noted at these levels, in
keeping with changes of moderate to severe degenerative disc
disease. Vertebral body height has been preserved. Oblique views
demonstrate no evidence of pars defect. The paraspinal soft tissues
are unremarkable.
IMPRESSION: Moderate to severe diffuse degenerative disc disease.

## 2021-10-17 NOTE — Telephone Encounter (Signed)
Edward Berger spoke with pt this morning.

## 2021-11-05 ENCOUNTER — Encounter: Payer: Self-pay | Admitting: Internal Medicine

## 2021-11-09 DIAGNOSIS — H00012 Hordeolum externum right lower eyelid: Secondary | ICD-10-CM | POA: Diagnosis not present

## 2021-11-09 DIAGNOSIS — H10411 Chronic giant papillary conjunctivitis, right eye: Secondary | ICD-10-CM | POA: Diagnosis not present

## 2021-11-20 DIAGNOSIS — Z23 Encounter for immunization: Secondary | ICD-10-CM | POA: Diagnosis not present

## 2021-11-20 DIAGNOSIS — L570 Actinic keratosis: Secondary | ICD-10-CM | POA: Diagnosis not present

## 2021-11-20 DIAGNOSIS — L309 Dermatitis, unspecified: Secondary | ICD-10-CM | POA: Diagnosis not present

## 2021-12-12 NOTE — Telephone Encounter (Signed)
Pt notified that adjustments have been made my Dr Jerilee Hoh & his insurance will be filed again; also informed that it may take up to 30 days for a determination after that & that adjustments still may not cover his bill. Pt verb understanding. ?

## 2022-01-07 DIAGNOSIS — L432 Lichenoid drug reaction: Secondary | ICD-10-CM | POA: Diagnosis not present

## 2022-01-17 ENCOUNTER — Encounter: Payer: Self-pay | Admitting: Internal Medicine

## 2022-01-17 DIAGNOSIS — Z1211 Encounter for screening for malignant neoplasm of colon: Secondary | ICD-10-CM

## 2022-02-04 ENCOUNTER — Encounter: Payer: Self-pay | Admitting: Internal Medicine

## 2022-02-06 ENCOUNTER — Ambulatory Visit (INDEPENDENT_AMBULATORY_CARE_PROVIDER_SITE_OTHER): Payer: PPO | Admitting: Internal Medicine

## 2022-02-06 ENCOUNTER — Encounter: Payer: Self-pay | Admitting: Internal Medicine

## 2022-02-06 VITALS — BP 110/68 | HR 54 | Temp 97.7°F | Ht 68.0 in | Wt 156.5 lb

## 2022-02-06 DIAGNOSIS — J0100 Acute maxillary sinusitis, unspecified: Secondary | ICD-10-CM

## 2022-02-06 NOTE — Progress Notes (Signed)
? ? ? ?Acute office Visit ? ? ? ? ?CC/Reason for Visit: Sinus pain ? ?HPI: Edward Berger is a 76 y.o. male who is coming in today for the above mentioned reasons.  For about 2 weeks he has been having left maxillary sinus pain.  He states when he blows his nose he will sometimes have some yellowish secretions but at times they are clear.  He has never suffered from seasonal allergies.  No fever. ? ?Past Medical/Surgical History: ?Past Medical History:  ?Diagnosis Date  ? Blood transfusion without reported diagnosis   ? years ago with surgery   ? Cervical radiculopathy, chronic   ? ? ?Past Surgical History:  ?Procedure Laterality Date  ? ANKLE SURGERY    ? right  ? COLONOSCOPY    ? ENT    ? nasal polyp removal   ? SHOULDER SURGERY    ? left  ? ? ?Social History: ? reports that he has never smoked. He has never used smokeless tobacco. He reports current alcohol use. He reports that he does not use drugs. ? ?Allergies: ?No Known Allergies ? ?Family History:  ?Family History  ?Problem Relation Age of Onset  ? Heart disease Mother   ? Cancer Father   ?     prostate  ? Colon cancer Neg Hx   ? Colon polyps Neg Hx   ? Esophageal cancer Neg Hx   ? Rectal cancer Neg Hx   ? Stomach cancer Neg Hx   ? ? ? ?Current Outpatient Medications:  ?  aspirin EC 81 MG tablet, Take 81 mg by mouth daily. Swallow whole., Disp: , Rfl:  ?  finasteride (PROPECIA) 1 MG tablet, TAKE ONE TABLET BY MOUTH ONE TIME DAILY, Disp: 90 tablet, Rfl: 1 ?  hydrocortisone (ANUSOL-HC) 2.5 % rectal cream, Place 1 application rectally 2 (two) times daily., Disp: 30 g, Rfl: 2 ?  ibuprofen (ADVIL,MOTRIN) 200 MG tablet, Take 400 mg by mouth every 6 (six) hours as needed for pain., Disp: , Rfl:  ?  Multiple Vitamin (MULTIVITAMIN) tablet, Take 1 tablet by mouth daily., Disp: , Rfl:  ?  Omega-3 Fatty Acids (FISH OIL) 1000 MG CAPS, Take 1 capsule by mouth daily., Disp: , Rfl:  ?  Naproxen Sodium (ALEVE PO), Take by mouth. As needed, Disp: , Rfl:  ? ?Review of  Systems:  ?Constitutional: Denies fever, chills, diaphoresis, appetite change and fatigue.  ?HEENT: Denies photophobia, eye pain, redness, hearing loss, ear pain,  sneezing, mouth sores, trouble swallowing, neck pain, neck stiffness and tinnitus.   ?Respiratory: Denies SOB, DOE, cough, chest tightness,  and wheezing.   ?Cardiovascular: Denies chest pain, palpitations and leg swelling.  ?Gastrointestinal: Denies nausea, vomiting, abdominal pain, diarrhea, constipation, blood in stool and abdominal distention.  ?Genitourinary: Denies dysuria, urgency, frequency, hematuria, flank pain and difficulty urinating.  ?Endocrine: Denies: hot or cold intolerance, sweats, changes in hair or nails, polyuria, polydipsia. ?Musculoskeletal: Denies myalgias, back pain, joint swelling, arthralgias and gait problem.  ?Skin: Denies pallor, rash and wound.  ?Neurological: Denies dizziness, seizures, syncope, weakness, light-headedness, numbness and headaches.  ?Hematological: Denies adenopathy. Easy bruising, personal or family bleeding history  ?Psychiatric/Behavioral: Denies suicidal ideation, mood changes, confusion, nervousness, sleep disturbance and agitation ? ? ? ?Physical Exam: ?Vitals:  ? 02/06/22 1004  ?BP: 110/68  ?Pulse: (!) 54  ?Temp: 97.7 ?F (36.5 ?C)  ?TempSrc: Oral  ?Weight: 156 lb 8 oz (71 kg)  ?Height: '5\' 8"'$  (1.727 m)  ? ? ?Body mass index is  23.8 kg/m?. ? ? ?Constitutional: NAD, calm, comfortable ?Eyes: PERRL, lids and conjunctivae normal, wears corrective lenses ?ENMT: Mucous membranes are moist.  ?Respiratory: clear to auscultation bilaterally, no wheezing, no crackles. Normal respiratory effort. No accessory muscle use.  ?Cardiovascular: Regular rate and rhythm, no murmurs / rubs / gallops. No extremity edema. ?Psychiatric: Normal judgment and insight. Alert and oriented x 3. Normal mood.  ? ? ?Impression and Plan: ? ?Acute maxillary sinusitis, recurrence not specified ? ?-Have advised that he try taking an  antihistamine combined with guaifenesin and a decongestant for about 7 to 10 days.  If not better he knows to contact the office for further steps. ? ? ?Time spent:21 minutes reviewing chart, interviewing and examining patient and formulating plan of care. ? ? ? ? ? ?Lelon Frohlich, MD ?[facility] at [facility] ? ? ?

## 2022-02-14 ENCOUNTER — Encounter: Payer: Self-pay | Admitting: Internal Medicine

## 2022-02-21 ENCOUNTER — Telehealth: Payer: Self-pay | Admitting: Gastroenterology

## 2022-02-21 NOTE — Telephone Encounter (Signed)
We have not yet done June recalls. Looking at his procedure, based on updated guidelines, he would not be due for his next exam until 02/2024 (one small adenoma removed in 02/2017). In that light, he does not need a procedure now. He can see me in the office to determine if he wishes to have another colonoscopy in 2025. Can you please let him know. Thanks

## 2022-02-21 NOTE — Telephone Encounter (Signed)
Good Morning Dr. Havery Moros,   Patient called wanting to schedule his 5 year recall colposcopy. Looking at his chart I do not see a letter nor a recall assessment sheet. Patient is 42. Will you advise on scheduling patient please?  Thank you.Marland Kitchen

## 2022-02-22 NOTE — Telephone Encounter (Signed)
Call patient and left voicemail with Dr. Ozella Rocks recommendations.

## 2022-03-12 DIAGNOSIS — M5412 Radiculopathy, cervical region: Secondary | ICD-10-CM | POA: Diagnosis not present

## 2022-03-15 ENCOUNTER — Other Ambulatory Visit: Payer: Self-pay | Admitting: Internal Medicine

## 2022-03-27 ENCOUNTER — Encounter: Payer: Self-pay | Admitting: Internal Medicine

## 2022-04-01 ENCOUNTER — Encounter: Payer: Self-pay | Admitting: Internal Medicine

## 2022-04-01 ENCOUNTER — Ambulatory Visit (INDEPENDENT_AMBULATORY_CARE_PROVIDER_SITE_OTHER): Payer: PPO | Admitting: Internal Medicine

## 2022-04-01 VITALS — Temp 97.8°F | Wt 157.9 lb

## 2022-04-01 DIAGNOSIS — H8110 Benign paroxysmal vertigo, unspecified ear: Secondary | ICD-10-CM | POA: Diagnosis not present

## 2022-04-01 MED ORDER — FINASTERIDE 1 MG PO TABS: 1.0000 mg | ORAL_TABLET | Freq: Every day | ORAL | 1 refills | Status: DC

## 2022-04-01 NOTE — Progress Notes (Signed)
Established Patient Office Visit     CC/Reason for Visit: Vertigo  HPI: Edward Berger is a 76 y.o. male who is coming in today for the above mentioned reasons.  He was on a trip to the [place] recently.  On the last day of his trip, which was about 10 days ago, while getting out of bed early in the morning he experienced a severe episode of vertigo with nausea and vomiting.  They had to call the ship doctor.  He got better over the course of the day, made it back stateside without incident.  He has had about 2 more episodes since.  He had a CT scan of the head back in January for a TIA that was negative.  He has been doing Epley maneuvers at home which has resolved each of these episodes.  Past Medical/Surgical History: Past Medical History:  Diagnosis Date   Blood transfusion without reported diagnosis    years ago with surgery    Cervical radiculopathy, chronic     Past Surgical History:  Procedure Laterality Date   ANKLE SURGERY     right   COLONOSCOPY     ENT     nasal polyp removal    SHOULDER SURGERY     left    Social History:  reports that he has never smoked. He has never used smokeless tobacco. He reports current alcohol use. He reports that he does not use drugs.  Allergies: No Known Allergies  Family History:  Family History  Problem Relation Age of Onset   Heart disease Mother    Cancer Father        prostate   Colon cancer Neg Hx    Colon polyps Neg Hx    Esophageal cancer Neg Hx    Rectal cancer Neg Hx    Stomach cancer Neg Hx      Current Outpatient Medications:    aspirin EC 81 MG tablet, Take 81 mg by mouth daily. Swallow whole., Disp: , Rfl:    hydrocortisone (ANUSOL-HC) 2.5 % rectal cream, Place 1 application rectally 2 (two) times daily., Disp: 30 g, Rfl: 2   ibuprofen (ADVIL,MOTRIN) 200 MG tablet, Take 400 mg by mouth every 6 (six) hours as needed for pain., Disp: , Rfl:    Multiple Vitamin (MULTIVITAMIN) tablet, Take 1 tablet  by mouth daily., Disp: , Rfl:    Omega-3 Fatty Acids (FISH OIL) 1000 MG CAPS, Take 1 capsule by mouth daily., Disp: , Rfl:    finasteride (PROPECIA) 1 MG tablet, Take 1 tablet (1 mg total) by mouth daily., Disp: 90 tablet, Rfl: 1  Review of Systems:  Constitutional: Denies fever, chills, diaphoresis, appetite change and fatigue.  HEENT: Denies photophobia, eye pain, redness, hearing loss, ear pain, congestion, sore throat, rhinorrhea, sneezing, mouth sores, trouble swallowing, neck pain, neck stiffness and tinnitus.   Respiratory: Denies SOB, DOE, cough, chest tightness,  and wheezing.   Cardiovascular: Denies chest pain, palpitations and leg swelling.  Gastrointestinal: Denies nausea, vomiting, abdominal pain, diarrhea, constipation, blood in stool and abdominal distention.  Genitourinary: Denies dysuria, urgency, frequency, hematuria, flank pain and difficulty urinating.  Endocrine: Denies: hot or cold intolerance, sweats, changes in hair or nails, polyuria, polydipsia. Musculoskeletal: Denies myalgias, back pain, joint swelling, arthralgias and gait problem.  Skin: Denies pallor, rash and wound.  Neurological: Denies dizziness, seizures, syncope, weakness, light-headedness, numbness and headaches.  Hematological: Denies adenopathy. Easy bruising, personal or family bleeding history  Psychiatric/Behavioral: Denies suicidal  ideation, mood changes, confusion, nervousness, sleep disturbance and agitation    Physical Exam: Vitals:   04/01/22 1128  Temp: 97.8 F (36.6 C)  TempSrc: Oral  SpO2: 99%  Weight: 157 lb 14.4 oz (71.6 kg)    Body mass index is 24.01 kg/m.   Constitutional: NAD, calm, comfortable Eyes: PERRL, lids and conjunctivae normal, wears corrective lenses ENMT: Mucous membranes are moist.   Psychiatric: Normal judgment and insight. Alert and oriented x 3. Normal mood.    Impression and Plan:  Benign paroxysmal positional vertigo, unspecified laterality  - Plan:  Ambulatory Referral to Neuro Rehab -This definitely sounds like benign vertigo, I will initiate referral to vestibular therapy, we have reviewed Epley maneuver together.    Time spent:21 minutes reviewing chart, interviewing and examining patient and formulating plan of care.     Lelon Frohlich, MD [facility] at [facility]

## 2022-04-04 DIAGNOSIS — R202 Paresthesia of skin: Secondary | ICD-10-CM | POA: Diagnosis not present

## 2022-04-05 ENCOUNTER — Ambulatory Visit: Payer: PPO | Attending: Internal Medicine | Admitting: Rehabilitative and Restorative Service Providers"

## 2022-04-05 ENCOUNTER — Other Ambulatory Visit: Payer: Self-pay

## 2022-04-05 ENCOUNTER — Encounter: Payer: Self-pay | Admitting: Rehabilitative and Restorative Service Providers"

## 2022-04-05 DIAGNOSIS — R2681 Unsteadiness on feet: Secondary | ICD-10-CM | POA: Insufficient documentation

## 2022-04-05 DIAGNOSIS — R42 Dizziness and giddiness: Secondary | ICD-10-CM | POA: Insufficient documentation

## 2022-04-05 NOTE — Therapy (Signed)
OUTPATIENT PHYSICAL THERAPY VESTIBULAR EVALUATION     Patient Name: Edward Berger MRN: [id number] DOB:1946-05-08, 76 y.o., male Today's Date: 04/05/2022  PCP: Isaac Bliss, Rayford Halsted, MD  REFERRING PROVIDER: Isaac Bliss, Rayford Halsted, MD    PT End of Session - 04/05/22 1108     Visit Number 1    Date for PT Re-Evaluation 05/31/22    Authorization Type [employer]    Progress Note Due on Visit 10    PT Start Time 1100    PT Stop Time 1140    PT Time Calculation (min) 40 min    Activity Tolerance Patient tolerated treatment well    Behavior During Therapy WFL for tasks assessed/performed             Past Medical History:  Diagnosis Date   Blood transfusion without reported diagnosis    years ago with surgery    Cervical radiculopathy, chronic    Past Surgical History:  Procedure Laterality Date   ANKLE SURGERY     right   COLONOSCOPY     ENT     nasal polyp removal    SHOULDER SURGERY     left   Patient Active Problem List   Diagnosis Date Noted   Achilles tendon disorder, 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  On the last full day of the cruise (03/22/22), he began having extreme dizziness and went to see the ship doctor for treatment.  He initially had some relief, but he has been having intermittent dizziness since is return back, but to a less severe degree.  Pt reports that he still occasionally has dizziness in the morning when he goes from lying down to sitting on the side of the bed.  Pt denies dizziness with rolling over in bed. Pt accompanied by:  self  PERTINENT HISTORY: Hx of back pain   PAIN:  Are you having pain? Yes: NPRS scale: 1-2/10 Pain location: back Pain description: intermittent radiating down left leg Aggravating factors: unknown Relieving factors: stretches  PRECAUTIONS: None  WEIGHT BEARING RESTRICTIONS No  FALLS: Has patient fallen in last 6 months? No  LIVING ENVIRONMENT: Lives with: lives with their spouse Lives in: House/apartment Stairs: Yes: Internal: 15 steps; on right going up and External: 4 steps; on left going up Has following equipment at home: None  PLOF: Independent and Leisure: reading, exercising, travelng  PATIENT GOALS:  To not be dizzy.  OBJECTIVE:   DIAGNOSTIC FINDINGS: n/a  COGNITION: Overall cognitive status: Within functional limits for tasks assessed   SENSATION: Some tingling in right hand  POSTURE: rounded shoulders   Cervical ROM:  WFL  STRENGTH: WFL   FUNCTIONAL TESTs:  5 times sit to stand: 11.4 sec Timed up and go (TUG): 7.99 sec without assisted device  PATIENT SURVEYS:  DHI Total Score: 12 / 100 Physical Score: 4 / 28 Emotional Score: 4 / 36 Functional Score: 4 / 36  VESTIBULAR ASSESSMENT   GENERAL OBSERVATION: Pt appears in good health with positive attitude reporting that he has been performing [employer] exercises.    SYMPTOM BEHAVIOR:   Subjective history: Pt  started having dizziness when he was on a cruise in [place], the severity of symptoms has improved since initial onset while on his cruise.   Non-Vestibular symptoms: nausea/vomiting   Type of dizziness: Spinning/Vertigo   Frequency: typically in the mornings now   Duration: reports brief duration   Aggravating factors: Induced by position change: rolling to the left and supine to sit   Relieving factors: head stationary   Progression of symptoms: better   OCULOMOTOR EXAM:   Ocular Alignment: normal   Ocular ROM: No Limitations   Spontaneous Nystagmus: absent   Gaze-Induced  Nystagmus: absent   Smooth Pursuits: intact   Saccades: intact   Head thrust positive for dizziness when head turned to left      POSITIONAL TESTING: Right Dix-Hallpike: no nystagmus Left Dix-Hallpike: nystagmus noted    VESTIBULAR TREATMENT:  04/05/2022: Canalith Repositioning:   Epley Left: Number of Reps: 2 and Response to Treatment: symptoms improved VOR 1x1 in sitting for vertical and horizontal head shakes x1 min each Pencil Pushups x10  PATIENT EDUCATION: Education details: Issued Chemical engineer Person educated: Patient Education method: Explanation, Demonstration, and Handouts Education comprehension: verbalized understanding and returned demonstration  HOME EXERCISE PROGRAM: Access Code: [email or web address] URL: [email or web address] Date: 04/05/2022 Prepared by: Juel Burrow  Exercises - Brandt-Daroff Vestibular Exercise  - 1-2 x daily - 7 x weekly - 1 sets - 5 reps - Seated Gaze Stabilization with Head Rotation  - 1 x daily - 7 x weekly - 3 sets - 50 reps - Seated Gaze Stabilization with Head Nod  - 1 x daily - 7 x weekly - 3 sets - 50 reps - Pencil Pushups  - 1 x daily - 7 x weekly - 2 sets - 10 reps  GOALS: Goals reviewed with patient? Yes  SHORT TERM GOALS: Target date: 04/26/2022   Pt will be independent with initial HEP. Baseline: Goal status: INITIAL  2.  Pt will report at least a 40% improvement in symptoms since starting PT. Baseline:  Goal status: INITIAL   LONG TERM GOALS: Target date: 05/31/2022    Pt will be independent with advanced HEP. Baseline:  Goal status: INITIAL  2.  Pt will have a negative Dix Hallpike assessment to indicate improved status. Baseline: positive on left Goal status: INITIAL  3.  Dizziness Handicapped Inventory to decrease to no greater than 5/100 to indicate decreased dizziness. Baseline:  Goal status: INITIAL   ASSESSMENT:  CLINICAL IMPRESSION: Patient is a 76 y.o. male who was seen today for physical  therapy evaluation and treatment for dizziness and BPPV. Pt's initial onset of symptoms began on 03/22/22 while on a cruise in [place].  Pts symptoms since that time have improved and he has been performing the Nestor Lewandowsky since the onset of symptoms.  Pts PLOF is independent and able to perform activities without dizziness and he is reporting that he has been performing his HEP for his back that he was given during a previous therapy certification.  Pt would benefit from skilled PT to address his functional impairments and provide vestibular rehab.   OBJECTIVE IMPAIRMENTS decreased balance and dizziness.   ACTIVITY LIMITATIONS bed mobility  PARTICIPATION LIMITATIONS: community activity  PERSONAL FACTORS 1 comorbidity: low back pain  are also affecting patient's functional outcome.   REHAB POTENTIAL: Good  CLINICAL DECISION MAKING: Evolving/moderate complexity  EVALUATION COMPLEXITY: Moderate   PLAN: PT FREQUENCY: 2x/week  PT DURATION: 8 weeks  PLANNED INTERVENTIONS: Therapeutic exercises, Therapeutic activity, Neuromuscular re-education, Balance training, Gait  training, Patient/Family education, Joint mobilization, Vestibular training, Canalith repositioning, Dry Needling, Manual therapy, and Re-evaluation  PLAN FOR NEXT SESSION: Marye Round and canalith repositioning as needed, Vestibular rehab   [place], PT 04/05/2022, 11:09 AM   [facility] [address] [place] [address] [place], [place] [address] Phone # [phone] Fax [phone]

## 2022-04-08 ENCOUNTER — Encounter: Payer: Self-pay | Admitting: Rehabilitative and Restorative Service Providers"

## 2022-04-08 ENCOUNTER — Ambulatory Visit: Payer: PPO | Admitting: Rehabilitative and Restorative Service Providers"

## 2022-04-08 DIAGNOSIS — R42 Dizziness and giddiness: Secondary | ICD-10-CM | POA: Diagnosis not present

## 2022-04-08 DIAGNOSIS — R2681 Unsteadiness on feet: Secondary | ICD-10-CM

## 2022-04-08 NOTE — Therapy (Signed)
OUTPATIENT PHYSICAL THERAPY VESTIBULAR TREATMENT NOTE     Patient Name: Edward Berger MRN: [id number] DOB:August 26, 1946, 76 y.o., male Today's Date: 04/08/2022  PCP: Isaac Bliss, Rayford Halsted, MD  REFERRING PROVIDER: Isaac Bliss, Rayford Halsted, MD    PT End of Session - 04/08/22 1147     Visit Number 2    Date for PT Re-Evaluation 05/31/22    Authorization Type [employer]    Progress Note Due on Visit 10    PT Start Time 1145    PT Stop Time 1225    PT Time Calculation (min) 40 min    Activity Tolerance Patient tolerated treatment well    Behavior During Therapy [facility] for tasks assessed/performed             Past Medical History:  Diagnosis Date   Blood transfusion without reported diagnosis    years ago with surgery    Cervical radiculopathy, chronic    Past Surgical History:  Procedure Laterality Date   ANKLE SURGERY     right   COLONOSCOPY     ENT     nasal polyp removal    SHOULDER SURGERY     left   Patient Active Problem List   Diagnosis Date Noted   Achilles tendon disorder, 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  Pt reports that on Sunday he woke up and has been feeling light headed and off balance since that time.  PERTINENT HISTORY: Hx of back pain   PAIN:  Are you having pain? Yes: NPRS scale: 0/10 Pain location: back Pain description: intermittent radiating down left leg Aggravating factors: unknown Relieving factors: stretches  PRECAUTIONS: None  WEIGHT BEARING RESTRICTIONS No  FALLS: Has patient fallen in last 6 months? No  LIVING  ENVIRONMENT: Lives with: lives with their spouse Lives in: House/apartment Stairs: Yes: Internal: 15 steps; on right going up and External: 4 steps; on left going up Has following equipment at home: None  PLOF: Independent and Leisure: reading, exercising, travelng  PATIENT GOALS:  To not be dizzy.  OBJECTIVE:   DIAGNOSTIC FINDINGS: n/a  COGNITION: Overall cognitive status: Within functional limits for tasks assessed   SENSATION: Some tingling in right hand  POSTURE: rounded shoulders   Cervical ROM:  WFL  STRENGTH: WFL   FUNCTIONAL TESTs:  5 times sit to stand: 11.4 sec Timed up and go (TUG): 7.99 sec without assisted device  PATIENT SURVEYS:  DHI Total Score: 12 / 100 Physical Score: 4 / 28 Emotional Score: 4 / 36 Functional Score: 4 / 36  VESTIBULAR ASSESSMENT   GENERAL OBSERVATION: Pt appears in good health with positive attitude reporting that he has been performing [employer] exercises.    SYMPTOM BEHAVIOR:   Subjective history: Pt started having dizziness when he was on a cruise in [place], the severity of symptoms has improved since initial onset while on his cruise.   Non-Vestibular symptoms: nausea/vomiting   Type of dizziness: Spinning/Vertigo   Frequency: typically in the mornings now   Duration: reports brief duration   Aggravating factors: Induced by position change: rolling to the left and supine to sit   Relieving  factors: head stationary   Progression of symptoms: better   OCULOMOTOR EXAM:   Ocular Alignment: normal   Ocular ROM: No Limitations   Spontaneous Nystagmus: absent   Gaze-Induced Nystagmus: absent   Smooth Pursuits: intact   Saccades: intact   Head thrust positive for dizziness when head turned to left      POSITIONAL TESTING: Right Dix-Hallpike: no nystagmus Left Dix-Hallpike: nystagmus noted    VESTIBULAR TREATMENT:  04/08/2022: Marye Round positive on bilateral sides Epley Maneuver x3 on left and x2 on right  side VOR 1x1 x1 minute Ambulation x10 ft with vertical and horizontal head turns  04/05/2022: Canalith Repositioning:   Epley Left: Number of Reps: 2 and Response to Treatment: symptoms improved VOR 1x1 in sitting for vertical and horizontal head shakes x1 min each Pencil Pushups x10  PATIENT EDUCATION: Education details: Issued Chemical engineer Person educated: Patient Education method: Explanation, Demonstration, and Handouts Education comprehension: verbalized understanding and returned demonstration  HOME EXERCISE PROGRAM: Access Code: [email or web address] URL: [email or web address] Date: 04/05/2022 Prepared by: Juel Burrow  Exercises - Brandt-Daroff Vestibular Exercise  - 1-2 x daily - 7 x weekly - 1 sets - 5 reps - Seated Gaze Stabilization with Head Rotation  - 1 x daily - 7 x weekly - 3 sets - 50 reps - Seated Gaze Stabilization with Head Nod  - 1 x daily - 7 x weekly - 3 sets - 50 reps - Pencil Pushups  - 1 x daily - 7 x weekly - 2 sets - 10 reps  GOALS: Goals reviewed with patient? Yes  SHORT TERM GOALS: Target date: 04/26/2022   Pt will be independent with initial HEP. Baseline: Goal status: IN PROGRESS  2.  Pt will report at least a 40% improvement in symptoms since starting PT. Baseline:  Goal status: INITIAL   LONG TERM GOALS: Target date: 05/31/2022    Pt will be independent with advanced HEP. Baseline:  Goal status: INITIAL  2.  Pt will have a negative Dix Hallpike assessment to indicate improved status. Baseline: positive on left Goal status: INITIAL  3.  Dizziness Handicapped Inventory to decrease to no greater than 5/100 to indicate decreased dizziness. Baseline:  Goal status: INITIAL   ASSESSMENT:  CLINICAL IMPRESSION: Edward Berger presents to PT stating that he is having some lightheadedness that started yesterday and has not improved.  Pt continues with positive L Marye Round and progressed to treatment, then tested R side and had a  positive test, so performed Epley Maneuver x2.  Pt provided with new HEP of ambulation with head turns, as he had increased difficulty with this compared to VOR 1x1.  Pt continues to require skilled PT to progress towards goal related activities.   OBJECTIVE IMPAIRMENTS decreased balance and dizziness.   ACTIVITY LIMITATIONS bed mobility  PARTICIPATION LIMITATIONS: community activity  PERSONAL FACTORS 1 comorbidity: low back pain  are also affecting patient's functional outcome.   REHAB POTENTIAL: Good  CLINICAL DECISION MAKING: Evolving/moderate complexity  EVALUATION COMPLEXITY: Moderate   PLAN: PT FREQUENCY: 2x/week  PT DURATION: 8 weeks  PLANNED INTERVENTIONS: Therapeutic exercises, Therapeutic activity, Neuromuscular re-education, Balance training, Gait training, Patient/Family education, Joint mobilization, Vestibular training, Canalith repositioning, Dry Needling, Manual therapy, and Re-evaluation  PLAN FOR NEXT SESSION: Marye Round and canalith repositioning as needed, Vestibular rehab   [place], PT 04/08/2022, 1:34 PM   [facility] [address] [place] [address] [place], [place] [address] Phone # [phone] Fax [phone]

## 2022-04-15 DIAGNOSIS — R202 Paresthesia of skin: Secondary | ICD-10-CM | POA: Diagnosis not present

## 2022-04-17 ENCOUNTER — Ambulatory Visit: Payer: PPO | Admitting: Rehabilitative and Restorative Service Providers"

## 2022-04-17 ENCOUNTER — Encounter: Payer: Self-pay | Admitting: Rehabilitative and Restorative Service Providers"

## 2022-04-17 DIAGNOSIS — R2681 Unsteadiness on feet: Secondary | ICD-10-CM

## 2022-04-17 DIAGNOSIS — R42 Dizziness and giddiness: Secondary | ICD-10-CM | POA: Diagnosis not present

## 2022-04-17 NOTE — Therapy (Addendum)
OUTPATIENT PHYSICAL THERAPY VESTIBULAR TREATMENT NOTE AND LATE ENTRY DISCHARGE NOTE     Patient Name: Edward Berger MRN: [id number] DOB:[birth year], 76 y.o., male Today's Date: 04/17/2022  PCP: Isaac Bliss, Rayford Halsted, MD  REFERRING PROVIDER: Isaac Bliss, Rayford Halsted, MD    PT End of Session - 04/17/22 1149     Visit Number 3    Date for PT Re-Evaluation 05/31/22    Authorization Type [employer]    Progress Note Due on Visit 10    PT Start Time 1145    PT Stop Time 1215    PT Time Calculation (min) 30 min    Activity Tolerance Patient tolerated treatment well    Behavior During Therapy WFL for tasks assessed/performed             Past Medical History:  Diagnosis Date   Blood transfusion without reported diagnosis    years ago with surgery    Cervical radiculopathy, chronic    Past Surgical History:  Procedure Laterality Date   ANKLE SURGERY     right   COLONOSCOPY     ENT     nasal polyp removal    SHOULDER SURGERY     left   Patient Active Problem List   Diagnosis Date Noted   Achilles tendon disorder, 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  Has dizziness less than once daily now.  PERTINENT HISTORY: Hx of back pain   PAIN:  Are you having pain? Yes: NPRS scale: 0/10 Pain location: back Pain description: intermittent radiating down left leg Aggravating factors: unknown Relieving factors: stretches  PRECAUTIONS: None  WEIGHT BEARING RESTRICTIONS No  FALLS: Has patient fallen in last 6 months? No  LIVING ENVIRONMENT: Lives  with: lives with their spouse Lives in: House/apartment Stairs: Yes: Internal: 15 steps; on right going up and External: 4 steps; on left going up Has following equipment at home: None  PLOF: Independent and Leisure: reading, exercising, travelng  PATIENT GOALS:  To not be dizzy.  OBJECTIVE:   DIAGNOSTIC FINDINGS: n/a  COGNITION: Overall cognitive status: Within functional limits for tasks assessed   SENSATION: Some tingling in right hand  POSTURE: rounded shoulders   Cervical ROM:  WFL  STRENGTH: WFL   FUNCTIONAL TESTs:  5 times sit to stand: 11.4 sec Timed up and go (TUG): 7.99 sec without assisted device  PATIENT SURVEYS:  04/17/2022: DHI Total Score: 2 / 100 Physical Score: 0 / 28 Emotional Score: 2 / 36 Functional Score: 0 / 36   Eval: DHI Total Score: 12 / 100 Physical Score: 4 / 28 Emotional Score: 4 / 36 Functional Score: 4 / 36  VESTIBULAR ASSESSMENT   GENERAL OBSERVATION: Pt appears in good health with positive attitude reporting that he has been performing [employer] exercises.    SYMPTOM BEHAVIOR:   Subjective history: Pt started having dizziness when he was on a cruise in [place], the severity of symptoms has improved since initial onset while on his cruise.   Non-Vestibular symptoms: nausea/vomiting   Type of dizziness: Spinning/Vertigo   Frequency: typically in the mornings now   Duration: reports brief  duration   Aggravating factors: Induced by position change: rolling to the left and supine to sit   Relieving factors: head stationary   Progression of symptoms: better   OCULOMOTOR EXAM:   Ocular Alignment: normal   Ocular ROM: No Limitations   Spontaneous Nystagmus: absent   Gaze-Induced Nystagmus: absent   Smooth Pursuits: intact   Saccades: intact   Head thrust positive for dizziness when head turned to left      POSITIONAL TESTING: Right Dix-Hallpike: no nystagmus Left Dix-Hallpike: nystagmus noted    VESTIBULAR  TREATMENT:  04/17/2022: Marye Round positive on left, negative on right Epley Maneuver x2 on left side Side stepping performing ball toss to wall rebound 3x10 ft Rebounder trampoline with red ball and single leg stance x 1 min each throw Tandem gait over [employer]Ex foam beam x4 laps  04/08/2022: Marye Round positive on bilateral sides Epley Maneuver x3 on left and x2 on right side VOR 1x1 x1 minute Ambulation x10 ft with vertical and horizontal head turns  04/05/2022: Canalith Repositioning:   Epley Left: Number of Reps: 2 and Response to Treatment: symptoms improved VOR 1x1 in sitting for vertical and horizontal head shakes x1 min each Pencil Pushups x10  PATIENT EDUCATION: Education details: Issued Chemical engineer Person educated: Patient Education method: Explanation, Demonstration, and Handouts Education comprehension: verbalized understanding and returned demonstration  HOME EXERCISE PROGRAM: Access Code: [email or web address] URL: [email or web address] Date: 04/05/2022 Prepared by: Juel Burrow  Exercises - Brandt-Daroff Vestibular Exercise  - 1-2 x daily - 7 x weekly - 1 sets - 5 reps - Seated Gaze Stabilization with Head Rotation  - 1 x daily - 7 x weekly - 3 sets - 50 reps - Seated Gaze Stabilization with Head Nod  - 1 x daily - 7 x weekly - 3 sets - 50 reps - Pencil Pushups  - 1 x daily - 7 x weekly - 2 sets - 10 reps  GOALS: Goals reviewed with patient? Yes  SHORT TERM GOALS: Target date: 04/26/2022   Pt will be independent with initial HEP. Baseline: Goal status: MET  2.  Pt will report at least a 40% improvement in symptoms since starting PT. Baseline:  Goal status: MET   LONG TERM GOALS: Target date: 05/31/2022    Pt will be independent with advanced HEP. Baseline:  Goal status: IN PROGRESS  2.  Pt will have a negative Dix Hallpike assessment to indicate improved status. Baseline: positive on left Goal status: IN PROGRESS  3.  Dizziness Handicapped  Inventory to decrease to no greater than 5/100 to indicate decreased dizziness. Baseline:  Goal status: MET on 04/17/22   ASSESSMENT:  CLINICAL IMPRESSION: Mr Heard presents to PT stating that he is feeling better since last visit and has made 98% improvement.  Pt able to perform balance exercises without difficulty.  Pt with minimal reported instability during head turns with ambulation, but pt able to stay on straight path walking.  Pt has improved on his Dizziness Handicapped Inventory.  During [facility], initial symptoms only lasted 15 seconds first trial, then 5-7 seconds on second trial.  Pt continues to require skilled PT to progress towards goal related activities and decreased dizziness.   OBJECTIVE IMPAIRMENTS decreased balance and dizziness.   ACTIVITY LIMITATIONS bed mobility  PARTICIPATION LIMITATIONS: community activity  PERSONAL FACTORS 1 comorbidity: low back pain  are also affecting patient's functional outcome.   REHAB POTENTIAL: Good  CLINICAL DECISION MAKING: Evolving/moderate complexity  EVALUATION COMPLEXITY: Moderate   PLAN:  PT FREQUENCY: 2x/week  PT DURATION: 8 weeks  PLANNED INTERVENTIONS: Therapeutic exercises, Therapeutic activity, Neuromuscular re-education, Balance training, Gait training, Patient/Family education, Joint mobilization, Vestibular training, Canalith repositioning, Dry Needling, Manual therapy, and Re-evaluation  PLAN FOR NEXT SESSION: Marye Round and canalith repositioning as needed, Vestibular rehab   PHYSICAL THERAPY DISCHARGE SUMMARY  As of 05/31/2022 pt has not returned for any follow up visits and discharged at this time. Patient agrees to discharge. Patient goals were partially met. Patient is being discharged due to not returning since the last visit.    Juel Burrow, PT 04/17/2022, 12:20 PM   [facility] [address] [place], [place] [address] Phone # [phone] Fax  [phone]

## 2022-04-22 ENCOUNTER — Encounter: Payer: Self-pay | Admitting: Gastroenterology

## 2022-04-23 ENCOUNTER — Encounter: Payer: PPO | Admitting: Rehabilitative and Restorative Service Providers"

## 2022-04-30 ENCOUNTER — Ambulatory Visit: Payer: PPO | Admitting: Rehabilitative and Restorative Service Providers"

## 2022-05-02 DIAGNOSIS — M4313 Spondylolisthesis, cervicothoracic region: Secondary | ICD-10-CM | POA: Diagnosis not present

## 2022-05-02 DIAGNOSIS — M5412 Radiculopathy, cervical region: Secondary | ICD-10-CM | POA: Diagnosis not present

## 2022-05-07 ENCOUNTER — Ambulatory Visit: Payer: PPO | Admitting: Rehabilitative and Restorative Service Providers"

## 2022-05-07 DIAGNOSIS — M542 Cervicalgia: Secondary | ICD-10-CM | POA: Diagnosis not present

## 2022-05-07 DIAGNOSIS — M5412 Radiculopathy, cervical region: Secondary | ICD-10-CM | POA: Diagnosis not present

## 2022-05-14 ENCOUNTER — Encounter: Payer: PPO | Admitting: Rehabilitative and Restorative Service Providers"

## 2022-05-14 DIAGNOSIS — M5412 Radiculopathy, cervical region: Secondary | ICD-10-CM | POA: Diagnosis not present

## 2022-05-21 ENCOUNTER — Encounter: Payer: PPO | Admitting: Rehabilitative and Restorative Service Providers"

## 2022-05-28 ENCOUNTER — Encounter: Payer: PPO | Admitting: Rehabilitative and Restorative Service Providers"

## 2022-05-28 DIAGNOSIS — M5412 Radiculopathy, cervical region: Secondary | ICD-10-CM | POA: Diagnosis not present

## 2022-06-18 DIAGNOSIS — M5412 Radiculopathy, cervical region: Secondary | ICD-10-CM | POA: Diagnosis not present

## 2022-07-12 DIAGNOSIS — M5412 Radiculopathy, cervical region: Secondary | ICD-10-CM | POA: Diagnosis not present

## 2022-07-12 DIAGNOSIS — M5413 Radiculopathy, cervicothoracic region: Secondary | ICD-10-CM | POA: Diagnosis not present

## 2022-07-12 DIAGNOSIS — M4802 Spinal stenosis, cervical region: Secondary | ICD-10-CM | POA: Diagnosis not present

## 2022-07-12 DIAGNOSIS — M4722 Other spondylosis with radiculopathy, cervical region: Secondary | ICD-10-CM | POA: Diagnosis not present

## 2022-07-12 DIAGNOSIS — M5013 Cervical disc disorder with radiculopathy, cervicothoracic region: Secondary | ICD-10-CM | POA: Diagnosis not present

## 2022-07-26 ENCOUNTER — Other Ambulatory Visit: Payer: Self-pay

## 2022-07-26 ENCOUNTER — Ambulatory Visit: Payer: PPO | Attending: Internal Medicine | Admitting: Physical Therapy

## 2022-07-26 ENCOUNTER — Encounter: Payer: Self-pay | Admitting: Physical Therapy

## 2022-07-26 DIAGNOSIS — M5412 Radiculopathy, cervical region: Secondary | ICD-10-CM | POA: Diagnosis not present

## 2022-07-26 DIAGNOSIS — M6281 Muscle weakness (generalized): Secondary | ICD-10-CM | POA: Insufficient documentation

## 2022-07-26 NOTE — Therapy (Signed)
OUTPATIENT PHYSICAL THERAPY CERVICAL EVALUATION   Patient Name: Edward Berger MRN: [id number] DOB:[birth year], 76 y.o., male Today's Date: 07/26/2022   PT End of Session - 07/26/22 0804     Visit Number 1    Date for PT Re-Evaluation 09/20/22    Authorization Type [employer] 10th visit prog note    PT Start Time 0804    PT Stop Time 0844    PT Time Calculation (min) 40 min    Activity Tolerance Patient tolerated treatment well             Past Medical History:  Diagnosis Date   Blood transfusion without reported diagnosis    years ago with surgery    Cervical radiculopathy, chronic    Past Surgical History:  Procedure Laterality Date   ANKLE SURGERY     right   COLONOSCOPY     ENT     nasal polyp removal    SHOULDER SURGERY     left   Patient Active Problem List   Diagnosis Date Noted   Achilles tendon disorder, 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   NECK PAIN 10/27/2008    PCP: Isaac Bliss, Olam Idler MD  REFERRING PROVIDER: Eustace Moore MD  REFERRING DIAG: M54.12 radiculopathy, cervical region  THERAPY DIAG:  Neck pain, weakness Rationale for Evaluation and Treatment: Rehabilitation  ONSET DATE: 07/12/22  SUBJECTIVE:                                                                                                                                                                                                         SUBJECTIVE STATEMENT: Months long history right shoulder pain, numbness in 3rd, 4th and 5th fingers secondary to cervical radiculopathy.  2 weeks ago underwent (07/12/22) cervical lami C7-T1 (posterior approach).  No fusion.  He reports full resolution of right UE symptoms however following surgery he had the new onset of left shoulder pain and limited ROM which patient states is the reason for seeking PT at this time.  He states the surgeon felt it could be attributed to that side being strapped down during the surgical procedure.  No previous issues  with left shoulder.    PERTINENT HISTORY:  07/12/22 cervical lami C7-T1 (posterior approach) 20# lift limit, no Rt UE overhead;  MD follow up in 1 month  PAIN:  PAIN:  Are you having pain? Yes NPRS scale: 2-3/10  sore at rest Pain location: left shoulder   Aggravating factors: stand with dangling left arm, abduction of left shoulder to put on deodorant Relieving factors: support arm rest  PRECAUTIONS: None  WEIGHT BEARING RESTRICTIONS: No  FALLS:  Has patient fallen in last 6 months? No   LIVING ENVIRONMENT: Lives with: lives with their spouse Lives in: House/apartment Stairs: Yes: Internal: 15 steps; on right going up and External: 4 steps; on left going up Has following equipment at home: None    OCCUPATION: retired  PLOF: Independent Independent and Leisure: reading, exercising, travelng    PATIENT GOALS: get ROM back in shoulder in left   OBJECTIVE:   DIAGNOSTIC FINDINGS:  Prior to surgery  PATIENT SURVEYS:  FOTO 50  COGNITION: Overall cognitive status: Within functional limits for tasks assessed  SENSATION: WFL  POSTURE: No Significant postural limitations   CERVICAL ROM: grossly 25% limited secondary to recent surgery  No tender points in shoulder musculature  UPPER EXTREMITY ROM: Compensatory upper trap overuse;  left scapular winging noted with single arm push up on wall, mild reduction in left pectoral length;   Some increased neural tension on left   Full passive ROM of left shoulder, no pain  Active ROM Right eval Left eval  Shoulder flexion 148 118  Shoulder extension    Shoulder abduction 155 110  Shoulder adduction    Shoulder extension    Shoulder internal rotation 85 82  Shoulder external rotation T6 T8  Elbow flexion    Elbow extension    Wrist flexion    Wrist extension    Wrist ulnar deviation    Wrist radial deviation    Wrist pronation    Wrist supination     (Blank rows = not tested)   UPPER EXTREMITY MMT:   decreased motor control noted with a sidelying shoulder lowering from abducted position Distal strength intact at the elbow, wrist and hand  MMT Right eval Left eval  Shoulder flexion MMT of shoulder not performed on right secondary to recent surgery 4+  Shoulder extension    Shoulder abduction  3+  Shoulder adduction    Shoulder extension    Shoulder internal rotation  5  Shoulder external rotation  5  Middle trapezius  4  Lower trapezius  4  Elbow flexion    Elbow extension    Wrist flexion    Wrist extension    Wrist ulnar deviation    Wrist radial deviation    Wrist pronation    Wrist supination    Grip strength     (Blank rows = not tested)   TODAY'S TREATMENT:                                                                                                                              DATE: 07/26/22   PATIENT EDUCATION:  Education details: plan of care Person educated: Patient Education method: Explanation Education comprehension: verbalized understanding  HOME EXERCISE PROGRAM: Access Code: [email or web address] URL: [email or web address] Date: 07/26/2022 Prepared by: Ruben Im  Exercises - Supine Shoulder Flexion Overhead with Dowel  - 1 x daily - 7 x  weekly - 1 sets - 10 reps - Seated Scapular Retraction  - 1 x daily - 7 x weekly - 1 sets - 10 reps - Supine Scapular Retraction  - 1 x daily - 7 x weekly - 1 sets - 10 reps  ASSESSMENT:  CLINICAL IMPRESSION: Patient is a 76 y.o. male who was seen today for physical therapy evaluation and treatment for s/p cervical lami for right radiculopathy and new onset of left shoulder/upper arm pain and limited ROM.  Patient underwent (07/12/22) cervical lami C7-T1 (posterior approach).   He reports full resolution of right UE symptoms however following surgery he had the new onset of left shoulder pain and limited ROM which patient states is the reason for seeking PT at this time.  He states the surgeon felt it could  be attributed to that side being strapped down during the surgical procedure.  He demonstrates decreased left shoulder elevation ROM and decreased motor control in shoulder and scapular muscles, particularly abduction. Increased discomfort with arm hanging by his side with walking and standing.  He would benefit from PT to address these deficits.   OBJECTIVE IMPAIRMENTS: decreased ROM, decreased strength, impaired perceived functional ability, and pain.   ACTIVITY LIMITATIONS: carrying, lifting, and reach over head  PARTICIPATION LIMITATIONS: community activity and yard work  PERSONAL FACTORS:  overall good health  are also affecting patient's functional outcome positively.   REHAB POTENTIAL: Good  CLINICAL DECISION MAKING: Stable/uncomplicated  EVALUATION COMPLEXITY: Low   GOALS: Goals reviewed with patient? Yes  SHORT TERM GOALS: Target date: 08/23/2022   The patient will demonstrate knowledge of basic self care strategies and exercises to promote healing   Baseline:  Goal status: INITIAL  2.  The patient will report a 30% improvement in pain levels with functional activities: standing with arm dangling and abducting left shoulder to put on deodorant Baseline:  Goal status: INITIAL  3.  Improved left shoulder flexion and abduction to 125 degrees needed for reaching to eye level Baseline:  Goal status: INITIAL  4.  Improved left shoulder abduction strength to 4-/5 needed for household tasks, assisting wife after her surgery Baseline:  Goal status: INITIAL      LONG TERM GOALS: Target date: 09/20/2022  The patient will be independent in a safe self progression of a home exercise program to promote further recovery of function   Baseline:  Goal status: INITIAL  2.  The patient will report a 70% improvement in pain levels with functional activities which are currently difficult including reaching overhead, walking with arm dangling by side and putting on  deodorant Baseline:  Goal status: INITIAL  3.  Left shoulder flex and abduction to 145 needed for reaching Baseline:  Goal status: INITIAL  4.  Left shoulder strength grossly 4 to 4+/5 needed for lifting/carrying light to medium objects Baseline:  Goal status: INITIAL  5.  The patient will have improved FOTO score to  65%    indicating improved function with less pain  Baseline:  Goal status: INITIAL    PLAN:  PT FREQUENCY: 1-2x/week  PT DURATION: 8 weeks  PLANNED INTERVENTIONS: Therapeutic exercises, Therapeutic activity, Neuromuscular re-education, Gait training, Patient/Family education, Self Care, Joint mobilization, Dry Needling, Electrical stimulation, Spinal mobilization, Cryotherapy, Moist heat, Taping, Traction, Ultrasound, Ionotophoresis '4mg'$ /ml Dexamethasone, Manual therapy, and Re-evaluation  PLAN FOR NEXT SESSION: gravity assisted left shoulder flexion and abduction;  gentle pectoral lengthening; scapular ex's; neural gliding  Ruben Im, PT 07/26/22 12:08 PM Phone: [phone] Fax: [phone]

## 2022-07-29 ENCOUNTER — Ambulatory Visit: Payer: PPO

## 2022-07-29 DIAGNOSIS — M5412 Radiculopathy, cervical region: Secondary | ICD-10-CM | POA: Diagnosis not present

## 2022-07-29 DIAGNOSIS — M6281 Muscle weakness (generalized): Secondary | ICD-10-CM

## 2022-07-29 NOTE — Therapy (Signed)
OUTPATIENT PHYSICAL THERAPY TREATMENT   Patient Name: Edward Berger MRN: [id number] DOB:Sep 16, 1946, 76 y.o., male Today's Date: 07/29/2022   PT End of Session - 07/29/22 0846     Visit Number 2    Date for PT Re-Evaluation 09/20/22    Authorization Type [employer] 10th visit prog note    Progress Note Due on Visit 10    PT Start Time 0803    PT Stop Time 0845    PT Time Calculation (min) 42 min    Activity Tolerance Patient tolerated treatment well    Behavior During Therapy [facility] for tasks assessed/performed              Past Medical History:  Diagnosis Date   Blood transfusion without reported diagnosis    years ago with surgery    Cervical radiculopathy, chronic    Past Surgical History:  Procedure Laterality Date   ANKLE SURGERY     right   COLONOSCOPY     ENT     nasal polyp removal    SHOULDER SURGERY     left   Patient Active Problem List   Diagnosis Date Noted   Achilles tendon disorder, 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   PERTINENT HISTORY:  07/12/22 cervical lami C7-T1 (posterior approach) 20# lift limit, no Rt UE overhead;  MD follow up in 1 month  PAIN:  PAIN:  Are you having pain? Yes NPRS scale: 1/10  sore at rest Pain location: left shoulder   Aggravating factors: stand with dangling left arm, abduction of left shoulder to put on deodorant Relieving factors: support arm rest    PRECAUTIONS: None  WEIGHT BEARING RESTRICTIONS: No  FALLS:  Has patient fallen in last 6 months? No   LIVING ENVIRONMENT: Lives with: lives with their spouse Lives in: House/apartment Stairs: Yes: Internal: 15 steps; on right going up and External: 4 steps; on left going up Has following equipment at home: None    OCCUPATION: retired  PLOF: Independent Independent  and Leisure: reading, exercising, travelng    PATIENT GOALS: get ROM back in shoulder in left   OBJECTIVE:   DIAGNOSTIC FINDINGS:  Prior to surgery  PATIENT SURVEYS:  FOTO 50  COGNITION: Overall cognitive status: Within functional limits for tasks assessed  SENSATION: WFL  POSTURE: No Significant postural limitations   CERVICAL ROM: grossly 25% limited secondary to recent surgery  No tender points in shoulder musculature  UPPER EXTREMITY ROM: Compensatory upper trap overuse;  left scapular winging noted with single arm push up on wall, mild reduction in left pectoral length;   Some increased neural tension on left   Full passive ROM of left shoulder, no pain  Active ROM Right eval Left eval  Shoulder flexion 148 118  Shoulder extension    Shoulder abduction 155 110  Shoulder adduction    Shoulder extension    Shoulder internal rotation 85 82  Shoulder external rotation T6 T8  Elbow flexion    Elbow extension    Wrist flexion    Wrist extension    Wrist ulnar deviation    Wrist radial deviation    Wrist pronation    Wrist supination     (Blank rows = not tested)   UPPER EXTREMITY MMT:  decreased motor control noted with a sidelying shoulder lowering from abducted position Distal strength intact at the elbow, wrist and hand  MMT Right eval Left eval  Shoulder flexion MMT of shoulder not performed on right secondary to recent surgery 4+  Shoulder extension    Shoulder abduction  3+  Shoulder adduction    Shoulder extension    Shoulder internal rotation  5  Shoulder external  rotation  5  Middle trapezius  4  Lower trapezius  4  Elbow flexion    Elbow extension    Wrist flexion    Wrist extension    Wrist ulnar deviation    Wrist radial deviation    Wrist pronation    Wrist supination    Grip strength     (Blank rows = not tested)   TODAY'S TREATMENT:    Date: 07/29/22 Arm bike: Level 1x 6 minutes (3/3)-PT present to discuss progress Finger ladder -flexion 5" hold x 10, abduction x 5 Shoulder extension and row with red band: 2x10  Pec stretch at doorway 10 seconds x 5  Overhead flexion with cane x10  Overhead flexion and narrow grip chest press 2# 2x10 Seated flexion and scaption: 2x10                                                                                                                      DATE: 07/26/22   PATIENT EDUCATION:  Education details: plan of care Person educated: Patient Education method: Explanation Education comprehension: verbalized understanding  HOME EXERCISE PROGRAM: Access Code: [facility] URL: [email or web address] Date: 07/29/2022 Prepared by: Claiborne Billings  Exercises - Supine Shoulder Flexion Overhead with Dowel  - 1 x daily - 7 x weekly - 1 sets - 10 reps -  Seated Scapular Retraction  - 1 x daily - 7 x weekly - 1 sets - 10 reps - Supine Scapular Retraction  - 1 x daily - 7 x weekly - 1 sets - 10 reps - Scapular Retraction with Resistance  - 1-2 x daily - 7 x weekly - 2 sets - 10 reps - Shoulder extension with resistance - Neutral  - 1-2 x daily - 7 x weekly - 2 sets - 10 reps - Doorway Pec Stretch at 90 Degrees Abduction  - 3 x daily - 7 x weekly - 3 sets - 10 reps  ASSESSMENT:  CLINICAL IMPRESSION: First time follow-up after evaluation.  Pt reports only tightness in his Lt shoulder today.  Pt did well with advancement of exercises for Lt shoulder strength against gravity.  Pt required minor and intermittent cueing for posture and alignment.  Patient will benefit from skilled PT to address the below  impairments and improve overall function.    OBJECTIVE IMPAIRMENTS: decreased ROM, decreased strength, impaired perceived functional ability, and pain.   ACTIVITY LIMITATIONS: carrying, lifting, and reach over head  PARTICIPATION LIMITATIONS: community activity and yard work  PERSONAL FACTORS:  overall good health  are also affecting patient's functional outcome positively.   REHAB POTENTIAL: Good  CLINICAL DECISION MAKING: Stable/uncomplicated  EVALUATION COMPLEXITY: Low   GOALS: Goals reviewed with patient? Yes  SHORT TERM GOALS: Target date: 08/23/2022   The patient will demonstrate knowledge of basic self care strategies and exercises to promote healing   Baseline:  Goal status: INITIAL  2.  The patient will report a 30% improvement in pain levels with functional activities: standing with arm dangling and abducting left shoulder to put on deodorant Baseline:  Goal status: INITIAL  3.  Improved left shoulder flexion and abduction to 125 degrees needed for reaching to eye level Baseline:  Goal status: INITIAL  4.  Improved left shoulder abduction strength to 4-/5 needed for household tasks, assisting wife after her surgery Baseline:  Goal status: INITIAL      LONG TERM GOALS: Target date: 09/20/2022  The patient will be independent in a safe self progression of a home exercise program to promote further recovery of function   Baseline:  Goal status: INITIAL  2.  The patient will report a 70% improvement in pain levels with functional activities which are currently difficult including reaching overhead, walking with arm dangling by side and putting on deodorant Baseline:  Goal status: INITIAL  3.  Left shoulder flex and abduction to 145 needed for reaching Baseline:  Goal status: INITIAL  4.  Left shoulder strength grossly 4 to 4+/5 needed for lifting/carrying light to medium objects Baseline:  Goal status: INITIAL  5.  The patient will have improved FOTO  score to  65%    indicating improved function with less pain  Baseline:  Goal status: INITIAL    PLAN:  PT FREQUENCY: 1-2x/week  PT DURATION: 8 weeks  PLANNED INTERVENTIONS: Therapeutic exercises, Therapeutic activity, Neuromuscular re-education, Gait training, Patient/Family education, Self Care, Joint mobilization, Dry Needling, Electrical stimulation, Spinal mobilization, Cryotherapy, Moist heat, Taping, Traction, Ultrasound, Ionotophoresis '4mg'$ /ml Dexamethasone, Manual therapy, and Re-evaluation  PLAN FOR NEXT SESSION: Lt shoulder ROM, strength and flexibility  Sigurd Sos, PT 07/29/22 8:48 AM   Phone: [phone] Fax: [phone]

## 2022-07-29 NOTE — Therapy (Unsigned)
OUTPATIENT PHYSICAL THERAPY TREATMENT   Patient Name: Edward Berger MRN: [id number] DOB:Apr 23, 1946, 76 y.o., male Today's Date: 08/01/2022   PT End of Session - 08/01/22 0944     Visit Number 3    Date for PT Re-Evaluation 09/20/22    Authorization Type [employer] 10th visit prog note    Progress Note Due on Visit 10    PT Start Time 0847    PT Stop Time 0928    PT Time Calculation (min) 41 min    Activity Tolerance Patient tolerated treatment well    Behavior During Therapy [facility] for tasks assessed/performed               Past Medical History:  Diagnosis Date   Blood transfusion without reported diagnosis    years ago with surgery    Cervical radiculopathy, chronic    Past Surgical History:  Procedure Laterality Date   ANKLE SURGERY     right   COLONOSCOPY     ENT     nasal polyp removal    SHOULDER SURGERY     left   Patient Active Problem List   Diagnosis Date Noted   Achilles tendon disorder, 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 I have not done the pec stretch as it has bothered my shoulder when performing it.   PERTINENT HISTORY:  07/12/22 cervical lami C7-T1 (posterior approach) 20# lift limit, no Rt UE overhead;  MD follow up in 1 month  PAIN:  PAIN:  Are you having pain? Yes NPRS scale: 1/10  sore at rest Pain location: left shoulder   Aggravating factors: stand with dangling left arm, abduction of left shoulder to put on  deodorant Relieving factors: support arm rest   PRECAUTIONS: None  WEIGHT BEARING RESTRICTIONS: No  FALLS:  Has patient fallen in last 6 months? No   LIVING ENVIRONMENT: Lives with: lives with their spouse Lives in: House/apartment Stairs: Yes: Internal: 15 steps; on right going up and External: 4 steps; on left going up Has following equipment at home:  None    OCCUPATION: retired  PLOF: Independent Independent and Leisure: reading, exercising, travelng    PATIENT GOALS: get ROM back in shoulder in left   OBJECTIVE:   DIAGNOSTIC FINDINGS:  Prior to surgery  PATIENT SURVEYS:  FOTO 50  COGNITION: Overall cognitive status: Within functional limits for tasks assessed  SENSATION: WFL  POSTURE: No Significant postural limitations   CERVICAL ROM: grossly 25% limited secondary to recent surgery  No tender points in shoulder musculature  UPPER EXTREMITY ROM: Compensatory upper trap overuse;  left scapular winging noted with single arm push up on wall, mild reduction in left pectoral length;   Some increased neural tension on left   Full passive ROM of left shoulder, no pain  Active ROM Right eval Left eval  Shoulder flexion 148 118  Shoulder extension    Shoulder abduction 155 110  Shoulder adduction    Shoulder extension    Shoulder internal rotation 85 82  Shoulder external rotation T6 T8  Elbow flexion    Elbow extension    Wrist flexion    Wrist extension    Wrist ulnar deviation    Wrist radial deviation    Wrist pronation    Wrist supination     (Blank rows = not tested)   UPPER EXTREMITY MMT:  decreased motor control noted with a sidelying shoulder lowering from abducted position Distal strength intact at the elbow, wrist and hand  MMT Right eval Left eval  Shoulder flexion MMT of shoulder not performed on right secondary to recent surgery 4+  Shoulder extension    Shoulder abduction  3+  Shoulder adduction    Shoulder extension     Shoulder internal rotation  5  Shoulder external rotation  5  Middle trapezius  4  Lower trapezius  4  Elbow flexion    Elbow extension    Wrist flexion    Wrist extension    Wrist ulnar deviation    Wrist radial deviation    Wrist pronation    Wrist supination    Grip strength     (Blank rows = not tested)   TODAY'S TREATMENT:    Date: 08/01/22 Arm bike: Level 1x 6 minutes (3/3)-PT present to discuss progress Finger ladder -flexion 5" hold x 10, abduction x 5 Shoulder extension and row with green band: 2x10 ER with green band 2x10, bilat  Pec stretch at doorway 10 seconds x 5  Overhead flexion with 1# weight, bilat, 2x10 Seated flexion and scaption, 1#: 2x10, bilat  Manual in seated position to assess GH capsule. Pt with anterior translation. Posterior mobs with improvements with OH flexion following.   Date: 07/29/22 Arm bike: Level 1x 6 minutes (3/3)-PT present to discuss progress Finger ladder -flexion 5" hold x 10, abduction x 5 Shoulder extension and row with red band: 2x10  Pec stretch at doorway 10 seconds x 5  Overhead flexion with cane x10  Overhead flexion and narrow grip chest press 2# 2x10 Seated flexion and scaption: 2x10  DATE: 07/26/22   PATIENT EDUCATION:  Education details: plan of care Person educated: Patient Education method: Explanation Education comprehension: verbalized understanding  HOME EXERCISE PROGRAM: Access Code: [email or web address] URL: [email or web address] Date: 07/29/2022 Prepared by: Claiborne Billings  Exercises - Supine Shoulder Flexion Overhead with Dowel  - 1 x daily - 7 x weekly - 1 sets - 10 reps - Seated Scapular Retraction  - 1 x daily - 7 x weekly - 1 sets - 10 reps - Supine Scapular Retraction  - 1 x daily - 7 x weekly - 1 sets - 10 reps - Scapular Retraction with Resistance  - 1-2 x daily - 7 x weekly - 2 sets  - 10 reps - Shoulder extension with resistance - Neutral  - 1-2 x daily - 7 x weekly - 2 sets - 10 reps - Doorway Pec Stretch at 90 Degrees Abduction  - 3 x daily - 7 x weekly - 3 sets - 10 reps  ASSESSMENT:  CLINICAL IMPRESSION: Pt reports to PT with improvements in shoulder stiffness. Pt did well with advancement of exercises for Lt shoulder strength with focus on parascapular musculature.  Modified pec stretch due to pt reporting some pain with OH stretch, with improvements noted in symptoms. Updated HEP and provided pt with green theraband. Pt required minor and intermittent cueing for posture and alignment.  Patient will benefit from skilled PT to address the below impairments and improve overall function.    OBJECTIVE IMPAIRMENTS: decreased ROM, decreased strength, impaired perceived functional ability, and pain.   ACTIVITY LIMITATIONS: carrying, lifting, and reach over head  PARTICIPATION LIMITATIONS: community activity and yard work  PERSONAL FACTORS:  overall good health  are also affecting patient's functional outcome positively.   REHAB POTENTIAL: Good  CLINICAL DECISION MAKING: Stable/uncomplicated  EVALUATION COMPLEXITY: Low   GOALS: Goals reviewed with patient? Yes  SHORT TERM GOALS: Target date: 08/23/2022   The patient will demonstrate knowledge of basic self care strategies and exercises to promote healing   Baseline:  Goal status: INITIAL  2.  The patient will report a 30% improvement in pain levels with functional activities: standing with arm dangling and abducting left shoulder to put on deodorant Baseline:  Goal status: INITIAL  3.  Improved left shoulder flexion and abduction to 125 degrees needed for reaching to eye level Baseline:  Goal status: INITIAL  4.  Improved left shoulder abduction strength to 4-/5 needed for household tasks, assisting wife after her surgery Baseline:  Goal status: INITIAL      LONG TERM GOALS: Target date:  09/20/2022  The patient will be independent in a safe self progression of a home exercise program to promote further recovery of function   Baseline:  Goal status: INITIAL  2.  The patient will report a 70% improvement in pain levels with functional activities which are currently difficult including reaching overhead, walking with arm dangling by side and putting on deodorant Baseline:  Goal status: INITIAL  3.  Left shoulder flex and abduction to 145 needed for reaching Baseline:  Goal status: INITIAL  4.  Left shoulder strength grossly 4 to 4+/5 needed for lifting/carrying light to medium objects Baseline:  Goal status: INITIAL  5.  The patient will have improved FOTO score to  65%    indicating improved function with less pain  Baseline:  Goal status: INITIAL    PLAN:  PT FREQUENCY: 1-2x/week  PT DURATION: 8 weeks  PLANNED INTERVENTIONS: Therapeutic exercises, Therapeutic activity, Neuromuscular re-education, Gait training, Patient/Family education,  Self Care, Joint mobilization, Dry Needling, Electrical stimulation, Spinal mobilization, Cryotherapy, Moist heat, Taping, Traction, Ultrasound, Ionotophoresis '4mg'$ /ml Dexamethasone, Manual therapy, and Re-evaluation  PLAN FOR NEXT SESSION: Lt shoulder ROM, strength and flexibility  Rudi Heap PT, DPT 08/01/22  9:47 AM

## 2022-08-01 ENCOUNTER — Encounter: Payer: Self-pay | Admitting: Physical Therapy

## 2022-08-01 ENCOUNTER — Ambulatory Visit: Payer: PPO | Attending: Internal Medicine | Admitting: Physical Therapy

## 2022-08-01 DIAGNOSIS — M5412 Radiculopathy, cervical region: Secondary | ICD-10-CM | POA: Diagnosis not present

## 2022-08-01 DIAGNOSIS — M6281 Muscle weakness (generalized): Secondary | ICD-10-CM | POA: Diagnosis not present

## 2022-08-01 DIAGNOSIS — M542 Cervicalgia: Secondary | ICD-10-CM | POA: Diagnosis not present

## 2022-08-07 ENCOUNTER — Ambulatory Visit: Payer: PPO

## 2022-08-07 DIAGNOSIS — M542 Cervicalgia: Secondary | ICD-10-CM

## 2022-08-07 DIAGNOSIS — M6281 Muscle weakness (generalized): Secondary | ICD-10-CM

## 2022-08-07 DIAGNOSIS — M5412 Radiculopathy, cervical region: Secondary | ICD-10-CM

## 2022-08-07 NOTE — Therapy (Signed)
OUTPATIENT PHYSICAL THERAPY TREATMENT   Patient Name: Edward Berger MRN: [id number] DOB:08/07/1946, 76 y.o., male Today's Date: 08/07/2022   PT End of Session - 08/07/22 1054     Visit Number 4    Date for PT Re-Evaluation 09/20/22    Authorization Type [employer] 10th visit prog note    Progress Note Due on Visit 10    PT Start Time 1016    PT Stop Time 1053    PT Time Calculation (min) 37 min    Activity Tolerance Patient tolerated treatment well    Behavior During Therapy WFL for tasks assessed/performed                Past Medical History:  Diagnosis Date   Blood transfusion without reported diagnosis    years ago with surgery    Cervical radiculopathy, chronic    Past Surgical History:  Procedure Laterality Date   ANKLE SURGERY     right   COLONOSCOPY     ENT     nasal polyp removal    SHOULDER SURGERY     left   Patient Active Problem List   Diagnosis Date Noted   Achilles tendon disorder, 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   PERTINENT HISTORY:  07/12/22 cervical lami C7-T1 (posterior approach) 20# lift limit, no Rt UE overhead;  MD follow up in 1 month  PAIN:  PAIN:  Are you having pain? Yes No pain today NPRS scale: 2/10 max pain reported with stretching shoulder  Pain location: left shoulder  Aggravating factors: stand with dangling left arm, abduction of left shoulder to put on deodorant Relieving factors: support arm  rest   PRECAUTIONS: None  WEIGHT BEARING RESTRICTIONS: No  FALLS:  Has patient fallen in last 6 months? No   LIVING ENVIRONMENT: Lives with: lives with their spouse Lives in: House/apartment Stairs: Yes: Internal: 15 steps; on right going up and External: 4 steps; on left going up Has following equipment at home: None    OCCUPATION: retired  PLOF: Independent Independent  and Leisure: reading, exercising, travelng    PATIENT GOALS: get ROM back in shoulder in left   OBJECTIVE:   DIAGNOSTIC FINDINGS:  Prior to surgery  PATIENT SURVEYS:  FOTO 50  COGNITION: Overall cognitive status: Within functional limits for tasks assessed  SENSATION: WFL  POSTURE: No Significant postural limitations   CERVICAL ROM: grossly 25% limited secondary to recent surgery  No tender points in shoulder musculature  UPPER EXTREMITY ROM: Compensatory upper trap overuse;  left scapular winging noted with single arm push up on wall, mild reduction in left pectoral length;   Some increased neural tension on left   Full passive ROM of left shoulder, no pain  Active ROM Right eval Left eval Left 08/07/22  Shoulder flexion 148 118 130  Shoulder extension     Shoulder abduction 155 110 110  Shoulder adduction     Shoulder extension     Shoulder internal rotation 85 82   Shoulder external rotation T6 T8 T4  Elbow flexion     Elbow extension     Wrist flexion     Wrist extension     Wrist ulnar deviation     Wrist radial deviation     Wrist pronation     Wrist supination      (Blank rows = not tested)   UPPER EXTREMITY MMT:  decreased motor control noted with a sidelying shoulder lowering from abducted position Distal strength intact at the elbow, wrist and hand  MMT Right eval Left eval Left  08/07/22  Shoulder flexion MMT of shoulder not performed on right secondary to recent surgery 4+   Shoulder extension     Shoulder abduction  3+ 4-  Shoulder adduction     Shoulder  extension     Shoulder internal rotation  5   Shoulder external rotation  5   Middle trapezius  4   Lower trapezius  4   Elbow flexion     Elbow extension     Wrist flexion     Wrist extension     Wrist ulnar deviation     Wrist radial deviation     Wrist pronation     Wrist supination     Grip strength      (Blank rows = not tested)   TODAY'S TREATMENT:    Date: 08/07/22 Arm bike: Level 1x 6 minutes (3/3)-PT present to discuss progress Finger ladder -flexion 5" hold x 10, abduction x 5- added 1# to wrist Shoulder extension and row with green band: 2x10 ER with red band 2x10, bilat  Supine: overhead flexion with 2# weight, bilat, 2x10 Supine: circles CW and CCW with 2# weight with shoulder facing ceiling 2x10 each Seated flexion and scaption, 1#: 2x10, bilat   Date: 08/01/22 Arm bike: Level 1x 6 minutes (3/3)-PT present to discuss progress Finger ladder -flexion 5" hold x 10, abduction x 5 Shoulder extension and row with green band: 2x10 ER with green band 2x10, bilat  Pec stretch at doorway 10 seconds x 5  Overhead flexion with 1# weight, bilat, 2x10 Seated flexion and scaption, 1#: 2x10, bilat  Manual in seated position to assess GH capsule. Pt with anterior translation. Posterior mobs with improvements with OH flexion following.   Date: 07/29/22 Arm bike: Level 1x 6 minutes (3/3)-PT present to discuss progress Finger ladder -flexion 5" hold x 10, abduction x 5 Shoulder extension and row with red band: 2x10  Pec stretch at doorway 10 seconds x 5  Overhead flexion with  cane x10  Overhead flexion and narrow grip chest press 2# 2x10 Seated flexion and scaption: 2x10                                                                                                                      PATIENT EDUCATION:  Education details: plan of care Person educated: Patient Education method: Explanation Education comprehension: verbalized understanding  HOME EXERCISE  PROGRAM: Access Code: [email or web address] URL: [email or web address] Date: 07/29/2022 Prepared by: Claiborne Billings  Exercises - Supine Shoulder Flexion Overhead with Dowel  - 1 x daily - 7 x weekly - 1 sets - 10 reps - Seated Scapular Retraction  - 1 x daily - 7 x weekly - 1 sets - 10 reps - Supine Scapular Retraction  - 1 x daily - 7 x weekly - 1 sets - 10 reps - Scapular Retraction with Resistance  - 1-2 x daily - 7 x weekly - 2 sets - 10 reps - Shoulder extension with resistance - Neutral  - 1-2 x daily - 7 x weekly - 2 sets - 10 reps - Doorway Pec Stretch at 90 Degrees Abduction  - 3 x daily - 7 x weekly - 3 sets - 10 reps  ASSESSMENT:  CLINICAL IMPRESSION: Pt reports 50% overall improvement in shoulder symptoms since the start of care.  He is able to put deodorant under his left arm with fewer modifications.  Lt shoulder flexion A/ROM is improved and abduction strength is also improved.  Pt reports that green band is too challenging to perform with green theraband and he returned to using red for this. Green band is appropriate for shoulder extension and rowing. Pt continues to be compliant in HEP for shoulder strength and flexibility.  Pt required minor and intermittent cueing for posture and alignment.  Most frequently, pt tucks his feet under his seat, resulting in flexed posture.  Patient will benefit from skilled PT to address the below impairments and improve overall function.    OBJECTIVE IMPAIRMENTS: decreased ROM, decreased strength, impaired perceived functional ability, and pain.   ACTIVITY LIMITATIONS: carrying, lifting, and reach over head  PARTICIPATION LIMITATIONS: community activity and yard work  PERSONAL FACTORS:  overall good health  are also affecting patient's functional outcome positively.   REHAB POTENTIAL: Good  CLINICAL DECISION MAKING: Stable/uncomplicated  EVALUATION COMPLEXITY: Low   GOALS: Goals reviewed with patient? Yes  SHORT TERM GOALS: Target  date: 08/23/2022   The patient will demonstrate knowledge of basic self care strategies and exercises to promote healing   Baseline:  Goal status: MET  2.  The patient will report a 30% improvement in pain levels with functional activities: standing with arm dangling and abducting left shoulder to put on deodorant Baseline: 50% (08/07/22) Goal status: MET  3.  Improved left shoulder flexion and abduction to 125 degrees needed for reaching to eye level Baseline: 130 (08/17/22) Goal status: MET  4.  Improved left shoulder abduction strength to 4-/5 needed for household tasks, assisting wife after  her surgery Baseline: 4-/5 Goal status: MET      LONG TERM GOALS: Target date: 09/20/2022  The patient will be independent in a safe self progression of a home exercise program to promote further recovery of function   Baseline:  Goal status: INITIAL  2.  The patient will report a 70% improvement in pain levels with functional activities which are currently difficult including reaching overhead, walking with arm dangling by side and putting on deodorant Baseline:  Goal status: INITIAL  3.  Left shoulder flex and abduction to 145 needed for reaching Baseline:  Goal status: INITIAL  4.  Left shoulder strength grossly 4 to 4+/5 needed for lifting/carrying light to medium objects Baseline:  Goal status: INITIAL  5.  The patient will have improved FOTO score to  65%    indicating improved function with less pain  Baseline:  Goal status: INITIAL    PLAN:  PT FREQUENCY: 1-2x/week  PT DURATION: 8 weeks  PLANNED INTERVENTIONS: Therapeutic exercises, Therapeutic activity, Neuromuscular re-education, Gait training, Patient/Family education, Self Care, Joint mobilization, Dry Needling, Electrical stimulation, Spinal mobilization, Cryotherapy, Moist heat, Taping, Traction, Ultrasound, Ionotophoresis 43m/ml Dexamethasone, Manual therapy, and Re-evaluation  PLAN FOR NEXT SESSION: Lt  shoulder ROM, strength and flexibility  KSigurd Sos PT 08/07/22 10:54 AM

## 2022-08-08 NOTE — Therapy (Signed)
OUTPATIENT PHYSICAL THERAPY TREATMENT   Patient Name: Edward Berger MRN: [id number] DOB:1946/08/17, 76 y.o., male Today's Date: 08/09/2022   PT End of Session - 08/09/22 1202     Visit Number 5    Date for PT Re-Evaluation 09/20/22    Authorization Type [employer] 10th visit prog note    Progress Note Due on Visit 10    PT Start Time 1106    PT Stop Time 1144    PT Time Calculation (min) 38 min    Activity Tolerance Patient tolerated treatment well    Behavior During Therapy WFL for tasks assessed/performed              Past Medical History:  Diagnosis Date   Blood transfusion without reported diagnosis    years ago with surgery    Cervical radiculopathy, chronic    Past Surgical History:  Procedure Laterality Date   ANKLE SURGERY     right   COLONOSCOPY     ENT     nasal polyp removal    SHOULDER SURGERY     left   Patient Active Problem List   Diagnosis Date Noted   Achilles tendon disorder, 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   PERTINENT HISTORY:  07/12/22 cervical lami C7-T1 (posterior approach) 20# lift limit, no Rt UE overhead;  MD follow up in 1 month  PAIN:  PAIN:  Are you having pain? Yes No pain today NPRS scale: 2/10 max pain reported with stretching shoulder  Pain location: left shoulder  Aggravating factors: stand with dangling left arm, abduction of left shoulder to put on  deodorant Relieving factors: support arm rest   PRECAUTIONS: None  WEIGHT BEARING RESTRICTIONS: No  FALLS:  Has patient fallen in last 6 months? No   LIVING ENVIRONMENT: Lives with: lives with their spouse Lives in: House/apartment Stairs: Yes: Internal: 15 steps; on right going up and External: 4 steps; on left going up Has following equipment at home: None    OCCUPATION:  retired  PLOF: Independent Independent and Leisure: reading, exercising, travelng    PATIENT GOALS: get ROM back in shoulder in left   OBJECTIVE:   DIAGNOSTIC FINDINGS:  Prior to surgery  PATIENT SURVEYS:  FOTO 50  COGNITION: Overall cognitive status: Within functional limits for tasks assessed  SENSATION: WFL  POSTURE: No Significant postural limitations   CERVICAL ROM: grossly 25% limited secondary to recent surgery  No tender points in shoulder musculature  UPPER EXTREMITY ROM: Compensatory upper trap overuse;  left scapular winging noted with single arm push up on wall, mild reduction in left pectoral length;   Some increased neural tension on left   Full passive ROM of left shoulder, no pain  Active ROM Right eval Left eval Left 08/07/22  Shoulder flexion 148 118 130  Shoulder extension     Shoulder abduction 155 110 110  Shoulder adduction     Shoulder extension     Shoulder internal rotation 85 82   Shoulder external rotation T6 T8 T4  Elbow flexion     Elbow extension     Wrist flexion     Wrist extension     Wrist ulnar deviation     Wrist radial deviation     Wrist pronation     Wrist supination      (Blank rows = not tested)   UPPER EXTREMITY MMT:  decreased motor control noted with a sidelying shoulder lowering from abducted position Distal strength intact at the elbow, wrist and hand  MMT Right eval Left eval Left  08/07/22  Shoulder flexion MMT of shoulder not performed on right secondary to recent surgery 4+   Shoulder extension     Shoulder abduction   3+ 4-  Shoulder adduction     Shoulder extension     Shoulder internal rotation  5   Shoulder external rotation  5   Middle trapezius  4   Lower trapezius  4   Elbow flexion     Elbow extension     Wrist flexion     Wrist extension     Wrist ulnar deviation     Wrist radial deviation     Wrist pronation     Wrist supination     Grip strength      (Blank rows = not tested)   TODAY'S TREATMENT:  Date 08/09/2022:  Arm bike: Level 1x 6 minutes (3/3)-PT present to discuss progress Wall slides into abduction for lat stretch 10x, 3 sec hold Rows/ lat pull downs 2x10 with GTB. STM to L UT, and L lat to reduce muscle tension.   Trigger Point Dry-Needling  Treatment instructions: Expect mild to moderate muscle soreness. S/S of pneumothorax if dry needled over a lung field, and to seek immediate medical attention should they occur. Patient verbalized understanding of these instructions and education.  Patient Consent Given: Yes Education handout provided: No Muscles treated: L UT  Electrical stimulation performed: No Parameters: N/A Treatment response/outcome: Improved tissue flexibility.   Date: 08/07/22 Arm bike: Level 1x 6 minutes (3/3)-PT present to discuss progress Finger ladder -flexion 5" hold x 10, abduction x 5- added 1# to wrist Shoulder extension and row with green band: 2x10 ER with red band 2x10, bilat  Supine: overhead flexion with 2# weight, bilat, 2x10 Supine: circles CW and CCW with 2# weight with shoulder facing ceiling 2x10 each Seated flexion and scaption, 1#: 2x10, bilat  Date: 08/07/22 Arm bike: Level 1x 6 minutes (3/3)-PT present to discuss progress Finger ladder -  flexion 5" hold x 10, abduction x 5- added 1# to wrist Shoulder extension and row with green band: 2x10 ER with red band 2x10, bilat  Supine: overhead flexion with 2# weight, bilat, 2x10 Supine: circles CW and CCW with 2# weight with shoulder facing ceiling 2x10 each Seated flexion and scaption,  1#: 2x10, bilat   Date: 08/01/22 Arm bike: Level 1x 6 minutes (3/3)-PT present to discuss progress Finger ladder -flexion 5" hold x 10, abduction x 5 Shoulder extension and row with green band: 2x10 ER with green band 2x10, bilat  Pec stretch at doorway 10 seconds x 5  Overhead flexion with 1# weight, bilat, 2x10 Seated flexion and scaption, 1#: 2x10, bilat  Manual in seated position to assess GH capsule. Pt with anterior translation. Posterior mobs with improvements with OH flexion following.   Date: 07/29/22 Arm bike: Level 1x 6 minutes (3/3)-PT present to discuss progress Finger ladder -flexion 5" hold x 10, abduction x 5 Shoulder extension and row with red band: 2x10  Pec stretch at doorway 10 seconds x 5  Overhead flexion with cane x10  Overhead flexion and narrow grip chest press 2# 2x10 Seated flexion and scaption: 2x10                                                                                                                      PATIENT EDUCATION:  Education details: plan of care Person educated: Patient Education method: Explanation Education comprehension: verbalized understanding  HOME EXERCISE PROGRAM: Access Code: [facility] URL: [email or web address] Date: 07/29/2022 Prepared by: Claiborne Billings  Exercises - Supine Shoulder Flexion Overhead with Dowel  - 1 x daily - 7 x weekly - 1 sets - 10 reps - Seated Scapular Retraction  - 1 x daily - 7 x weekly - 1 sets - 10 reps - Supine Scapular Retraction  - 1 x daily - 7 x weekly - 1 sets - 10 reps - Scapular Retraction with Resistance  - 1-2 x daily - 7 x weekly - 2 sets - 10 reps - Shoulder extension with resistance - Neutral  - 1-2 x daily - 7 x weekly - 2 sets - 10 reps - Doorway Pec Stretch at 90 Degrees Abduction  - 3 x daily - 7 x weekly - 3 sets - 10 reps  ASSESSMENT:  CLINICAL IMPRESSION: Pt reports noted improvements, but is frustrated with the slow progressions. He states that he continues to have  the most trouble putting deodorant on. After assessment of Lt shoulder, pt with significant protraction and tilting of scapulae. STM performed to Lt UT, and Latt with improvements noted with OH reaching. Finished session with shoulder strengthening. Encouraged pt to start using heat on his shoulder to help with tension while stretching at home. Pt continues to be compliant in HEP for shoulder strength and flexibility.  Pt required minor and intermittent cueing for posture and alignment. Patient will benefit from skilled PT to address the below impairments and improve overall function.    OBJECTIVE  IMPAIRMENTS: decreased ROM, decreased strength, impaired perceived functional ability, and pain.   ACTIVITY LIMITATIONS: carrying, lifting, and reach over head  PARTICIPATION LIMITATIONS: community activity and yard work  PERSONAL FACTORS:  overall good health  are also affecting patient's functional outcome positively.   REHAB POTENTIAL: Good  CLINICAL DECISION MAKING: Stable/uncomplicated  EVALUATION COMPLEXITY: Low   GOALS: Goals reviewed with patient? Yes  SHORT TERM GOALS: Target date: 08/23/2022   The patient will demonstrate knowledge of basic self care strategies and exercises to promote healing   Baseline:  Goal status: MET  2.  The patient will report a 30% improvement in pain levels with functional activities: standing with arm dangling and abducting left shoulder to put on deodorant Baseline: 50% (08/07/22) Goal status: MET  3.  Improved left shoulder flexion and abduction to 125 degrees needed for reaching to eye level Baseline: 130 (08/17/22) Goal status: MET  4.  Improved left shoulder abduction strength to 4-/5 needed for household tasks, assisting wife after her surgery Baseline: 4-/5 Goal status: MET      LONG TERM GOALS: Target date: 09/20/2022  The patient will be independent in a safe self progression of a home exercise program to promote further recovery of  function   Baseline:  Goal status: INITIAL  2.  The patient will report a 70% improvement in pain levels with functional activities which are currently difficult including reaching overhead, walking with arm dangling by side and putting on deodorant Baseline:  Goal status: INITIAL  3.  Left shoulder flex and abduction to 145 needed for reaching Baseline:  Goal status: INITIAL  4.  Left shoulder strength grossly 4 to 4+/5 needed for lifting/carrying light to medium objects Baseline:  Goal status: INITIAL  5.  The patient will have improved FOTO score to  65%    indicating improved function with less pain  Baseline:  Goal status: INITIAL    PLAN:  PT FREQUENCY: 1-2x/week  PT DURATION: 8 weeks  PLANNED INTERVENTIONS: Therapeutic exercises, Therapeutic activity, Neuromuscular re-education, Gait training, Patient/Family education, Self Care, Joint mobilization, Dry Needling, Electrical stimulation, Spinal mobilization, Cryotherapy, Moist heat, Taping, Traction, Ultrasound, Ionotophoresis 64m/ml Dexamethasone, Manual therapy, and Re-evaluation  PLAN FOR NEXT SESSION: Lt shoulder ROM, strength and flexibility  SRudi HeapPT, DPT 08/09/22  12:03 PM

## 2022-08-09 ENCOUNTER — Encounter: Payer: Self-pay | Admitting: Physical Therapy

## 2022-08-09 ENCOUNTER — Ambulatory Visit: Payer: PPO | Admitting: Physical Therapy

## 2022-08-09 DIAGNOSIS — M5412 Radiculopathy, cervical region: Secondary | ICD-10-CM

## 2022-08-09 DIAGNOSIS — M542 Cervicalgia: Secondary | ICD-10-CM

## 2022-08-09 DIAGNOSIS — M6281 Muscle weakness (generalized): Secondary | ICD-10-CM

## 2022-08-12 ENCOUNTER — Ambulatory Visit: Payer: PPO | Admitting: Physical Therapy

## 2022-08-12 DIAGNOSIS — M542 Cervicalgia: Secondary | ICD-10-CM

## 2022-08-12 DIAGNOSIS — M6281 Muscle weakness (generalized): Secondary | ICD-10-CM | POA: Diagnosis not present

## 2022-08-12 NOTE — Therapy (Signed)
OUTPATIENT PHYSICAL THERAPY TREATMENT   Patient Name: Edward Berger MRN: [id number] DOB:01-02-1946, 76 y.o., male Today's Date: 08/12/2022   PT End of Session - 08/12/22 0901     Visit Number 6    Date for PT Re-Evaluation 09/20/22    Authorization Type [employer] 10th visit prog note    Progress Note Due on Visit 10    PT Start Time 0845    Activity Tolerance Patient tolerated treatment well    Behavior During Therapy [facility] for tasks assessed/performed               Past Medical History:  Diagnosis Date   Blood transfusion without reported diagnosis    years ago with surgery    Cervical radiculopathy, chronic    Past Surgical History:  Procedure Laterality Date   ANKLE SURGERY     right   COLONOSCOPY     ENT     nasal polyp removal    SHOULDER SURGERY     left   Patient Active Problem List   Diagnosis Date Noted   Achilles tendon disorder, 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 "We are going in the right direction".   PERTINENT HISTORY:  07/12/22 cervical lami C7-T1 (posterior approach) 20# lift limit, no Rt UE overhead;  MD follow up in 1 month  PAIN:  PAIN:  Are you having pain? Yes No pain today NPRS scale: 2/10 max pain reported with stretching shoulder  Pain location: left shoulder  Aggravating factors: stand with dangling left arm, abduction of left shoulder to put on deodorant Relieving factors:  support arm rest   PRECAUTIONS: None  WEIGHT BEARING RESTRICTIONS: No  FALLS:  Has patient fallen in last 6 months? No   LIVING ENVIRONMENT: Lives with: lives with their spouse Lives in: House/apartment Stairs: Yes: Internal: 15 steps; on right going up and External: 4 steps; on left going up Has following equipment at home: None    OCCUPATION: retired  PLOF: Independent Independent and Leisure: reading, exercising, travelng  PATIENT GOALS: get ROM back in shoulder in left   OBJECTIVE:   DIAGNOSTIC FINDINGS:  Prior to surgery  PATIENT SURVEYS:  FOTO 50  COGNITION: Overall cognitive status: Within functional limits for tasks assessed  SENSATION: WFL  POSTURE: No Significant postural limitations   CERVICAL ROM: grossly 25% limited secondary to recent surgery  No tender points in shoulder musculature  UPPER EXTREMITY ROM: Compensatory upper trap overuse;  left scapular winging noted with single arm push up on wall, mild reduction in left pectoral length;   Some increased neural tension on left   Full passive ROM of left shoulder, no pain  Active ROM Right eval Left eval Left 08/07/22  Shoulder flexion 148 118 130  Shoulder extension     Shoulder abduction 155 110 110  Shoulder adduction     Shoulder extension     Shoulder internal rotation 85 82   Shoulder external rotation T6 T8 T4  Elbow flexion     Elbow extension     Wrist flexion     Wrist extension     Wrist ulnar deviation     Wrist radial deviation     Wrist pronation     Wrist supination      (Blank rows = not tested)   UPPER EXTREMITY MMT:  decreased motor control noted with a sidelying shoulder lowering from abducted position Distal strength intact at the elbow, wrist and hand  MMT Right eval Left eval Left  08/07/22  Shoulder flexion MMT of shoulder not performed on right secondary to recent surgery 4+   Shoulder extension     Shoulder abduction  3+ 4-  Shoulder adduction      Shoulder extension     Shoulder internal rotation  5   Shoulder external rotation  5   Middle trapezius  4   Lower trapezius  4   Elbow flexion     Elbow extension     Wrist flexion     Wrist extension     Wrist ulnar deviation     Wrist radial deviation     Wrist pronation     Wrist supination     Grip strength      (Blank rows = not tested)   TODAY'S TREATMENT:  Date 08/09/2022: Arm bike: Level 1.5 x 6 minutes (3/3)-PT present to discuss progress Wall slides into abduction for lat stretch 10x, 3 sec hold Red physio ball rolls into lat stretch up wall x10  OH ball tapping with red physioball to fatigue.  Rows/ lat pull downs 2x10 with Blue TB. Weighted red ball rolls 30x CW/CCW Bilat ER with GTB 2x10  STM to L UT, and L lat to reduce muscle tension.    Date 08/09/2022:  Arm bike: Level 1x 6 minutes (3/3)-PT present to discuss progress Wall slides into abduction for lat stretch 10x, 3 sec hold Rows/ lat pull downs 2x10 with GTB. STM to L UT, and L lat to reduce muscle tension.   Trigger Point Dry-Needling  Treatment instructions: Expect mild to moderate muscle soreness. S/S of pneumothorax if dry needled over a lung field, and to seek immediate medical attention should they occur. Patient verbalized understanding of these instructions and education.  Patient Consent Given: Yes Education handout provided: No Muscles treated: L UT  Electrical stimulation performed: No Parameters: N/A Treatment response/outcome: Improved tissue flexibility.   Date: 08/07/22 Arm bike: Level 1x 6 minutes (3/3)-PT present to discuss progress Finger ladder -flexion 5" hold x 10, abduction  x 5- added 1# to wrist Shoulder extension and row with green band: 2x10 ER with red band 2x10, bilat  Supine: overhead flexion with 2# weight, bilat, 2x10 Supine: circles CW and CCW with 2# weight with shoulder facing ceiling 2x10 each Seated flexion and scaption, 1#: 2x10, bilat  Date: 08/07/22 Arm  bike: Level 1x 6 minutes (3/3)-PT present to discuss progress Finger ladder -flexion 5" hold x 10, abduction x 5- added 1# to wrist Shoulder extension and row with green band: 2x10 ER with red band 2x10, bilat  Supine: overhead flexion with 2# weight, bilat, 2x10 Supine: circles CW and CCW with 2# weight with shoulder facing ceiling 2x10 each Seated flexion and scaption, 1#: 2x10, bilat   Date: 08/01/22 Arm bike: Level 1x 6 minutes (3/3)-PT present to discuss progress Finger ladder -flexion 5" hold x 10, abduction x 5 Shoulder extension and row with green band: 2x10 ER with green band 2x10, bilat  Pec stretch at doorway 10 seconds x 5  Overhead flexion with 1# weight, bilat, 2x10 Seated flexion and scaption, 1#: 2x10, bilat  Manual in seated position to assess GH capsule. Pt with anterior translation. Posterior mobs with improvements with OH flexion following.                                                                                                                       PATIENT EDUCATION:  Education details: plan of care Person educated: Patient Education method: Explanation Education comprehension: verbalized understanding  HOME EXERCISE PROGRAM: Access Code: [email or web address] URL: [email or web address] Date: 07/29/2022 Prepared by: Claiborne Billings  Exercises - Supine Shoulder Flexion Overhead with Dowel  - 1 x daily - 7 x weekly - 1 sets - 10 reps - Seated Scapular Retraction  - 1 x daily - 7 x weekly - 1 sets - 10 reps - Supine Scapular Retraction  - 1 x daily - 7 x weekly - 1 sets - 10 reps - Scapular Retraction with Resistance  - 1-2 x daily - 7 x weekly - 2 sets - 10 reps - Shoulder extension with resistance - Neutral  - 1-2 x daily - 7 x weekly - 2 sets - 10 reps - Doorway Pec Stretch at 90 Degrees Abduction  - 3 x daily - 7 x weekly - 3 sets - 10 reps  ASSESSMENT:  CLINICAL IMPRESSION: Pt reports noted improvements since last session. He is now able to put on his  seatbelt, and deodorant easier. Session with focus on parascapular strengthening with improved tolerance noted. Pt demonstrates great forum with prescribed exercises. He requires very minimal cues throughout session. Pt continues to be compliant in HEP for shoulder strength and flexibility. Patient will benefit from skilled PT to address the below impairments and improve overall function.    OBJECTIVE IMPAIRMENTS: decreased ROM, decreased strength, impaired perceived functional ability, and pain.   ACTIVITY LIMITATIONS: carrying, lifting, and reach over head  PARTICIPATION LIMITATIONS: community activity and yard work  PERSONAL FACTORS:  overall good health  are also affecting patient's functional outcome positively.   REHAB POTENTIAL: Good  CLINICAL DECISION MAKING: Stable/uncomplicated  EVALUATION COMPLEXITY: Low   GOALS: Goals reviewed with patient? Yes  SHORT TERM GOALS: Target date: 08/23/2022   The patient will demonstrate knowledge of basic self care strategies and exercises to promote healing   Baseline:  Goal status: MET  2.  The patient will report a 30% improvement in pain levels with functional activities: standing with arm dangling and abducting left shoulder to put on deodorant Baseline: 50% (08/07/22) Goal status: MET  3.  Improved left shoulder flexion and abduction to 125 degrees needed for reaching to eye level Baseline: 130 (08/17/22) Goal status: MET  4.  Improved left shoulder abduction strength to 4-/5 needed for household tasks, assisting wife after her surgery Baseline: 4-/5 Goal status: MET      LONG TERM GOALS: Target date: 09/20/2022  The patient will be independent in a safe self progression of a home exercise program to promote further recovery of function   Baseline:  Goal status: MET   2.  The patient will report a 70% improvement in pain levels with functional activities which are currently difficult including reaching overhead, walking  with arm dangling by side and putting on deodorant Baseline:  Goal status: INITIAL  3.  Left shoulder flex and abduction to 145 needed for reaching Baseline:  Goal status: MET   4.  Left shoulder strength grossly 4 to 4+/5 needed for lifting/carrying light to medium objects Baseline:  Goal status: INITIAL  5.  The patient will have improved FOTO score to  65%    indicating improved function with less pain  Baseline:  Goal status: INITIAL    PLAN:  PT FREQUENCY: 1-2x/week  PT DURATION: 8 weeks  PLANNED INTERVENTIONS: Therapeutic exercises, Therapeutic activity, Neuromuscular re-education, Gait training, Patient/Family education, Self Care, Joint mobilization, Dry Needling, Electrical stimulation, Spinal mobilization, Cryotherapy, Moist heat, Taping, Traction, Ultrasound, Ionotophoresis 18m/ml Dexamethasone, Manual therapy, and Re-evaluation  PLAN FOR NEXT SESSION: Lt shoulder ROM, strength and flexibility  SRudi HeapPT, DPT 08/12/22  9:26 AM

## 2022-08-14 ENCOUNTER — Ambulatory Visit: Payer: PPO

## 2022-08-14 DIAGNOSIS — M542 Cervicalgia: Secondary | ICD-10-CM

## 2022-08-14 DIAGNOSIS — H52203 Unspecified astigmatism, bilateral: Secondary | ICD-10-CM | POA: Diagnosis not present

## 2022-08-14 DIAGNOSIS — M6281 Muscle weakness (generalized): Secondary | ICD-10-CM

## 2022-08-14 DIAGNOSIS — H2513 Age-related nuclear cataract, bilateral: Secondary | ICD-10-CM | POA: Diagnosis not present

## 2022-08-14 DIAGNOSIS — M5412 Radiculopathy, cervical region: Secondary | ICD-10-CM

## 2022-08-14 NOTE — Therapy (Signed)
OUTPATIENT PHYSICAL THERAPY TREATMENT   Patient Name: Edward Berger MRN: [id number] DOB:08/11/1946, 76 y.o., male Today's Date: 08/14/2022   PT End of Session - 08/14/22 1010     Visit Number 7    Date for PT Re-Evaluation 09/20/22    Authorization Type [employer] 10th visit prog note    Progress Note Due on Visit 10    PT Start Time 0931    PT Stop Time 1010    PT Time Calculation (min) 39 min    Activity Tolerance Patient tolerated treatment well    Behavior During Therapy WFL for tasks assessed/performed                Past Medical History:  Diagnosis Date   Blood transfusion without reported diagnosis    years ago with surgery    Cervical radiculopathy, chronic    Past Surgical History:  Procedure Laterality Date   ANKLE SURGERY     right   COLONOSCOPY     ENT     nasal polyp removal    SHOULDER SURGERY     left   Patient Active Problem List   Diagnosis Date Noted   Achilles tendon disorder, 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   PERTINENT HISTORY:  07/12/22 cervical lami C7-T1 (posterior approach) 20# lift limit, no Rt UE overhead;  MD follow up in 1 month  PAIN:  PAIN:  Are you having pain? Yes No pain today NPRS scale: 2/10 max pain reported with stretching shoulder  Pain location: left shoulder  Aggravating factors: stand with dangling left arm, abduction of left shoulder to put on  deodorant Relieving factors: support arm rest   PRECAUTIONS: None  WEIGHT BEARING RESTRICTIONS: No  FALLS:  Has patient fallen in last 6 months? No   LIVING ENVIRONMENT: Lives with: lives with their spouse Lives in: House/apartment Stairs: Yes: Internal: 15 steps; on right going up and External: 4 steps; on left going up Has following equipment at home: None    OCCUPATION:  retired  PLOF: Independent Independent and Leisure: reading, exercising, travelng    PATIENT GOALS: get ROM back in shoulder in left   OBJECTIVE:   DIAGNOSTIC FINDINGS:  Prior to surgery  PATIENT SURVEYS:  FOTO 50  COGNITION: Overall cognitive status: Within functional limits for tasks assessed  SENSATION: WFL  POSTURE: No Significant postural limitations   CERVICAL ROM: grossly 25% limited secondary to recent surgery  No tender points in shoulder musculature  UPPER EXTREMITY ROM: Compensatory upper trap overuse;  left scapular winging noted with single arm push up on wall, mild reduction in left pectoral length;   Some increased neural tension on left   Full passive ROM of left shoulder, no pain  Active ROM Right eval Left eval Left 08/07/22  Shoulder flexion 148 118 130  Shoulder extension     Shoulder abduction 155 110 110  Shoulder adduction     Shoulder extension     Shoulder internal rotation 85 82   Shoulder external rotation T6 T8 T4  Elbow flexion     Elbow extension     Wrist flexion     Wrist extension     Wrist ulnar deviation     Wrist radial deviation     Wrist pronation     Wrist supination      (Blank rows = not tested)   UPPER EXTREMITY MMT:  decreased motor control noted with a sidelying shoulder lowering from abducted position Distal strength intact at the elbow, wrist and hand  MMT Right eval Left eval Left  08/07/22  Shoulder flexion MMT of shoulder not performed on right secondary to recent surgery 4+   Shoulder extension     Shoulder abduction   3+ 4-  Shoulder adduction     Shoulder extension     Shoulder internal rotation  5   Shoulder external rotation  5   Middle trapezius  4   Lower trapezius  4   Elbow flexion     Elbow extension     Wrist flexion     Wrist extension     Wrist ulnar deviation     Wrist radial deviation     Wrist pronation     Wrist supination     Grip strength      (Blank rows = not tested)   TODAY'S TREATMENT:  Date: 08/14/22 Arm bike: Level 1.5 x 6 minutes (3/3)- PT present to discuss progress Review of all HEP using visuals and PT instructed pt how to break down exercises to reduce time spent each day Wall ladder: 1# flexion, abduction 1# x10 Sidelying abduction 2# 2x10 Sidelying abduction: x10, 1# added x10  Ball rolls seated: forward and bil lateral x 10 each 3 way scapular stabilization on wall: green loop around wrists x 5 Rt and Lt  Date 08/12/2022:  Arm bike: Level 1x 6 minutes (3/3)-PT present to discuss progress Wall slides into abduction for lat stretch 10x, 3 sec hold Rows/ lat pull downs 2x10 with GTB. STM to L UT, and L lat to reduce muscle tension.   Trigger Point Dry-Needling  Treatment instructions: Expect mild to moderate muscle soreness. S/S of pneumothorax if dry needled over a lung field, and to seek immediate medical attention should they occur. Patient verbalized understanding of these instructions and education.  Patient Consent Given: Yes Education handout provided: No Muscles treated: Lt UT  Electrical stimulation performed: No Parameters: N/A Treatment response/outcome: Improved tissue flexibility.   Date: 08/09/22 Arm bike: Level 1x 6  minutes (3/3)-PT present to discuss progress Finger ladder -flexion 5" hold x 10, abduction x 5- added 1# to wrist Shoulder extension and row with green band: 2x10 ER with red band 2x10, bilat  Supine: overhead flexion with 2# weight, bilat, 2x10 Supine: circles CW and CCW with 2# weight with shoulder facing ceiling 2x10  each Seated flexion and scaption, 1#: 2x10, bilat                                                                                                                      PATIENT EDUCATION:  Education details: plan of care Person educated: Patient Education method: Explanation Education comprehension: verbalized understanding  HOME EXERCISE PROGRAM: Access Code: [email or web address] URL: [email or web address] Date: 07/29/2022 Prepared by: Claiborne Billings  Exercises - Supine Shoulder Flexion Overhead with Dowel  - 1 x daily - 7 x weekly - 1 sets - 10 reps - Seated Scapular Retraction  - 1 x daily - 7 x weekly - 1 sets - 10 reps - Supine Scapular Retraction  - 1 x daily - 7 x weekly - 1 sets - 10 reps - Scapular Retraction with Resistance  - 1-2 x daily - 7 x weekly - 2 sets - 10 reps - Shoulder extension with resistance - Neutral  - 1-2 x daily - 7 x weekly - 2 sets - 10 reps - Doorway Pec Stretch at 90 Degrees Abduction  - 3 x daily - 7 x weekly - 3 sets - 10 reps  ASSESSMENT:  CLINICAL IMPRESSION: Pt reports 75% overall improvement in Lt shoulder pain since the start of care.  He is now able to put on his seatbelt, and deodorant with greater ease. Session with focus on parascapular strengthening with improved tolerance noted. PT spent time reviewing HEP with pt and discussing how to break up the amount of exercises to shorten the time doing them each day.  Pt continues to be compliant in HEP for shoulder strength and flexibility. Patient will benefit from skilled PT to address the below impairments and improve overall function.    OBJECTIVE IMPAIRMENTS: decreased ROM, decreased strength, impaired perceived functional ability, and pain.   ACTIVITY LIMITATIONS: carrying, lifting, and reach over head  PARTICIPATION LIMITATIONS: community activity and yard work  PERSONAL FACTORS:  overall good health  are also affecting patient's functional outcome positively.   REHAB POTENTIAL:  Good  CLINICAL DECISION MAKING: Stable/uncomplicated  EVALUATION COMPLEXITY: Low   GOALS: Goals reviewed with patient? Yes  SHORT TERM GOALS: Target date: 08/23/2022   The patient will demonstrate knowledge of basic self care strategies and exercises to promote healing   Baseline:  Goal status: MET  2.  The patient will report a 30% improvement in pain levels with functional activities: standing with arm dangling and abducting left shoulder to put on deodorant Baseline: 50% (08/07/22) Goal status: MET  3.  Improved left shoulder flexion and abduction to 125 degrees needed for reaching to eye level Baseline: 130 (08/17/22) Goal status: MET  4.  Improved left shoulder abduction strength to 4-/5 needed for household tasks, assisting wife after her surgery Baseline: 4-/5 Goal status: MET      LONG TERM GOALS: Target date: 09/20/2022  The patient will be independent in a safe self progression of a home exercise program to promote further recovery of function   Baseline:  Goal status: MET   2.  The patient will report a 70% improvement in pain levels with functional activities which are currently difficult including reaching overhead, walking with arm dangling by side and putting on deodorant Baseline: 75% (08/14/22) Goal status: MET  3.  Left shoulder flex and abduction to 145 needed for reaching Baseline:  Goal status: MET   4.  Left shoulder strength grossly 4 to 4+/5 needed for lifting/carrying light to medium objects Baseline:  Goal status: INITIAL  5.  The patient will have improved FOTO score to  65%    indicating improved function with less pain  Baseline:  Goal status: INITIAL    PLAN:  PT FREQUENCY: 1-2x/week  PT DURATION: 8 weeks  PLANNED INTERVENTIONS: Therapeutic exercises, Therapeutic activity, Neuromuscular re-education, Gait training, Patient/Family education, Self Care, Joint mobilization, Dry Needling, Electrical stimulation, Spinal  mobilization, Cryotherapy, Moist heat, Taping, Traction, Ultrasound, Ionotophoresis 75m/ml Dexamethasone, Manual therapy, and Re-evaluation  PLAN FOR NEXT SESSION: Lt shoulder ROM, strength and flexibility  KSigurd Sos PT 08/14/22 10:11 AM

## 2022-08-20 ENCOUNTER — Ambulatory Visit: Payer: PPO | Admitting: Physical Therapy

## 2022-08-20 DIAGNOSIS — M6281 Muscle weakness (generalized): Secondary | ICD-10-CM | POA: Diagnosis not present

## 2022-08-20 DIAGNOSIS — M5412 Radiculopathy, cervical region: Secondary | ICD-10-CM

## 2022-08-20 DIAGNOSIS — M542 Cervicalgia: Secondary | ICD-10-CM

## 2022-08-20 NOTE — Therapy (Signed)
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 He plans to come for one more session.   PERTINENT HISTORY:  07/12/22 cervical lami C7-T1 (posterior approach) 20# lift limit, no Rt UE overhead;  MD follow up in 1 month  PAIN:  PAIN:  Are you having pain? Yes No pain today NPRS scale: 2/10 max pain reported with stretching shoulder  Pain location: left shoulder  Aggravating factors: stand with dangling left arm, abduction of left shoulder to put on deodorant Relieving factors: support arm rest   PRECAUTIONS: None  WEIGHT BEARING RESTRICTIONS: No  FALLS:  Has patient fallen in last 6 months? No   LIVING ENVIRONMENT: Lives with: lives with their spouse Lives in: House/apartment Stairs: Yes: Internal: 15 steps; on right going up and External: 4  steps; on left going up Has following equipment at home: None    OCCUPATION: retired  PLOF: Independent Independent and Leisure: reading, exercising, travelng    PATIENT GOALS: get ROM back in shoulder in left   OBJECTIVE:   DIAGNOSTIC FINDINGS:  Prior to surgery  PATIENT SURVEYS:  FOTO 50  COGNITION: Overall cognitive status: Within functional limits for tasks assessed  SENSATION: WFL  POSTURE: No Significant postural limitations   CERVICAL ROM: grossly 25% limited secondary to recent surgery  No tender points in shoulder musculature  UPPER EXTREMITY ROM: Compensatory upper trap overuse;  left scapular winging noted with single arm push up on wall, mild reduction in left pectoral length;   Some increased neural tension on left   Full passive ROM of left shoulder, no pain  Active ROM Right eval Left eval Left 08/07/22 Left 08/20/2022   Shoulder flexion 148 118 130 145  Shoulder extension      Shoulder abduction 155 110 110 148  Shoulder adduction      Shoulder extension      Shoulder internal rotation 85 82  C8  Shoulder external rotation T6 T8 T4 T6  Elbow flexion      Elbow extension      Wrist flexion      Wrist extension      Wrist ulnar deviation      Wrist radial deviation      Wrist pronation      Wrist supination       (Blank rows = not tested)   UPPER EXTREMITY MMT:  decreased motor control noted with a sidelying shoulder lowering from abducted position Distal strength intact at the elbow, wrist and hand  MMT Right eval Left eval Left  08/07/22 Left  08/20/2022  Shoulder flexion MMT of shoulder not performed on right secondary to recent surgery 4+  5/5  Shoulder extension      Shoulder abduction  3+ 4- 4+  Shoulder adduction      Shoulder extension      Shoulder internal rotation  5    Shoulder external rotation  5    Middle trapezius  4  4+  Lower trapezius  4  4  Elbow flexion      Elbow extension      Wrist flexion      Wrist  extension      Wrist ulnar deviation      Wrist radial deviation      Wrist pronation      Wrist supination      Grip strength       (Blank rows = not tested)  TODAY'S TREATMENT:  Date: 08/20/22 Arm bike: Level 1.5 x 6 minutes (3/3)- PT present to discuss progress Wall ladder: 1# flexion, abduction 1# x10 Rows/ lat pull downs 2x10 with GTB. Lat pull down 40# 2x10  Wall taps OH with green physioball to fatigue Wall rolls for lat stretch 10x with physioball 3 way scapular stabilization on wall: green loop around wrists x 5 Rt and Lt  TODAY'S TREATMENT:  Date: 08/14/22 Arm bike: Level 1.5 x 6 minutes (3/3)- PT present to discuss progress Review of all HEP using visuals and PT instructed pt how to break down exercises to reduce time spent each day Wall ladder: 1# flexion, abduction 1# x10 Sidelying abduction 2# 2x10 Sidelying abduction: x10, 1# added x10  Ball rolls seated: forward and bil lateral x 10 each 3 way scapular stabilization on wall: green loop around wrists x 5 Rt and Lt  Date 08/12/2022:  Arm bike: Level 1x 6 minutes (3/3)-PT present to discuss progress Wall slides into abduction for lat stretch 10x, 3 sec hold Rows/ lat pull downs 2x10 with GTB. STM to L UT, and L lat to reduce muscle tension.   Trigger Point Dry-Needling  Treatment instructions: Expect mild to moderate muscle soreness. S/S of pneumothorax if dry needled over a lung field, and to seek immediate medical attention should they occur. Patient verbalized understanding of these instructions and education.  Patient Consent Given:  Yes Education handout provided: No Muscles treated: Lt UT  Electrical stimulation performed: No Parameters: N/A Treatment response/outcome: Improved tissue flexibility.   Date: 08/09/22 Arm bike: Level 1x 6 minutes (3/3)-PT present to discuss progress Finger ladder -flexion 5" hold x 10, abduction x 5- added 1# to wrist Shoulder extension and row with green band: 2x10 ER  with red band 2x10, bilat  Supine: overhead flexion with 2# weight, bilat, 2x10 Supine: circles CW and CCW with 2# weight with shoulder facing ceiling 2x10 each Seated flexion and scaption, 1#: 2x10, bilat                                                                                                                      PATIENT EDUCATION:  Education details: plan of care Person educated: Patient Education method: Explanation Education comprehension: verbalized understanding  HOME EXERCISE PROGRAM: Access Code: [email or web address] URL: [email or web address] Date: 07/29/2022 Prepared by: Claiborne Billings  Exercises - Supine Shoulder Flexion Overhead with Dowel  - 1 x daily - 7 x weekly - 1 sets - 10 reps - Seated Scapular Retraction  - 1 x daily - 7 x weekly - 1 sets - 10 reps - Supine Scapular Retraction  - 1 x daily - 7 x weekly - 1 sets - 10 reps - Scapular Retraction with Resistance  - 1-2 x daily - 7 x weekly - 2 sets - 10 reps - Shoulder extension with resistance - Neutral  - 1-2 x daily - 7 x weekly - 2 sets - 10 reps - Doorway Pec Stretch at 90 Degrees Abduction  - 3 x daily - 7 x weekly - 3 sets - 10 reps  ASSESSMENT:  CLINICAL IMPRESSION: Pt continues to show improvements as noted with his ROM and MMT's today. He demonstrates good control with exercises and has no difficulty at end range movements. Pt plans to discharge after next session.    OBJECTIVE IMPAIRMENTS: decreased ROM, decreased strength, impaired perceived functional ability, and pain.   ACTIVITY LIMITATIONS: carrying, lifting, and reach over head  PARTICIPATION LIMITATIONS: community activity and yard work  PERSONAL FACTORS:  overall good health  are also affecting patient's functional outcome positively.   REHAB POTENTIAL: Good  CLINICAL DECISION MAKING: Stable/uncomplicated  EVALUATION COMPLEXITY: Low   GOALS: Goals reviewed with patient? Yes  SHORT TERM GOALS: Target date: 08/23/2022   The patient  will demonstrate knowledge of basic self care strategies and exercises to promote healing   Baseline:  Goal status: MET  2.  The patient will report a 30% improvement in pain levels with functional activities: standing with arm dangling and abducting left shoulder to put on deodorant Baseline: 50% (08/07/22) Goal status: MET  3.  Improved left shoulder flexion and abduction to 125 degrees needed for reaching to eye level Baseline: 130 (08/17/22) Goal status: MET  4.  Improved left shoulder abduction strength to 4-/5 needed for household tasks, assisting wife after her surgery Baseline: 4-/5 Goal status: MET      LONG  TERM GOALS: Target date: 09/20/2022  The patient will be independent in a safe self progression of a home exercise program to promote further recovery of function   Baseline:  Goal status: MET   2.  The patient will report a 70% improvement in pain levels with functional activities which are currently difficult including reaching overhead, walking with arm dangling by side and putting on deodorant Baseline: 75% (08/14/22) Goal status: MET  3.  Left shoulder flex and abduction to 145 needed for reaching Baseline:  Goal status: MET   4.  Left shoulder strength grossly 4 to 4+/5 needed for lifting/carrying light to medium objects Baseline:  Goal status: INITIAL  5.  The patient will have improved FOTO score to  65%    indicating improved function with less pain  Baseline:  Goal status: INITIAL    PLAN:  PT FREQUENCY: 1-2x/week  PT DURATION: 8 weeks  PLANNED INTERVENTIONS: Therapeutic exercises, Therapeutic activity, Neuromuscular re-education, Gait training, Patient/Family education, Self Care, Joint mobilization, Dry Needling, Electrical stimulation, Spinal mobilization, Cryotherapy, Moist heat, Taping, Traction, Ultrasound, Ionotophoresis 69m/ml Dexamethasone, Manual therapy, and Re-evaluation  PLAN FOR NEXT SESSION: D/C to comprehensive HEP   SRudi HeapPT, DPT 08/20/22  12:27 PM

## 2022-08-27 ENCOUNTER — Ambulatory Visit: Payer: PPO

## 2022-08-27 DIAGNOSIS — M6281 Muscle weakness (generalized): Secondary | ICD-10-CM

## 2022-08-27 DIAGNOSIS — M542 Cervicalgia: Secondary | ICD-10-CM

## 2022-08-27 DIAGNOSIS — M5412 Radiculopathy, cervical region: Secondary | ICD-10-CM

## 2022-08-27 NOTE — Therapy (Signed)
OUTPATIENT PHYSICAL THERAPY TREATMENT   Patient Name: Edward Berger MRN: [id number] DOB:[birth year], 76 y.o., male Today's Date: 08/27/2022    PT End of Session - 08/27/22 1301     Visit Number 9    PT Start Time 1232    PT Stop Time 1300    PT Time Calculation (min) 28 min    Activity Tolerance Patient tolerated treatment well    Behavior During Therapy WFL for tasks assessed/performed              Past Medical History:  Diagnosis Date   Blood transfusion without reported diagnosis    years ago with surgery    Cervical radiculopathy, chronic    Past Surgical History:  Procedure Laterality Date   ANKLE SURGERY     right   COLONOSCOPY     ENT     nasal polyp removal    SHOULDER SURGERY     left   Patient Active Problem List   Diagnosis Date Noted   Achilles tendon disorder, 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   NECK PAIN 10/27/2008    PCP: Isaac Bliss, Olam Idler MD  REFERRING PROVIDER: Eustace Moore MD  REFERRING DIAG: M54.12 radiculopathy, cervical region  THERAPY DIAG:  Neck pain, weakness Rationale for Evaluation and Treatment: Rehabilitation  ONSET DATE: 07/12/22  SUBJECTIVE:                                                                                                                                                                                                         SUBJECTIVE STATEMENT: 90% overall improvement since the start of care.  Not limited in use of Lt UE.  PERTINENT HISTORY:  07/12/22 cervical lami C7-T1 (posterior approach) 20# lift limit, no Rt UE overhead;  MD follow up in 1 month  PAIN:  PAIN:  Are you having pain? Yes No pain today NPRS scale: 2/10 max pain reported with stretching shoulder  Pain location: left shoulder  Aggravating factors: stand with dangling left arm, abduction of left shoulder to put on deodorant Relieving factors: support arm rest   PRECAUTIONS: None  WEIGHT BEARING RESTRICTIONS: No  FALLS:  Has patient  fallen in last 6 months? No   LIVING ENVIRONMENT: Lives with: lives with their spouse Lives in: House/apartment Stairs: Yes: Internal: 15 steps; on right going up and External: 4 steps; on left going up Has following equipment at home: None    OCCUPATION: retired  PLOF: Independent Independent and Leisure: reading, exercising, travelng    PATIENT GOALS: get ROM back in shoulder in left  OBJECTIVE:   DIAGNOSTIC FINDINGS:  Prior to surgery  PATIENT SURVEYS:  FOTO 50 08/27/22: 74   COGNITION: Overall cognitive status: Within functional limits for tasks assessed  SENSATION: WFL  POSTURE: No Significant postural limitations   CERVICAL ROM: grossly 25% limited secondary to recent surgery  No tender points in shoulder musculature  UPPER EXTREMITY ROM: Compensatory upper trap overuse;  left scapular winging noted with single arm push up on wall, mild reduction in left pectoral length;   Some increased neural tension on left   Full passive ROM of left shoulder, no pain  Active ROM Right eval Left eval Left 08/07/22 Left 08/20/2022   Shoulder flexion 148 118 130 145  Shoulder extension      Shoulder abduction 155 110 110 148  Shoulder adduction      Shoulder extension      Shoulder internal rotation 85 82  C8  Shoulder external rotation T6 T8 T4 T6  Elbow flexion      Elbow extension      Wrist flexion      Wrist extension      Wrist ulnar deviation      Wrist radial deviation      Wrist pronation      Wrist supination       (Blank rows = not tested)   UPPER EXTREMITY MMT:  decreased motor control noted with a sidelying shoulder lowering from abducted position Distal strength intact at the elbow, wrist and hand  MMT Right eval Left eval Left  08/07/22 Left  08/20/2022  Shoulder flexion MMT of shoulder not performed on right secondary to recent surgery 4+  5/5  Shoulder extension      Shoulder abduction  3+ 4- 4+  Shoulder adduction      Shoulder  extension      Shoulder internal rotation  5    Shoulder external rotation  5    Middle trapezius  4  4+  Lower trapezius  4  4  Elbow flexion      Elbow extension      Wrist flexion      Wrist extension      Wrist ulnar deviation      Wrist radial deviation      Wrist pronation      Wrist supination      Grip strength       (Blank rows = not tested)  TODAY'S TREATMENT:  Date: 08/27/22 Arm bike: Level 1.5 x 6 minutes (3/3)- PT present to discuss progress Wall ladder: abduction 1# 2x10 Lat pull down 40# 2x10  Wall taps OH with green physioball to fatigue Wall rolls for lat stretch 10x with physioball 3 way scapular stabilization on wall: green loop around wrists x 5 Rt and Lt Verbal review of all HEP with pt  Date: 08/20/22 Arm bike: Level 1.5 x 6 minutes (3/3)- PT present to discuss progress Wall ladder: 1# flexion, abduction 1# x10 Rows/ lat pull downs 2x10 with GTB. Lat pull down 40# 2x10  Wall taps OH with green physioball to fatigue Wall rolls for lat stretch 10x with physioball 3 way scapular stabilization on wall: green loop around wrists x 5 Rt and Lt  TODAY'S TREATMENT:  Date: 08/14/22 Arm bike: Level 1.5 x 6 minutes (3/3)- PT present to discuss progress Review of all HEP using visuals and PT instructed pt how to break down exercises to reduce time spent each day Wall ladder: 1# flexion, abduction 1# x10 Sidelying abduction 2#  2x10 Sidelying abduction: x10, 1# added x10  Ball rolls seated: forward and bil lateral x 10 each 3 way scapular stabilization on wall: green loop around wrists x 5 Rt and Lt  PATIENT EDUCATION:  Education details: plan of care Person educated: Patient Education method: Explanation Education comprehension: verbalized understanding  HOME EXERCISE PROGRAM: Access Code: [email or web address] URL: [email or web address] Date: 07/29/2022 Prepared by: Claiborne Billings  Exercises - Supine Shoulder Flexion Overhead with Dowel  - 1 x daily - 7 x  weekly - 1 sets - 10 reps - Seated Scapular Retraction  - 1 x daily - 7 x weekly - 1 sets - 10 reps - Supine Scapular Retraction  - 1 x daily - 7 x weekly - 1 sets - 10 reps - Scapular Retraction with Resistance  - 1-2 x daily - 7 x weekly - 2 sets - 10 reps - Shoulder extension with resistance - Neutral  - 1-2 x daily - 7 x weekly - 2 sets - 10 reps - Doorway Pec Stretch at 90 Degrees Abduction  - 3 x daily - 7 x weekly - 3 sets - 10 reps  ASSESSMENT:  CLINICAL IMPRESSION: Pt reports 90% overall improvement in Lt UE symptoms since the start of care.  All goals have been met and pt has HEP to continue to work on postural strength.  Pt will follow-up with MD as needed.     OBJECTIVE IMPAIRMENTS: decreased ROM, decreased strength, impaired perceived functional ability, and pain.   ACTIVITY LIMITATIONS: carrying, lifting, and reach over head  PARTICIPATION LIMITATIONS: community activity and yard work  PERSONAL FACTORS:  overall good health  are also affecting patient's functional outcome positively.   REHAB POTENTIAL: Good  CLINICAL DECISION MAKING: Stable/uncomplicated  EVALUATION COMPLEXITY: Low   GOALS: Goals reviewed with patient? Yes  SHORT TERM GOALS: Target date: 08/23/2022   The patient will demonstrate knowledge of basic self care strategies and exercises to promote healing   Baseline:  Goal status: MET  2.  The patient will report a 30% improvement in pain levels with functional activities: standing with arm dangling and abducting left shoulder to put on deodorant Baseline: 70 (08/27/22) Goal status: MET  3.  Improved left shoulder flexion and abduction to 125 degrees needed for reaching to eye level Baseline: 130 (08/17/22) Goal status: MET  4.  Improved left shoulder abduction strength to 4-/5 needed for household tasks, assisting wife after her surgery Baseline: 4-/5 Goal status: MET      LONG TERM GOALS: Target date: 09/20/2022  The patient will be  independent in a safe self progression of a home exercise program to promote further recovery of function   Baseline:  Goal status: MET   2.  The patient will report a 70% improvement in pain levels with functional activities which are currently difficult including reaching overhead, walking with arm dangling by side and putting on deodorant Baseline: 75% (08/27/22) Goal status: MET  3.  Left shoulder flex and abduction to 145 needed for reaching Baseline:  Goal status: MET   4.  Left shoulder strength grossly 4 to 4+/5 needed for lifting/carrying light to medium objects Baseline: see above  Goal status: met  5.  The patient will have improved FOTO score to  65%    indicating improved function with less pain  Baseline: 74 (08/27/22) Goal status: MET    PLAN: D/C PT to HEP PHYSICAL THERAPY DISCHARGE SUMMARY  Visits from Start of Care: 9  Current functional level related to  goals / functional outcomes: See above for current status.  Pt has met all goals    Remaining deficits: No functional deficits remain at this time.    Education / Equipment: HEP, postural education   Patient agrees to discharge. Patient goals were met. Patient is being discharged due to meeting the stated rehab goals.  Sigurd Sos, PT 08/27/22 1:02 PM

## 2022-08-29 DIAGNOSIS — D2272 Melanocytic nevi of left lower limb, including hip: Secondary | ICD-10-CM | POA: Diagnosis not present

## 2022-08-29 DIAGNOSIS — D235 Other benign neoplasm of skin of trunk: Secondary | ICD-10-CM | POA: Diagnosis not present

## 2022-08-29 DIAGNOSIS — L570 Actinic keratosis: Secondary | ICD-10-CM | POA: Diagnosis not present

## 2022-08-29 DIAGNOSIS — D2271 Melanocytic nevi of right lower limb, including hip: Secondary | ICD-10-CM | POA: Diagnosis not present

## 2022-08-29 DIAGNOSIS — Z85828 Personal history of other malignant neoplasm of skin: Secondary | ICD-10-CM | POA: Diagnosis not present

## 2022-08-29 DIAGNOSIS — D2371 Other benign neoplasm of skin of right lower limb, including hip: Secondary | ICD-10-CM | POA: Diagnosis not present

## 2022-08-29 DIAGNOSIS — L821 Other seborrheic keratosis: Secondary | ICD-10-CM | POA: Diagnosis not present

## 2022-08-29 DIAGNOSIS — B353 Tinea pedis: Secondary | ICD-10-CM | POA: Diagnosis not present

## 2022-08-29 DIAGNOSIS — L578 Other skin changes due to chronic exposure to nonionizing radiation: Secondary | ICD-10-CM | POA: Diagnosis not present

## 2022-08-29 DIAGNOSIS — D225 Melanocytic nevi of trunk: Secondary | ICD-10-CM | POA: Diagnosis not present

## 2022-08-29 DIAGNOSIS — D485 Neoplasm of uncertain behavior of skin: Secondary | ICD-10-CM | POA: Diagnosis not present

## 2022-09-17 DIAGNOSIS — M5416 Radiculopathy, lumbar region: Secondary | ICD-10-CM | POA: Diagnosis not present

## 2022-09-25 DIAGNOSIS — L03012 Cellulitis of left finger: Secondary | ICD-10-CM | POA: Diagnosis not present

## 2022-09-26 DIAGNOSIS — L570 Actinic keratosis: Secondary | ICD-10-CM | POA: Diagnosis not present

## 2022-09-26 DIAGNOSIS — L03012 Cellulitis of left finger: Secondary | ICD-10-CM | POA: Diagnosis not present

## 2022-09-26 DIAGNOSIS — L089 Local infection of the skin and subcutaneous tissue, unspecified: Secondary | ICD-10-CM | POA: Diagnosis not present

## 2022-09-26 DIAGNOSIS — M4690 Unspecified inflammatory spondylopathy, site unspecified: Secondary | ICD-10-CM | POA: Diagnosis not present

## 2022-10-01 DIAGNOSIS — L03012 Cellulitis of left finger: Secondary | ICD-10-CM | POA: Diagnosis not present

## 2022-11-21 ENCOUNTER — Encounter: Payer: Self-pay | Admitting: Internal Medicine

## 2022-11-21 ENCOUNTER — Ambulatory Visit (INDEPENDENT_AMBULATORY_CARE_PROVIDER_SITE_OTHER): Payer: PPO | Admitting: Internal Medicine

## 2022-11-21 VITALS — BP 130/78 | HR 55 | Temp 97.7°F | Ht 67.5 in | Wt 153.9 lb

## 2022-11-21 DIAGNOSIS — Z125 Encounter for screening for malignant neoplasm of prostate: Secondary | ICD-10-CM

## 2022-11-21 DIAGNOSIS — Z0000 Encounter for general adult medical examination without abnormal findings: Secondary | ICD-10-CM | POA: Diagnosis not present

## 2022-11-21 DIAGNOSIS — R739 Hyperglycemia, unspecified: Secondary | ICD-10-CM | POA: Diagnosis not present

## 2022-11-21 DIAGNOSIS — E038 Other specified hypothyroidism: Secondary | ICD-10-CM

## 2022-11-21 DIAGNOSIS — Z1159 Encounter for screening for other viral diseases: Secondary | ICD-10-CM | POA: Diagnosis not present

## 2022-11-21 LAB — TSH: TSH: 1.18 u[IU]/mL (ref 0.35–5.50)

## 2022-11-21 LAB — COMPREHENSIVE METABOLIC PANEL
ALT: 15 U/L (ref 0–53)
AST: 17 U/L (ref 0–37)
Albumin: 4.3 g/dL (ref 3.5–5.2)
Alkaline Phosphatase: 60 U/L (ref 39–117)
BUN: 22 mg/dL (ref 6–23)
CO2: 33 mEq/L — ABNORMAL HIGH (ref 19–32)
Calcium: 9.7 mg/dL (ref 8.4–10.5)
Chloride: 103 mEq/L (ref 96–112)
Creatinine, Ser: 1.15 mg/dL (ref 0.40–1.50)
GFR: 61.62 mL/min (ref >60.00)
Glucose, Bld: 95 mg/dL (ref 70–99)
Potassium: 5 mEq/L (ref 3.5–5.1)
Sodium: 143 mEq/L (ref 135–145)
Total Bilirubin: 0.7 mg/dL (ref 0.2–1.2)
Total Protein: 6.6 g/dL (ref 6.0–8.3)

## 2022-11-21 LAB — CBC WITH DIFFERENTIAL/PLATELET
Basophils Absolute: 0 10*3/uL (ref 0.0–0.1)
Basophils Relative: 0.4 % (ref 0.0–3.0)
Eosinophils Absolute: 0.2 10*3/uL (ref 0.0–0.7)
Eosinophils Relative: 3 % (ref 0.0–5.0)
HCT: 44.3 % (ref 39.0–52.0)
Hemoglobin: 14.9 g/dL (ref 13.0–17.0)
Lymphocytes Relative: 24.3 % (ref 12.0–46.0)
Lymphs Abs: 1.4 10*3/uL (ref 0.7–4.0)
MCHC: 33.6 g/dL (ref 30.0–36.0)
MCV: 94.6 fl (ref 78.0–100.0)
Monocytes Absolute: 0.7 10*3/uL (ref 0.1–1.0)
Monocytes Relative: 11.5 % (ref 3.0–12.0)
Neutro Abs: 3.5 10*3/uL (ref 1.4–7.7)
Neutrophils Relative %: 60.8 % (ref 43.0–77.0)
Platelets: 195 10*3/uL (ref 150.0–400.0)
RBC: 4.68 Mil/uL (ref 4.22–5.81)
RDW: 13.5 % (ref 11.5–15.5)
WBC: 5.7 10*3/uL (ref 4.0–10.5)

## 2022-11-21 LAB — LIPID PANEL
Cholesterol: 243 mg/dL — ABNORMAL HIGH (ref 0–200)
HDL: 73.6 mg/dL (ref >39.00)
LDL Cholesterol: 154 mg/dL — ABNORMAL HIGH (ref 0–99)
NonHDL: 169.53
Total CHOL/HDL Ratio: 3
Triglycerides: 76 mg/dL (ref 0.0–149.0)
VLDL: 15.2 mg/dL (ref 0.0–40.0)

## 2022-11-21 LAB — PSA: PSA: 0.34 ng/mL (ref 0.10–4.00)

## 2022-11-21 NOTE — Progress Notes (Signed)
Established Patient Office Visit     CC/Reason for Visit: Annual preventive exam and subsequent Medicare wellness visit  HPI: Edward Berger is a 77 y.o. male who is coming in today for the above mentioned reasons. 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  He wears hearing aids, has routine eye and dental care.  He is overdue for Tdap, COVID, RSV vaccines.  It appears he is due for colonoscopy this year.   Past Medical/Surgical History: Past Medical History:  Diagnosis Date   Blood transfusion without reported diagnosis    years ago with surgery    Cervical radiculopathy, chronic     Past Surgical History:  Procedure Laterality Date   ANKLE SURGERY     right   COLONOSCOPY     ENT     nasal polyp removal    SHOULDER SURGERY     left    Social History:  reports that he has never smoked. He has never used smokeless tobacco. He reports current alcohol use. He reports that he does not use drugs.  Allergies: No Known Allergies  Family History:  Family History  Problem Relation Age of Onset   Heart disease Mother    Cancer Father        prostate   Colon cancer Neg Hx    Colon polyps Neg Hx    Esophageal cancer Neg Hx    Rectal cancer Neg Hx    Stomach cancer Neg Hx      Current Outpatient Medications:    finasteride (PROPECIA) 1 MG tablet, Take 1 tablet (1 mg total) by mouth daily., Disp: 90 tablet, Rfl: 1   hydrocortisone (ANUSOL-HC) 2.5 % rectal cream, Place 1 application rectally 2 (two) times daily., Disp: 30 g, Rfl: 2   ibuprofen (ADVIL,MOTRIN) 200 MG tablet, Take 400 mg by mouth every 6 (six) hours as needed for pain., Disp: , Rfl:    Multiple Vitamin (MULTIVITAMIN) tablet, Take 1 tablet by mouth daily., Disp: , Rfl:    Omega-3 Fatty Acids (FISH OIL) 1000 MG CAPS, Take 1 capsule by mouth daily., Disp: , Rfl:   Review of Systems:  Negative unless indicated in  HPI.   Physical Exam: Vitals:   11/20/22 1640  BP: 130/78  Pulse: (!) 55  Temp: 97.7 F (36.5 C)  TempSrc: Oral  SpO2: 99%  Weight: 153 lb 14.4 oz (69.8 kg)  Height: 5' 7.5" (1.715 m)    Body mass index is 23.75 kg/m.   Physical Exam Vitals reviewed.  Constitutional:      General: He is not in acute distress.    Appearance: Normal appearance. He is not ill-appearing, toxic-appearing or diaphoretic.  HENT:     Head: Normocephalic.     Right Ear: Tympanic membrane, ear canal and external ear normal. There is no impacted cerumen.     Left Ear: Tympanic membrane, ear canal and external ear normal. There is no impacted cerumen.     Nose: Nose normal.     Mouth/Throat:     Mouth: Mucous membranes are moist.     Pharynx: Oropharynx is clear. No oropharyngeal exudate or posterior oropharyngeal erythema.  Eyes:     General: No scleral icterus.       Right eye: No discharge.        Left eye: No discharge.     Conjunctiva/sclera: Conjunctivae normal.     Pupils: Pupils are equal,  round, and reactive to light.  Neck:     Vascular: No carotid bruit.  Cardiovascular:     Rate and Rhythm: Normal rate and regular rhythm.     Pulses: Normal pulses.     Heart sounds: Normal heart sounds.  Pulmonary:     Effort: Pulmonary effort is normal. No respiratory distress.     Breath sounds: Normal breath sounds.  Abdominal:     General: Abdomen is flat. Bowel sounds are normal.     Palpations: Abdomen is soft.  Musculoskeletal:        General: Normal range of motion.     Cervical back: Normal range of motion.  Skin:    General: Skin is warm and dry.  Neurological:     General: No focal deficit present.     Mental Status: He is alert and oriented to person, place, and time. Mental status is at baseline.  Psychiatric:        Mood and Affect: Mood normal.        Behavior: Behavior normal.        Thought Content: Thought content normal.        Judgment: Judgment normal.       Subsequent Medicare wellness visit   1. Risk factors, based on past  M,S,F - Cardiac Risk Factors include: advanced age (>28mn, >>7women)   2.  Physical activities: Dietary issues and exercise activities discussed:  Current Exercise Habits: Home exercise routine, Time (Minutes): 40, Frequency (Times/Week): 5, Weekly Exercise (Minutes/Week): 200, Intensity: Intense   3.  Depression/mood:  [facility]        4.  ADL's:    11/20/2022    4:38 PM 11/20/2022   12:59 PM  In your present state of health, do you have any difficulty performing the following activities:  Hearing? 1 0  Comment wears hearing aids   Vision? 0 0  Difficulty concentrating or making decisions? 0 0  Walking or climbing stairs? 0 0  Dressing or bathing? 0 0  Doing errands, shopping? 0 0  Preparing Food and eating ? N N  Using the Toilet? N N  In the past six months, have you accidently leaked urine? N N  Do you have problems with loss of bowel control? N N  Managing your Medications? N N  Managing your Finances? N N  Housekeeping or managing your Housekeeping? N N     5.  Fall risk:     09/08/2021    9:33 AM 09/12/2021   11:46 AM 09/27/2021   10:01 AM 04/01/2022   11:43 AM 11/20/2022   12:59 PM  Fall Risk  Falls in the past year? 0 0 0 0 0  Was there an injury with Fall?  0  0 0  Fall Risk Category Calculator  0  0 0  Fall Risk Category (Retired)  Low  Low   (RETIRED) Patient Fall Risk Level  Low fall risk Low fall risk Low fall risk   Patient at Risk for Falls Due to    No Fall Risks   Fall risk Follow up  Falls evaluation completed  Falls evaluation completed      6.  Home safety: No problems identified   7.  Height weight, and visual acuity: height and weight as above, vision/hearing: Vision Screening   Right eye Left eye Both eyes  Without correction  With correction 20/25 20/25 20/25 $      8.  Counseling: Counseling given: Not Answered    9. Lab orders based on risk factors: Laboratory update will be reviewed   10. Cognitive assessment:        11/20/2022    4:43 PM  6CIT Screen  What Year? 0 points  What month? 0 points  What time? 0 points  Count back from 20 0 points  Months in reverse 0 points  Repeat phrase 0 points  Total Score 0 points     11. Screening: Patient provided with a written and personalized 5-10 year screening schedule in the AVS. Health Maintenance  Topic Date Due   Hepatitis C Screening: USPSTF Recommendation to screen - Ages 41-79 yo.  Never done   DTaP/Tdap/Td vaccine (2 - Tdap) 10/28/2019   COVID-19 Vaccine (5 - 2023-24 season) 12/06/2022*   Colon Cancer Screening  10/01/2023*   Medicare Annual Wellness Visit  11/22/2023   Pneumonia Vaccine  Completed   Flu Shot  Completed   Zoster (Shingles) Vaccine  Completed   HPV Vaccine  Aged Out  *Topic was postponed. The date shown is not the original due date.    12. Provider List Update: Patient Care Team    Relationship Specialty Notifications Start End  Isaac Bliss, Rayford Halsted, MD PCP - General Internal Medicine  10/16/18   Michael Boston, MD Consulting Physician General Surgery  11/13/20      13. Advance Directives: Does Patient Have a Medical Advance Directive?: Yes Type of Advance Directive: Healthcare Power of Attorney, Living will, Out of facility DNR (pink MOST or yellow form) Copy of Louise in Chart?: No - copy requested  14. Opioids: Patient is not on any opioid prescriptions and has no risk factors for a substance use disorder.   15.   Goals      Activity and Exercise Increased     Evidence-based guidance:  Review current exercise levels.  Assess patient perspective on exercise or activity level, barriers to increasing activity, motivation and readiness for change.  Recommend or set healthy exercise goal based on individual tolerance.   Encourage small steps toward making change in amount of exercise or activity.  Urge reduction of sedentary activities or screen time.  Promote group activities within the community or with family or support person.  Consider referral to rehabiliation therapist for assessment and exercise/activity plan.   Notes:      travel         I have personally reviewed and noted the following in the patient's chart:   Medical and social history Use of alcohol, tobacco or illicit drugs  Current medications and supplements Functional ability and status Nutritional status Physical activity Advanced directives List of other physicians Hospitalizations, surgeries, and ER visits in previous 12 months Vitals Screenings to include cognitive, depression, and falls Referrals and appointments  In addition, I have reviewed and discussed with patient certain preventive protocols, quality metrics, and best practice recommendations. A written personalized care plan for preventive services as well as general preventive health recommendations were provided to patient.  Impression and Plan:  Medicare annual wellness visit, subsequent  Hyperglycemia  Subclinical hypothyroidism - Plan: CBC with Differential/Platelet, Comprehensive metabolic panel, Lipid panel, TSH  Encounter for hepatitis C screening test for low risk patient - Plan: Hepatitis C antibody  Prostate cancer screening - Plan: PSA  -Recommend routine eye and dental care. -Healthy lifestyle discussed in detail. -Labs to be updated today. -  Prostate cancer screening: PSA today Health Maintenance  Topic Date Due   Hepatitis C Screening: USPSTF Recommendation to screen - Ages 42-79 yo.  Never done   DTaP/Tdap/Td vaccine (2 - Tdap) 10/28/2019   COVID-19 Vaccine (5 - 2023-24 season) 12/06/2022*   Colon Cancer Screening  10/01/2023*   Medicare Annual Wellness Visit  11/22/2023   Pneumonia Vaccine  Completed   Flu Shot  Completed   Zoster  (Shingles) Vaccine  Completed   HPV Vaccine  Aged Out  *Topic was postponed. The date shown is not the original due date.    -Advised to update Tdap at pharmacy.    Lelon Frohlich, MD [facility]

## 2022-11-22 LAB — HEPATITIS C ANTIBODY: Hepatitis C Ab: NONREACTIVE

## 2022-11-27 ENCOUNTER — Encounter: Payer: Self-pay | Admitting: Internal Medicine

## 2022-11-27 ENCOUNTER — Other Ambulatory Visit: Payer: Self-pay | Admitting: Internal Medicine

## 2022-11-27 DIAGNOSIS — E785 Hyperlipidemia, unspecified: Secondary | ICD-10-CM | POA: Insufficient documentation

## 2022-11-27 DIAGNOSIS — E782 Mixed hyperlipidemia: Secondary | ICD-10-CM

## 2022-12-05 ENCOUNTER — Encounter: Payer: Self-pay | Admitting: Internal Medicine

## 2023-01-22 ENCOUNTER — Encounter: Payer: Self-pay | Admitting: Internal Medicine

## 2023-01-30 ENCOUNTER — Ambulatory Visit (INDEPENDENT_AMBULATORY_CARE_PROVIDER_SITE_OTHER): Payer: PPO | Admitting: Internal Medicine

## 2023-01-30 ENCOUNTER — Encounter: Payer: Self-pay | Admitting: Internal Medicine

## 2023-01-30 VITALS — BP 120/68 | HR 60 | Temp 97.8°F | Wt 152.8 lb

## 2023-01-30 DIAGNOSIS — R350 Frequency of micturition: Secondary | ICD-10-CM

## 2023-01-30 LAB — POC URINALSYSI DIPSTICK (AUTOMATED)
Bilirubin, UA: NEGATIVE
Blood, UA: NEGATIVE
Glucose, UA: NEGATIVE
Ketones, UA: NEGATIVE
Leukocytes, UA: NEGATIVE
Nitrite, UA: NEGATIVE
Protein, UA: NEGATIVE
Spec Grav, UA: 1.01 (ref 1.010–1.025)
Urobilinogen, UA: 0.2 E.U./dL
pH, UA: 6 (ref 5.0–8.0)

## 2023-01-30 NOTE — Progress Notes (Signed)
Established Patient Office Visit     CC/Reason for Visit: Urinary frequency  HPI: Edward Berger is a 77 y.o. male who is coming in today for the above mentioned reasons.  He has noticed increased urinary frequency.  He feels like the volume of urine is high.  We asked him to come in to rule out infection.  No other symptoms.   Past Medical/Surgical History: Past Medical History:  Diagnosis Date   Blood transfusion without reported diagnosis    years ago with surgery    Cervical radiculopathy, chronic     Past Surgical History:  Procedure Laterality Date   ANKLE SURGERY     right   COLONOSCOPY     ENT     nasal polyp removal    SHOULDER SURGERY     left    Social History:  reports that he has never smoked. He has never used smokeless tobacco. He reports current alcohol use. He reports that he does not use drugs.  Allergies: No Known Allergies  Family History:  Family History  Problem Relation Age of Onset   Heart disease Mother    Cancer Father        prostate   Colon cancer Neg Hx    Colon polyps Neg Hx    Esophageal cancer Neg Hx    Rectal cancer Neg Hx    Stomach cancer Neg Hx      Current Outpatient Medications:    finasteride (PROPECIA) 1 MG tablet, Take 1 tablet (1 mg total) by mouth daily., Disp: 90 tablet, Rfl: 1   hydrocortisone (ANUSOL-HC) 2.5 % rectal cream, Place 1 application rectally 2 (two) times daily., Disp: 30 g, Rfl: 2   ibuprofen (ADVIL,MOTRIN) 200 MG tablet, Take 400 mg by mouth every 6 (six) hours as needed for pain., Disp: , Rfl:    Multiple Vitamin (MULTIVITAMIN) tablet, Take 1 tablet by mouth daily., Disp: , Rfl:    Omega-3 Fatty Acids (FISH OIL) 1000 MG CAPS, Take 1 capsule by mouth daily., Disp: , Rfl:   Review of Systems:  Negative unless indicated in HPI.   Physical Exam: Vitals:   01/30/23 0959  BP: 120/68  Pulse: 60  Temp: 97.8 F (36.6 C)  TempSrc: Oral  SpO2: 99%  Weight: 152 lb 12.8 oz (69.3 kg)     Body mass index is 23.58 kg/m.   Physical Exam Vitals reviewed.  Constitutional:      Appearance: Normal appearance.  HENT:     Head: Normocephalic and atraumatic.  Eyes:     Conjunctiva/sclera: Conjunctivae normal.     Pupils: Pupils are equal, round, and reactive to light.  Skin:    General: Skin is warm and dry.  Neurological:     General: No focal deficit present.     Mental Status: He is alert and oriented to person, place, and time.  Psychiatric:        Mood and Affect: Mood normal.        Behavior: Behavior normal.        Thought Content: Thought content normal.        Judgment: Judgment normal.      Impression and Plan:  Frequent urination -     POCT Urinalysis Dipstick (Automated)  Urinary frequency  -No signs of infection on urine dipstick, normal specific gravity.  Not a diabetic on recent labs.  Have advised observation for now.   Time spent:21 minutes reviewing chart, interviewing and examining patient and  formulating plan of care.     Lelon Frohlich, MD [facility]

## 2023-02-03 DIAGNOSIS — M5416 Radiculopathy, lumbar region: Secondary | ICD-10-CM | POA: Diagnosis not present

## 2023-02-18 DIAGNOSIS — M5416 Radiculopathy, lumbar region: Secondary | ICD-10-CM | POA: Diagnosis not present

## 2023-03-05 DIAGNOSIS — M5126 Other intervertebral disc displacement, lumbar region: Secondary | ICD-10-CM | POA: Diagnosis not present

## 2023-03-05 DIAGNOSIS — M47816 Spondylosis without myelopathy or radiculopathy, lumbar region: Secondary | ICD-10-CM | POA: Diagnosis not present

## 2023-03-05 DIAGNOSIS — M5416 Radiculopathy, lumbar region: Secondary | ICD-10-CM | POA: Diagnosis not present

## 2023-03-11 DIAGNOSIS — M5416 Radiculopathy, lumbar region: Secondary | ICD-10-CM | POA: Diagnosis not present

## 2023-03-13 DIAGNOSIS — Z7189 Other specified counseling: Secondary | ICD-10-CM | POA: Diagnosis not present

## 2023-03-13 DIAGNOSIS — Z23 Encounter for immunization: Secondary | ICD-10-CM | POA: Diagnosis not present

## 2023-03-25 ENCOUNTER — Other Ambulatory Visit: Payer: Self-pay | Admitting: Internal Medicine

## 2023-03-31 ENCOUNTER — Encounter: Payer: Self-pay | Admitting: Internal Medicine

## 2023-04-01 NOTE — Telephone Encounter (Signed)
Noted  

## 2023-04-11 DIAGNOSIS — H6122 Impacted cerumen, left ear: Secondary | ICD-10-CM | POA: Diagnosis not present

## 2023-04-11 DIAGNOSIS — H6121 Impacted cerumen, right ear: Secondary | ICD-10-CM | POA: Diagnosis not present

## 2023-04-22 DIAGNOSIS — M5416 Radiculopathy, lumbar region: Secondary | ICD-10-CM | POA: Diagnosis not present

## 2023-04-24 DIAGNOSIS — H6122 Impacted cerumen, left ear: Secondary | ICD-10-CM | POA: Diagnosis not present

## 2023-04-28 ENCOUNTER — Encounter: Payer: Self-pay | Admitting: Internal Medicine

## 2023-05-02 DIAGNOSIS — H903 Sensorineural hearing loss, bilateral: Secondary | ICD-10-CM | POA: Diagnosis not present

## 2023-05-06 ENCOUNTER — Telehealth: Payer: Self-pay | Admitting: Internal Medicine

## 2023-05-06 ENCOUNTER — Other Ambulatory Visit: Payer: Self-pay

## 2023-05-06 ENCOUNTER — Other Ambulatory Visit (INDEPENDENT_AMBULATORY_CARE_PROVIDER_SITE_OTHER): Payer: PPO

## 2023-05-06 DIAGNOSIS — E782 Mixed hyperlipidemia: Secondary | ICD-10-CM | POA: Diagnosis not present

## 2023-05-06 LAB — LIPID PANEL
Cholesterol: 206 mg/dL — ABNORMAL HIGH (ref 0–200)
HDL: 62.3 mg/dL (ref >39.00)
LDL Cholesterol: 127 mg/dL — ABNORMAL HIGH (ref 0–99)
NonHDL: 143.5
Total CHOL/HDL Ratio: 3
Triglycerides: 85 mg/dL (ref 0.0–149.0)
VLDL: 17 mg/dL (ref 0.0–40.0)

## 2023-05-06 NOTE — Telephone Encounter (Signed)
Pt called, returning CMA's call regarding labs. CMA was with a patient. Pt asked that CMA call back at her earliest convenience. 

## 2023-05-07 ENCOUNTER — Other Ambulatory Visit: Payer: Self-pay | Admitting: *Deleted

## 2023-05-07 MED ORDER — ATORVASTATIN CALCIUM 20 MG PO TABS: 20.0000 mg | ORAL_TABLET | Freq: Every day | ORAL | 0 refills | Status: DC

## 2023-05-20 DIAGNOSIS — M5416 Radiculopathy, lumbar region: Secondary | ICD-10-CM | POA: Diagnosis not present

## 2023-06-06 ENCOUNTER — Encounter: Payer: Self-pay | Admitting: Internal Medicine

## 2023-07-07 DIAGNOSIS — M5126 Other intervertebral disc displacement, lumbar region: Secondary | ICD-10-CM | POA: Diagnosis not present

## 2023-07-07 DIAGNOSIS — M5416 Radiculopathy, lumbar region: Secondary | ICD-10-CM | POA: Diagnosis not present

## 2023-07-07 DIAGNOSIS — M48061 Spinal stenosis, lumbar region without neurogenic claudication: Secondary | ICD-10-CM | POA: Diagnosis not present

## 2023-07-29 ENCOUNTER — Encounter: Payer: Self-pay | Admitting: Internal Medicine

## 2023-07-31 DIAGNOSIS — D485 Neoplasm of uncertain behavior of skin: Secondary | ICD-10-CM | POA: Diagnosis not present

## 2023-07-31 DIAGNOSIS — L821 Other seborrheic keratosis: Secondary | ICD-10-CM | POA: Diagnosis not present

## 2023-07-31 DIAGNOSIS — L219 Seborrheic dermatitis, unspecified: Secondary | ICD-10-CM | POA: Diagnosis not present

## 2023-07-31 DIAGNOSIS — L820 Inflamed seborrheic keratosis: Secondary | ICD-10-CM | POA: Diagnosis not present

## 2023-07-31 DIAGNOSIS — D0439 Carcinoma in situ of skin of other parts of face: Secondary | ICD-10-CM | POA: Diagnosis not present

## 2023-08-02 ENCOUNTER — Other Ambulatory Visit: Payer: Self-pay | Admitting: Internal Medicine

## 2023-08-04 MED ORDER — ROSUVASTATIN CALCIUM 5 MG PO TABS: 5.0000 mg | ORAL_TABLET | Freq: Every day | ORAL | 3 refills | Status: DC

## 2023-08-04 NOTE — Telephone Encounter (Signed)
Pharmacy called to say they received refill requests for both: atorvastatin (LIPITOR) 20 MG tablet rosuvastatin (CRESTOR) 5 MG tablet  But, they are wondering if this was done in error, since both Rxs are not normally taken together.  Please advise.

## 2023-08-07 NOTE — Telephone Encounter (Signed)
Spoke with pharmacist and informed him that the patient now takes Crestor 5 mg.

## 2023-08-14 DIAGNOSIS — C44329 Squamous cell carcinoma of skin of other parts of face: Secondary | ICD-10-CM | POA: Diagnosis not present

## 2023-08-14 DIAGNOSIS — L570 Actinic keratosis: Secondary | ICD-10-CM | POA: Diagnosis not present

## 2023-08-14 DIAGNOSIS — D485 Neoplasm of uncertain behavior of skin: Secondary | ICD-10-CM | POA: Diagnosis not present

## 2023-08-21 DIAGNOSIS — C44329 Squamous cell carcinoma of skin of other parts of face: Secondary | ICD-10-CM | POA: Diagnosis not present

## 2023-09-03 DIAGNOSIS — L570 Actinic keratosis: Secondary | ICD-10-CM | POA: Diagnosis not present

## 2023-09-03 DIAGNOSIS — D2272 Melanocytic nevi of left lower limb, including hip: Secondary | ICD-10-CM | POA: Diagnosis not present

## 2023-09-03 DIAGNOSIS — L821 Other seborrheic keratosis: Secondary | ICD-10-CM | POA: Diagnosis not present

## 2023-09-03 DIAGNOSIS — D2271 Melanocytic nevi of right lower limb, including hip: Secondary | ICD-10-CM | POA: Diagnosis not present

## 2023-09-03 DIAGNOSIS — Z85828 Personal history of other malignant neoplasm of skin: Secondary | ICD-10-CM | POA: Diagnosis not present

## 2023-09-03 DIAGNOSIS — S6010XA Contusion of unspecified finger with damage to nail, initial encounter: Secondary | ICD-10-CM | POA: Diagnosis not present

## 2023-09-03 DIAGNOSIS — L578 Other skin changes due to chronic exposure to nonionizing radiation: Secondary | ICD-10-CM | POA: Diagnosis not present

## 2023-09-03 DIAGNOSIS — D2371 Other benign neoplasm of skin of right lower limb, including hip: Secondary | ICD-10-CM | POA: Diagnosis not present

## 2023-09-03 DIAGNOSIS — D225 Melanocytic nevi of trunk: Secondary | ICD-10-CM | POA: Diagnosis not present

## 2023-09-16 DIAGNOSIS — H2513 Age-related nuclear cataract, bilateral: Secondary | ICD-10-CM | POA: Diagnosis not present

## 2023-09-16 DIAGNOSIS — H52203 Unspecified astigmatism, bilateral: Secondary | ICD-10-CM | POA: Diagnosis not present

## 2023-09-30 ENCOUNTER — Other Ambulatory Visit: Payer: Self-pay | Admitting: Internal Medicine

## 2023-10-03 ENCOUNTER — Telehealth: Payer: Self-pay | Admitting: *Deleted

## 2023-10-03 NOTE — Telephone Encounter (Signed)
 Copied from CRM [id number]. Topic: Clinical - Medication Refill >> Oct 03, 2023 11:16 AM Merlynn LABOR wrote: Most Recent Primary Care Visit:  Provider: [facility]  Department: [facility]  Visit Type: LAB  Date: 05/06/2023  Medication: finasteride  (PROPECIA ) 1 MG tablet   Has the patient contacted their pharmacy? Yes (Agent: If no, request that the patient contact the pharmacy for the refill. If patient does not wish to contact the pharmacy document the reason why and proceed with request.) (Agent: If yes, when and what did the pharmacy advise?)  Patient called pharmacy and was advised that a follow up request was sent to provider for fulfillment. Patient is out of medication awaiting refill.   Is this the correct pharmacy for this prescription? Yes If no, delete pharmacy and type the correct one.  This is the patient's preferred pharmacy:  [facility] [address], [place] - [address] ANNA MULLIGAN [place] [address] Phone: [phone] Fax: [phone]    Has the prescription been filled recently? No  Is the patient out of the medication? Yes  Has the patient been seen for an appointment in the last year OR does the patient have an upcoming appointment? Yes  Can we respond through MyChart? Yes  Agent: Please be advised that Rx refills may take up to 3 business days. We ask that you follow-up with your pharmacy.

## 2023-10-06 ENCOUNTER — Ambulatory Visit: Payer: Self-pay | Admitting: Internal Medicine

## 2023-10-06 DIAGNOSIS — C44329 Squamous cell carcinoma of skin of other parts of face: Secondary | ICD-10-CM | POA: Diagnosis not present

## 2023-10-06 NOTE — Telephone Encounter (Addendum)
 Copied from CRM [id number]. Topic: Clinical - Prescription Issue >> Oct 06, 2023 12:28 PM Rolin D wrote: Reason for CRM: patient has put in a med refill for finasteride  (PROPECIA ) 1 MG tablet  But has not received meds yet   Chief Complaint: Med refill very delayed Pertinent Negatives: Patient denies symptoms from lack of med at this time Disposition: [] ED /[] Urgent Care (no appt availability in office) / [] Appointment(In office/virtual)/ []  [facility]/ [] Home Care/ [] Refused Recommended Disposition /[]  Mobile Bus/ [x]  Follow-up with PCP Additional Notes: Pt reporting that he has requested this refill multiple times for a over a week. Connected to [facility] line to front desk. Front desk reporting that they have pt's refill request from Friday and that it takes 3 business days to refill, Dr. Theophilus is in office today. Nurse requested that office alert Dr. Theophilus to ensure refill is processed. Front desk reporting again that it takes 3 business days to refill. Front desk had nurse relay this to pt. Nurse was not given opportunity to relay to front desk that pt's first request was earlier last week and that this refill is very delayed per pt. Relayed info from front desk to pt. Pt is very upset. Offered to connect to CAL again for him to speak with office. Pt refuses to speak to anyone on phone any longer. Pt reporting they are totally negligent in following their own procedures. I'm going directly to the office to make them refill it. Pt is on his way to the office now to discuss. Gave pt the contact info for patient experience line.  Reason for Disposition  [1] Prescription refill request for NON-ESSENTIAL medicine (i.e., no harm to patient if med not taken) AND [2] triager unable to refill per department policy  Answer Assessment - Initial Assessment Questions 1. DRUG NAME: What medicine do you need to have refilled?     Finasteride  1 mg tab 2. REFILLS REMAINING: How  many refills are remaining? (Note: The label on the medicine or pill bottle will show how many refills are remaining. If there are no refills remaining, then a renewal may be needed.)     No refills Pt confirms no symptoms from lack of med at this time. Pt refusing to let nurse go further.  Protocols used: Medication Refill and Renewal Call-A-AH

## 2023-10-06 NOTE — Telephone Encounter (Signed)
 Edward Berger is aware refill can take up to 3 business days

## 2023-10-06 NOTE — Telephone Encounter (Signed)
 Pt came in person to check on status on his refill Finasteride . Pt was informed again that refills can take up to three business days. He asked when the request from the pharmacy was received, I let him know it was received today. Pt was irate and demanded to know why his medication would take 3 business days to refill. I let him know that is office policy. He stated office policy should be changed and he didn't understand why he would need to wait. Pt is requesting refill on medication asap. I let him know I would send back another msg as high priority. Pt walked out upset.

## 2023-10-07 NOTE — Telephone Encounter (Signed)
 FYI - refill was sent 10/06/23

## 2023-11-04 DIAGNOSIS — C44329 Squamous cell carcinoma of skin of other parts of face: Secondary | ICD-10-CM | POA: Diagnosis not present

## 2023-11-04 DIAGNOSIS — T8131XA Disruption of external operation (surgical) wound, not elsewhere classified, initial encounter: Secondary | ICD-10-CM | POA: Diagnosis not present

## 2023-11-25 ENCOUNTER — Ambulatory Visit (INDEPENDENT_AMBULATORY_CARE_PROVIDER_SITE_OTHER): Payer: PPO | Admitting: Family Medicine

## 2023-11-25 DIAGNOSIS — Z0000 Encounter for general adult medical examination without abnormal findings: Secondary | ICD-10-CM | POA: Diagnosis not present

## 2023-11-25 NOTE — Patient Instructions (Addendum)
 I really enjoyed getting to talk with you today! I am available on Tuesdays and Thursdays for virtual visits if you have any questions or concerns, or if I can be of any further assistance.   CHECKLIST FROM ANNUAL WELLNESS VISIT:  -Follow up (please call to schedule if not scheduled after visit):   -yearly for annual wellness visit with primary care office  Here is a list of your preventive care/health maintenance measures and the plan for each if any are due:  PLAN For any measures below that may be due:  -Colonoscopy is due in June of this year (2025) per review of correspondence from your gastroenterologist. Please call your gastroenterology office to schedule at [phone]  Health Maintenance  Topic Date Due   Colonoscopy  03/27/2022   Medicare Annual Wellness (AWV)  11/24/2024   DTaP/Tdap/Td (3 - Td or Tdap) 04/25/2033   Pneumonia Vaccine 11+ Years old  Completed   INFLUENZA VACCINE  Completed   COVID-19 Vaccine  Completed   Hepatitis C Screening  Completed   Zoster Vaccines- Shingrix  Completed   HPV VACCINES  Aged Out    -See a dentist at least yearly  -Get your eyes checked and then per your eye specialist's recommendations  -Other issues addressed today:   -I have included below further information regarding a healthy whole foods based diet, physical activity guidelines for adults, stress management and opportunities for social connections. I hope you find this information useful.   -----------------------------------------------------------------------------------------------------------------------------------------------------------------------------------------------------------------------------------------------------------    NUTRITION: -eat real food: lots of colorful vegetables (half the plate) and fruits -5-7 servings of vegetables and fruits per day (fresh or steamed is best), exp. 2 servings of vegetables with lunch and dinner and 2 servings of fruit  per day. Berries and greens such as kale and collards are great choices.  -consume on a regular basis:  fresh fruits, fresh veggies, fish, nuts, seeds, healthy oils (such as olive oil, avocado oil), whole grains (make sure for bread/pasta/crackers/etc., that the first ingredient on label contains the word "whole"), legumes. -can eat small amounts of dairy and lean meat (no larger than the palm of your hand), but avoid processed meats such as ham, bacon, lunch meat, etc. -drink water -try to avoid fast food and pre-packaged foods, processed meat, ultra processed foods/beverages (donuts, candy, etc.) -most experts advise limiting sodium to < 2300mg  per day, should limit further is any chronic conditions such as high blood pressure, heart disease, diabetes, etc. The American Heart Association advised that < 1500mg  is is ideal -try to avoid foods/beverages that contain any ingredients with names you do not recognize  -try to avoid foods/beverages  with added sugar or sweeteners/sweets  -try to avoid sweet drinks (including diet drinks): soda, juice, Gatorade, sweet tea, power drinks, diet drinks -try to avoid white rice, white bread, pasta (unless whole grain)  EXERCISE GUIDELINES FOR ADULTS: -if you wish to increase your physical activity, do so gradually and with the approval of your doctor -STOP and seek medical care immediately if you have any chest pain, chest discomfort or trouble breathing when starting or increasing exercise  -move and stretch your body, legs, feet and arms when sitting for long periods -Physical activity guidelines for optimal health in adults: -get at least 150 minutes per week of moderate exercise (can talk, but not sing); this is about 20-30 minutes of sustained activity 5-7 days per week or two 10-15 minute episodes of sustained activity 5-7 days per week -do some muscle building/resistance training/strength  training at least 2 days per week  -balance exercises 3+ days per  week:   Stand somewhere where you have something sturdy to hold onto if you lose balance    1) lift up on toes, then back down, start with 5x per day and work up to 20x   2) stand and lift one leg straight out to the side so that foot is a few inches of the floor, start with 5x each side and work up to 20x each side   3) stand on one foot, start with 5 seconds each side and work up to 20 seconds on each side  If you need ideas or help with getting more active:  -Silver sneakers https://tools.silversneakers.com  -Walk with a Doc: http://www.duncan-williams.com/  -try to include resistance (weight lifting/strength building) and balance exercises twice per week: or the following link for ideas: http://castillo-powell.com/  BuyDucts.dk  STRESS MANAGEMENT: -can try meditating, or just sitting quietly with deep breathing while intentionally relaxing all parts of your body for 5 minutes daily -if you need further help with stress, anxiety or depression please follow up with your primary doctor or contact the wonderful folks at [facility]: [phone]  SOCIAL CONNECTIONS: -options in [place] if you wish to engage in more social and exercise related activities:  -Silver sneakers https://tools.silversneakers.com  -Walk with a Doc: http://www.duncan-williams.com/  -Check out the [facility] Active Adults 50+ section on the [place] of [employer] (hiking clubs, book clubs, cards and games, chess, exercise classes, aquatic classes and much more) - see the website for details: https://www.[place].gov/departments/parks-recreation/active-adults50  -YouTube has lots of exercise videos for different ages and abilities as well  -[facility] (a variety of indoor and outdoor inperson activities for adults). [phone]. [address]  -Virtual Online Classes (a variety of  topics): see seniorplanet.org or call [phone]  -consider volunteering at a school, hospice center, church, senior center or elsewhere

## 2023-11-25 NOTE — Progress Notes (Signed)
 PATIENT CHECK-IN and HEALTH RISK ASSESSMENT QUESTIONNAIRE:  -completed by phone/video for upcoming Medicare Preventive Visit   Pre-Visit Check-in: 1)Vitals (height, wt, BP, etc) - record in vitals section for visit on day of visit Request home vitals (wt, BP, etc.) and enter into vitals, THEN update Vital Signs SmartPhrase below at the top of the HPI. See below.  2)Review and Update Medications, Allergies PMH, Surgeries, Social history in Epic 3)Hospitalizations in the last year with date/reason? n - outpt back surgery 4)Review and Update Care Team (patient's specialists) in Epic 5) Complete PHQ9 in Epic  6) Complete Fall Screening in Epic 7)Review all Health Maintenance Due and order under PCP if not done.  Medicare Wellness Patient Questionnaire:  Answer theses question about your habits: How often do you have a drink containing alcohol? 1-2 drinks 3 x per week Have you ever smoked?n On average, how many days per week do you engage in moderate to strenuous exercise (like a brisk walk)? 5 days per week for 60 minutes - nordic walking, also just signed up to start doing some wt/muscle building Typical breakfast: oatmeal, fruit Typical lunch: yogurt, soup, fish Typical dinner: protein and veggies Typical snacks: nuts  Beverages: water  Answer theses question about your everyday activities: Can you perform most household chores?y Are you deaf or have significant trouble hearing? Wears hearing aids, doing well Do you feel that you have a problem with memory? n Do you feel safe at home?y Last dentist visit? Goes every 6 months 8. Do you have any difficulty performing your everyday activities?n Are you having any difficulty walking, taking medications on your own, and or difficulty managing daily home needs?n Do you have difficulty walking or climbing stairs?n Do you have difficulty dressing or bathing?n Do you have difficulty doing errands alone such as visiting a doctor's office or  shopping?n Do you currently have any difficulty preparing food and eating?n Do you currently have any difficulty using the toilet?n Do you have any difficulty managing your finances?n Do you have any difficulties with housekeeping of managing your housekeeping?n   Do you have Advanced Directives in place (Living Will, Healthcare Power or Attorney)?  yes   Last eye Exam and location? One month ago with Dr. Charlotte Sanes   Do you currently use prescribed or non-prescribed narcotic or opioid pain medications? no     ----------------------------------------------------------------------------------------------------------------------------------------------------------------------------------------------------------------------  Because this visit was a virtual/telehealth visit, some criteria may be missing or patient reported. Any vitals not documented were not able to be obtained and vitals that have been documented are patient reported.    MEDICARE ANNUAL PREVENTIVE CARE VISIT WITH PROVIDER (Welcome to Medicare, initial annual wellness or annual wellness exam)  Virtual Visit via Video Note  I connected with Montay Vanvoorhis on 11/25/23  by  a video enabled telemedicine application and verified that I am speaking with the correct person using two identifiers.  Location patient: home Location provider:work or home office Persons participating in the virtual visit: patient, provider  Concerns and/or follow up today: reports is doing well and has no new concerns.   See HM section in Epic for other details of completed HM.    ROS: negative for report of fevers, unintentional weight loss, vision changes, vision loss, hearing loss or change, chest pain, sob, hemoptysis, melena, hematochezia, hematuria, falls, bleeding or bruising, thoughts of suicide or self harm, memory loss  Patient-completed extensive health risk assessment - reviewed and discussed with the patient: See Health Risk  Assessment completed with patient  prior to the visit either above or in recent phone note. This was reviewed in detailed with the patient today and appropriate recommendations, orders and referrals were placed as needed per Summary below and patient instructions.   Review of Medical History: -PMH, PSH, Family History and current specialty and care providers reviewed and updated and listed below   Patient Care Team: Philip Aspen, Limmie Patricia, MD as PCP - General (Internal Medicine) Karie Soda, MD as Consulting Physician (General Surgery)   Past Medical History:  Diagnosis Date   Blood transfusion without reported diagnosis    years ago with surgery    Cervical radiculopathy, chronic     Past Surgical History:  Procedure Laterality Date   ANKLE SURGERY     right   COLONOSCOPY     ENT     nasal polyp removal    SHOULDER SURGERY     left    Social History   Socioeconomic History   Marital status: Married    Spouse name: Not on file   Number of children: Not on file   Years of education: Not on file   Highest education level: Master's degree (e.g., MA, MS, MEng, MEd, MSW, MBA)  Occupational History   Not on file  Tobacco Use   Smoking status: Never   Smokeless tobacco: Never  Vaping Use   Vaping status: Never Used  Substance and Sexual Activity   Alcohol use: Yes    Comment: social   Drug use: No   Sexual activity: Not on file  Other Topics Concern   Not on file  Social History Narrative   Not on file   Social Drivers of Health   Financial Resource Strain: Low Risk  (11/24/2023)   Overall Financial Resource Strain (CARDIA)    Difficulty of Paying Living Expenses: Not hard at all  Food Insecurity: No Food Insecurity (11/24/2023)   Hunger Vital Sign    Worried About Running Out of Food in the Last Year: Never true    Ran Out of Food in the Last Year: Never true  Transportation Needs: No Transportation Needs (11/24/2023)   PRAPARE - Scientist, research (physical sciences) (Medical): No    Lack of Transportation (Non-Medical): No  Physical Activity: Sufficiently Active (11/24/2023)   Exercise Vital Sign    Days of Exercise per Week: 5 days    Minutes of Exercise per Session: 60 min  Stress: No Stress Concern Present (11/24/2023)   [employer] of Occupational Health - Occupational Stress Questionnaire    Feeling of Stress : Not at all  Social Connections: Unknown (11/24/2023)   Social Connection and Isolation Panel [NHANES]    Frequency of Communication with Friends and Family: Once a week    Frequency of Social Gatherings with Friends and Family: Twice a week    Attends Religious Services: Patient declined    Database administrator or Organizations: No    Attends Engineer, structural: Not on file    Marital Status: Married  Catering manager Violence: Not At Risk (11/20/2022)   Humiliation, Afraid, Rape, and Kick questionnaire    Fear of Current or Ex-Partner: No    Emotionally Abused: No    Physically Abused: No    Sexually Abused: No    Family History  Problem Relation Age of Onset   Heart disease Mother    Cancer Father        prostate   Colon cancer Neg Hx  Colon polyps Neg Hx    Esophageal cancer Neg Hx    Rectal cancer Neg Hx    Stomach cancer Neg Hx     Current Outpatient Medications on File Prior to Visit  Medication Sig Dispense Refill   finasteride (PROPECIA) 1 MG tablet TAKE ONE TABLET BY MOUTH ONCE A DAY 90 tablet 0   hydrocortisone (ANUSOL-HC) 2.5 % rectal cream Place 1 application rectally 2 (two) times daily. 30 g 2   ibuprofen (ADVIL,MOTRIN) 200 MG tablet Take 400 mg by mouth every 6 (six) hours as needed for pain.     Multiple Vitamin (MULTIVITAMIN) tablet Take 1 tablet by mouth daily.     Omega-3 Fatty Acids (FISH OIL) 1000 MG CAPS Take 1 capsule by mouth daily.     rosuvastatin (CRESTOR) 5 MG tablet Take 1 tablet (5 mg total) by mouth daily. 90 tablet 3   No current facility-administered  medications on file prior to visit.    No Known Allergies     Physical Exam Vitals requested from patient and listed below if patient had equipment and was able to obtain at home for this virtual visit: There were no vitals filed for this visit. Estimated body mass index is 23.58 kg/m as calculated from the following:   Height as of 11/20/22: 5' 7.5" (1.715 m).   Weight as of 01/30/23: 152 lb 12.8 oz (69.3 kg).  EKG (optional): deferred due to virtual visit  GENERAL: alert, oriented, no acute distress detected; full vision exam deferred due to pandemic and/or virtual encounter  HEENT: atraumatic, conjunttiva clear, no obvious abnormalities on inspection of external nose and ears  NECK: normal movements of the head and neck  LUNGS: on inspection no signs of respiratory distress, breathing rate appears normal, no obvious gross SOB, gasping or wheezing  CV: no obvious cyanosis  MS: moves all visible extremities without noticeable abnormality  PSYCH/NEURO: pleasant and cooperative, no obvious depression or anxiety, speech and thought processing grossly intact, Cognitive function grossly intact  Flowsheet Row Office Visit from 01/30/2023 in [facility]  PHQ-9 Total Score 0           11/25/2023   12:12 PM 01/30/2023   10:02 AM 11/21/2022    7:39 AM 04/01/2022   11:43 AM 02/06/2022   10:04 AM  Depression screen PHQ 2/9  Decreased Interest 0 0 0 0 0  Down, Depressed, Hopeless 0 0 0 0 0  PHQ - 2 Score 0 0 0 0 0  Altered sleeping  0 0 0 0  Tired, decreased energy  0 0 0 0  Change in appetite  0 0 0 0  Feeling bad or failure about yourself   0 0 0 0  Trouble concentrating  0 0 0 0  Moving slowly or fidgety/restless  0 0 0 0  Suicidal thoughts  0 0 0 0  PHQ-9 Score  0 0 0 0  Difficult doing work/chores  Not difficult at all Not difficult at all Not difficult at all Not difficult at all       11/20/2022   12:59 PM 01/28/2023    9:41 AM 01/30/2023    10:02 AM 11/24/2023    2:47 PM 11/25/2023   12:12 PM  Fall Risk  Falls in the past year? 0 0 0 0 0  Was there an injury with Fall? 0  0 0 0  Fall Risk Category Calculator 0  0 0  0  Fall risk Follow  up   Falls evaluation completed  Falls evaluation completed     Patient-reported     SUMMARY AND PLAN:  Encounter for annual wellness exam in Medicare patient  Discussed applicable health maintenance/preventive health measures and advised and referred or ordered per patient preferences: -reviewed his chart and last recommendations from his GI doctor. He had initially been told needed colonosocpy in 5 years, but then was changed by GI to 7 years and is due this June. He reports he will call his GI office to schedule.  Health Maintenance  Topic Date Due   Colonoscopy  03/27/2022   Medicare Annual Wellness (AWV)  11/24/2024   DTaP/Tdap/Td (3 - Td or Tdap) 04/25/2033   Pneumonia Vaccine 103+ Years old  Completed   INFLUENZA VACCINE  Completed   COVID-19 Vaccine  Completed   Hepatitis C Screening  Completed   Zoster Vaccines- Shingrix  Completed   HPV VACCINES  Aged Out     Education and counseling on the following was provided based on the above review of health and a plan/checklist for the patient, along with additional information discussed, was provided for the patient in the patient instructions :    -Advised and counseled on a healthy lifestyle - including the importance of a healthy diet, regular physical activity, social connections -Reviewed patient's current diet. Congratulated on healthy diet and discussed dietary recs for Hyperlipidemia.A summary of a healthy diet was provided in the Patient Instructions.  -reviewed patient's current physical activity level and discussed exercise guidelines for adults. Congratulated and discussed importance of strength training at least 2 days per week which he has plan to add. Provided community resources and ideas for safe exercise at home to  assist in meeting exercise guideline recommendations in a safe and healthy way in patient instructions.  -Advise yearly dental visits at minimum and regular eye exams -Advised and counseled on alcohol safe limits  Follow up: see patient instructions   Patient Instructions  I really enjoyed getting to talk with you today! I am available on Tuesdays and Thursdays for virtual visits if you have any questions or concerns, or if I can be of any further assistance.   CHECKLIST FROM ANNUAL WELLNESS VISIT:  -Follow up (please call to schedule if not scheduled after visit):   -yearly for annual wellness visit with primary care office  Here is a list of your preventive care/health maintenance measures and the plan for each if any are due:  PLAN For any measures below that may be due:  -Colonoscopy is due in June of this year (2025) per review of correspondence from your gastroenterologist. Please call your gastroenterology office to schedule at [phone]  Health Maintenance  Topic Date Due   Colonoscopy  03/27/2022   Medicare Annual Wellness (AWV)  11/24/2024   DTaP/Tdap/Td (3 - Td or Tdap) 04/25/2033   Pneumonia Vaccine 30+ Years old  Completed   INFLUENZA VACCINE  Completed   COVID-19 Vaccine  Completed   Hepatitis C Screening  Completed   Zoster Vaccines- Shingrix  Completed   HPV VACCINES  Aged Out    -See a dentist at least yearly  -Get your eyes checked and then per your eye specialist's recommendations  -Other issues addressed today:   -I have included below further information regarding a healthy whole foods based diet, physical activity guidelines for adults, stress management and opportunities for social connections. I hope you find this information useful.    -----------------------------------------------------------------------------------------------------------------------------------------------------------------------------------------------------------------------------------------------------------    NUTRITION: -eat real  food: lots of colorful vegetables (half the plate) and fruits -5-7 servings of vegetables and fruits per day (fresh or steamed is best), exp. 2 servings of vegetables with lunch and dinner and 2 servings of fruit per day. Berries and greens such as kale and collards are great choices.  -consume on a regular basis:  fresh fruits, fresh veggies, fish, nuts, seeds, healthy oils (such as olive oil, avocado oil), whole grains (make sure for bread/pasta/crackers/etc., that the first ingredient on label contains the word "whole"), legumes. -can eat small amounts of dairy and lean meat (no larger than the palm of your hand), but avoid processed meats such as ham, bacon, lunch meat, etc. -drink water -try to avoid fast food and pre-packaged foods, processed meat, ultra processed foods/beverages (donuts, candy, etc.) -most experts advise limiting sodium to < 2300mg  per day, should limit further is any chronic conditions such as high blood pressure, heart disease, diabetes, etc. The American Heart Association advised that < 1500mg  is is ideal -try to avoid foods/beverages that contain any ingredients with names you do not recognize  -try to avoid foods/beverages  with added sugar or sweeteners/sweets  -try to avoid sweet drinks (including diet drinks): soda, juice, Gatorade, sweet tea, power drinks, diet drinks -try to avoid white rice, white bread, pasta (unless whole grain)  EXERCISE GUIDELINES FOR ADULTS: -if you wish to increase your physical activity, do so gradually and with the approval of your doctor -STOP and seek medical care immediately if you have any chest pain, chest discomfort or trouble breathing when starting or  increasing exercise  -move and stretch your body, legs, feet and arms when sitting for long periods -Physical activity guidelines for optimal health in adults: -get at least 150 minutes per week of moderate exercise (can talk, but not sing); this is about 20-30 minutes of sustained activity 5-7 days per week or two 10-15 minute episodes of sustained activity 5-7 days per week -do some muscle building/resistance training/strength training at least 2 days per week  -balance exercises 3+ days per week:   Stand somewhere where you have something sturdy to hold onto if you lose balance    1) lift up on toes, then back down, start with 5x per day and work up to 20x   2) stand and lift one leg straight out to the side so that foot is a few inches of the floor, start with 5x each side and work up to 20x each side   3) stand on one foot, start with 5 seconds each side and work up to 20 seconds on each side  If you need ideas or help with getting more active:  -Silver sneakers https://tools.silversneakers.com  -Walk with a Doc: http://www.duncan-williams.com/  -try to include resistance (weight lifting/strength building) and balance exercises twice per week: or the following link for ideas: http://castillo-powell.com/  BuyDucts.dk  STRESS MANAGEMENT: -can try meditating, or just sitting quietly with deep breathing while intentionally relaxing all parts of your body for 5 minutes daily -if you need further help with stress, anxiety or depression please follow up with your primary doctor or contact the wonderful folks at [facility]: [phone]  SOCIAL CONNECTIONS: -options in [place] if you wish to engage in more social and exercise related activities:  -Silver sneakers https://tools.silversneakers.com  -Walk with a Doc: http://www.duncan-williams.com/  -Check out the [employer] Active Adults 50+  section on the [place] of [employer] (hiking clubs, book clubs, cards and games, chess, exercise classes, aquatic classes and  much more) - see the website for details: https://www.[place].gov/departments/parks-recreation/active-adults50  -YouTube has lots of exercise videos for different ages and abilities as well  -[facility] (a variety of indoor and outdoor inperson activities for adults). (718)191-8515. [address]  -Virtual Online Classes (a variety of topics): see seniorplanet.org or call [phone]  -consider volunteering at a school, hospice center, church, senior center or elsewhere            Terressa Koyanagi, DO

## 2023-12-13 ENCOUNTER — Encounter: Payer: Self-pay | Admitting: Internal Medicine

## 2023-12-17 ENCOUNTER — Ambulatory Visit (INDEPENDENT_AMBULATORY_CARE_PROVIDER_SITE_OTHER): Payer: PPO | Admitting: Internal Medicine

## 2023-12-17 ENCOUNTER — Encounter: Payer: Self-pay | Admitting: Internal Medicine

## 2023-12-17 VITALS — BP 120/70 | HR 55 | Temp 97.5°F | Ht 68.0 in | Wt 157.9 lb

## 2023-12-17 DIAGNOSIS — R739 Hyperglycemia, unspecified: Secondary | ICD-10-CM | POA: Diagnosis not present

## 2023-12-17 DIAGNOSIS — E782 Mixed hyperlipidemia: Secondary | ICD-10-CM | POA: Diagnosis not present

## 2023-12-17 DIAGNOSIS — Z1211 Encounter for screening for malignant neoplasm of colon: Secondary | ICD-10-CM | POA: Diagnosis not present

## 2023-12-17 DIAGNOSIS — Z0000 Encounter for general adult medical examination without abnormal findings: Secondary | ICD-10-CM | POA: Diagnosis not present

## 2023-12-17 LAB — CBC WITH DIFFERENTIAL/PLATELET
Basophils Absolute: 0 10*3/uL (ref 0.0–0.1)
Basophils Relative: 0.4 % (ref 0.0–3.0)
Eosinophils Absolute: 0.1 10*3/uL (ref 0.0–0.7)
Eosinophils Relative: 2.6 % (ref 0.0–5.0)
HCT: 42.3 % (ref 39.0–52.0)
Hemoglobin: 14 g/dL (ref 13.0–17.0)
Lymphocytes Relative: 25.5 % (ref 12.0–46.0)
Lymphs Abs: 1.4 10*3/uL (ref 0.7–4.0)
MCHC: 33.2 g/dL (ref 30.0–36.0)
MCV: 96 fl (ref 78.0–100.0)
Monocytes Absolute: 0.7 10*3/uL (ref 0.1–1.0)
Monocytes Relative: 13.5 % — ABNORMAL HIGH (ref 3.0–12.0)
Neutro Abs: 3.1 10*3/uL (ref 1.4–7.7)
Neutrophils Relative %: 58 % (ref 43.0–77.0)
Platelets: 181 10*3/uL (ref 150.0–400.0)
RBC: 4.41 Mil/uL (ref 4.22–5.81)
RDW: 13.2 % (ref 11.5–15.5)
WBC: 5.3 10*3/uL (ref 4.0–10.5)

## 2023-12-17 LAB — LIPID PANEL
Cholesterol: 161 mg/dL (ref 0–200)
HDL: 67.7 mg/dL (ref >39.00)
LDL Cholesterol: 79 mg/dL (ref 0–99)
NonHDL: 93.16
Total CHOL/HDL Ratio: 2
Triglycerides: 69 mg/dL (ref 0.0–149.0)
VLDL: 13.8 mg/dL (ref 0.0–40.0)

## 2023-12-17 LAB — COMPREHENSIVE METABOLIC PANEL
ALT: 15 U/L (ref 0–53)
AST: 18 U/L (ref 0–37)
Albumin: 4.2 g/dL (ref 3.5–5.2)
Alkaline Phosphatase: 56 U/L (ref 39–117)
BUN: 22 mg/dL (ref 6–23)
CO2: 32 meq/L (ref 19–32)
Calcium: 9.3 mg/dL (ref 8.4–10.5)
Chloride: 105 meq/L (ref 96–112)
Creatinine, Ser: 1.13 mg/dL (ref 0.40–1.50)
GFR: 62.46 mL/min (ref >60.00)
Glucose, Bld: 106 mg/dL — ABNORMAL HIGH (ref 70–99)
Potassium: 4 meq/L (ref 3.5–5.1)
Sodium: 143 meq/L (ref 135–145)
Total Bilirubin: 0.7 mg/dL (ref 0.2–1.2)
Total Protein: 6.5 g/dL (ref 6.0–8.3)

## 2023-12-17 LAB — PSA: PSA: 0.31 ng/mL (ref 0.10–4.00)

## 2023-12-17 NOTE — Progress Notes (Signed)
 Established Patient Office Visit     CC/Reason for Visit: Annual preventive exam  HPI: Edward Berger is a 78 y.o. male who is coming in today for the above mentioned reasons. 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  Doing well without concerns or complaints.  Has routine eye and dental care.  All immunizations are up-to-date.  Overdue for screening colonoscopy.   Past Medical/Surgical History: Past Medical History:  Diagnosis Date   Blood transfusion without reported diagnosis    years ago with surgery    Cervical radiculopathy, chronic     Past Surgical History:  Procedure Laterality Date   ANKLE SURGERY     right   COLONOSCOPY     ENT     nasal polyp removal    SHOULDER SURGERY     left    Social History:  reports that he has never smoked. He has never used smokeless tobacco. He reports current alcohol use. He reports that he does not use drugs.  Allergies: No Known Allergies  Family History:  Family History  Problem Relation Age of Onset   Heart disease Mother    Cancer Father        prostate   Colon cancer Neg Hx    Colon polyps Neg Hx    Esophageal cancer Neg Hx    Rectal cancer Neg Hx    Stomach cancer Neg Hx      Current Outpatient Medications:    finasteride (PROPECIA) 1 MG tablet, TAKE ONE TABLET BY MOUTH ONCE A DAY, Disp: 90 tablet, Rfl: 0   ibuprofen (ADVIL,MOTRIN) 200 MG tablet, Take 400 mg by mouth every 6 (six) hours as needed for pain., Disp: , Rfl:    Multiple Vitamin (MULTIVITAMIN) tablet, Take 1 tablet by mouth daily., Disp: , Rfl:    Omega-3 Fatty Acids (FISH OIL) 1000 MG CAPS, Take 1 capsule by mouth daily., Disp: , Rfl:    rosuvastatin (CRESTOR) 5 MG tablet, Take 1 tablet (5 mg total) by mouth daily., Disp: 90 tablet, Rfl: 3  Review of Systems:  Negative unless indicated in HPI.   Physical Exam: Vitals:   12/17/23 0817  BP: 120/70  Pulse: (!) 55  Temp: (!) 97.5 F  (36.4 C)  TempSrc: Oral  SpO2: 100%  Weight: 157 lb 14.4 oz (71.6 kg)  Height: 5\' 8"  (1.727 m)    Body mass index is 24.01 kg/m.   Physical Exam Vitals reviewed.  Constitutional:      General: He is not in acute distress.    Appearance: Normal appearance. He is not ill-appearing, toxic-appearing or diaphoretic.  HENT:     Head: Normocephalic.     Right Ear: Tympanic membrane, ear canal and external ear normal. There is no impacted cerumen.     Left Ear: Tympanic membrane, ear canal and external ear normal. There is no impacted cerumen.     Nose: Nose normal.     Mouth/Throat:     Mouth: Mucous membranes are moist.     Pharynx: Oropharynx is clear. No oropharyngeal exudate or posterior oropharyngeal erythema.  Eyes:     General: No scleral icterus.       Right eye: No discharge.        Left eye: No discharge.     Conjunctiva/sclera: Conjunctivae normal.     Pupils: Pupils are equal, round, and reactive to light.  Neck:     Vascular: No carotid bruit.  Cardiovascular:     Rate and Rhythm: Normal rate and regular rhythm.     Pulses: Normal pulses.     Heart sounds: Normal heart sounds.  Pulmonary:     Effort: Pulmonary effort is normal. No respiratory distress.     Breath sounds: Normal breath sounds.  Abdominal:     General: Abdomen is flat. Bowel sounds are normal.     Palpations: Abdomen is soft.  Musculoskeletal:        General: Normal range of motion.     Cervical back: Normal range of motion.  Skin:    General: Skin is warm and dry.  Neurological:     General: No focal deficit present.     Mental Status: He is alert and oriented to person, place, and time. Mental status is at baseline.  Psychiatric:        Mood and Affect: Mood normal.        Behavior: Behavior normal.        Thought Content: Thought content normal.        Judgment: Judgment normal.       Impression and Plan:  Encounter for preventive health examination -     PSA; Future  Mixed  hyperlipidemia -     CBC with Differential/Platelet; Future -     Comprehensive metabolic panel; Future -     Lipid panel; Future  Hyperglycemia  Screening for malignant neoplasm of colon -     Ambulatory referral to Gastroenterology   -Recommend routine eye and dental care. -Healthy lifestyle discussed in detail. -Labs to be updated today. -Prostate cancer screening: PSA today Health Maintenance  Topic Date Due   Colon Cancer Screening  03/27/2022   COVID-19 Vaccine (7 - 2024-25 season) 01/27/2024   Medicare Annual Wellness Visit  11/24/2024   DTaP/Tdap/Td vaccine (3 - Td or Tdap) 04/25/2033   Pneumonia Vaccine  Completed   Flu Shot  Completed   Hepatitis C Screening  Completed   Zoster (Shingles) Vaccine  Completed   HPV Vaccine  Aged Out     -Immunizations are up-to-date. -Overdue for repeat screening colonoscopy, GI referral placed.    Chaya Jan, MD [facility]

## 2023-12-18 ENCOUNTER — Encounter: Payer: Self-pay | Admitting: Internal Medicine

## 2023-12-25 ENCOUNTER — Encounter: Payer: Self-pay | Admitting: Internal Medicine

## 2023-12-26 ENCOUNTER — Other Ambulatory Visit: Payer: Self-pay | Admitting: Internal Medicine

## 2024-01-15 ENCOUNTER — Encounter: Payer: Self-pay | Admitting: Internal Medicine

## 2024-02-12 ENCOUNTER — Encounter: Payer: Self-pay | Admitting: Internal Medicine

## 2024-04-20 ENCOUNTER — Ambulatory Visit: Admitting: Gastroenterology

## 2024-04-21 ENCOUNTER — Ambulatory Visit: Admitting: Gastroenterology

## 2024-04-21 ENCOUNTER — Encounter: Payer: Self-pay | Admitting: Gastroenterology

## 2024-04-21 VITALS — BP 106/58 | HR 71 | Ht 68.0 in | Wt 156.2 lb

## 2024-04-21 DIAGNOSIS — Z860100 Personal history of colon polyps, unspecified: Secondary | ICD-10-CM | POA: Diagnosis not present

## 2024-04-21 DIAGNOSIS — Z01818 Encounter for other preprocedural examination: Secondary | ICD-10-CM

## 2024-04-21 MED ORDER — NA SULFATE-K SULFATE-MG SULF 17.5-3.13-1.6 GM/177ML PO SOLN: 1.0000 | Freq: Once | ORAL | 0 refills | Status: AC

## 2024-04-21 NOTE — Progress Notes (Signed)
 HPI :  78 year old male with a history of colon polyps, spinal stenosis, cutaneous squamous cell carcinoma, here to reestablish care and discuss surveillance colonoscopy for history of colon polyps.  He was last seen in June 2018 for screening colonoscopy.  He had one 5 mm adenoma removed and diverticulosis.  He generally feels well without any gastrointestinal complaints.  Denies any troubles with his bowel habits which she states are regular.  No blood in his stools.  No recurrent abdominal pains.  He sometimes has occasional gas pains that he attributes to caffeine ingestion.  He denies any family history of colon cancer.  He has no anemia, his recent labs look good.  He denies any cardiopulmonary symptoms.  He has no significant cardiopulmonary comorbidities and no personal history of significant malignancy.  He does have an inguinal hernia on the left side which is monitoring, has seen surgery and elected to monitor for now and avoid surgery if possible.  He denies any problems with anesthesia in the past.  We discussed that his age if he wanted to proceed with surveillance colonoscopy or not, and when to stop doing colonoscopy  Colonoscopy 03/27/17: - One 5 mm polyp in the transverse colon, removed with a cold snare. Resected and retrieved. - Diverticulosis in the sigmoid colon. - The examination was otherwise normal.  Surgical [P], transverse, polyp - TUBULAR ADENOMA (ONE FRAGMENT). - NO HIGH GRADE DYSPLASIA OR MALIGNANCY.   Echo 10/11/21: IMPRESSIONS   1. Left ventricular ejection fraction, by estimation, is 60 to 65%. The  left ventricle has normal function. The left ventricle has no regional  wall motion abnormalities. Left ventricular diastolic parameters were  normal.   2. Right ventricular systolic function is normal. The right ventricular  size is normal. There is normal pulmonary artery systolic pressure.   3. The mitral valve is normal in structure. Mild mitral valve   regurgitation.   4. The aortic valve is normal in structure. Aortic valve regurgitation is  mild. No aortic stenosis is present.       Past Medical History:  Diagnosis Date   Blood transfusion without reported diagnosis    years ago with surgery    Cervical radiculopathy, chronic    Sensorineural hearing loss, bilateral    Spinal stenosis, cervical region    Spinal stenosis, lumbar region without neurogenic claudication    Squamous cell carcinoma of skin of face      Past Surgical History:  Procedure Laterality Date   ANKLE SURGERY     right   COLONOSCOPY     ENT     nasal polyp removal    SHOULDER SURGERY     left   Family History  Problem Relation Age of Onset   Heart disease Mother    Cancer Father        prostate   Colon cancer Neg Hx    Colon polyps Neg Hx    Esophageal cancer Neg Hx    Rectal cancer Neg Hx    Stomach cancer Neg Hx    Social History   Tobacco Use   Smoking status: Never   Smokeless tobacco: Never  Vaping Use   Vaping status: Never Used  Substance Use Topics   Alcohol use: Yes    Comment: social   Drug use: No   Current Outpatient Medications  Medication Sig Dispense Refill   finasteride  (PROPECIA ) 1 MG tablet TAKE ONE TABLET BY MOUTH ONCE A DAY 90 tablet 1   ibuprofen (  ADVIL,MOTRIN) 200 MG tablet Take 400 mg by mouth every 6 (six) hours as needed for pain.     Multiple Vitamin (MULTIVITAMIN) tablet Take 1 tablet by mouth daily.     Omega-3 Fatty Acids (FISH OIL) 1000 MG CAPS Take 1 capsule by mouth daily.     rosuvastatin  (CRESTOR ) 5 MG tablet Take 1 tablet (5 mg total) by mouth daily. 90 tablet 3   No current facility-administered medications for this visit.   No Known Allergies   Review of Systems: All systems reviewed and negative except where noted in HPI.   Lab Results  Component Value Date   WBC 5.3 12/17/2023   HGB 14.0 12/17/2023   HCT 42.3 12/17/2023   MCV 96.0 12/17/2023   PLT 181.0 12/17/2023      Physical Exam: BP (!) 106/58   Pulse 71   Ht 5' 8 (1.727 m)   Wt 156 lb 4 oz (70.9 kg)   BMI 23.76 kg/m  Constitutional: Pleasant,well-developed, male in no acute distress. HEENT: Normocephalic and atraumatic. Conjunctivae are normal. No scleral icterus. Neck supple.  Cardiovascular: Normal rate, regular rhythm.  Pulmonary/chest: Effort normal and breath sounds normal.  Abdominal: Soft, nondistended, nontender.  There are no masses palpable. Extremities: no edema Lymphadenopathy: No cervical adenopathy noted. Neurological: Alert and oriented to person place and time. Skin: Skin is warm and dry. No rashes noted. Psychiatric: Normal mood and affect. Behavior is normal.   ASSESSMENT: 78 y.o. male here for assessment of the following  1. History of colon polyps    Nonadvanced adenoma removed 7 years ago.  He is due for surveillance colonoscopy at this time if he wants to proceed with that, in light of his age.  We discussed colonoscopy, risks and benefits of the procedure and anesthesia.  We discussed at his age if he wanted to continue to have surveillance exams.  Without a history of colon polyps, no family history, no symptoms, we discussed we often stop at age 2.  We often offer the exam later than that based on medical history, results of prior exams etc.  He is very healthy, life expectancy could easily be more than another 10 years.  He has no significant cardiopulmonary comorbidities.  In this light I offered him a surveillance colonoscopy.  If no high risk lesions on this exam, then this would likely be his last.  Following full discussion of this he wants to proceed with colonoscopy.  Further recommendations pending results.  Marcey Naval, MD [facility]  CC: Theophilus Andrews, [place]*

## 2024-04-21 NOTE — Patient Instructions (Addendum)
 You have been scheduled for a colonoscopy on September 24th. Please follow written instructions given to you at your visit today.   If you use inhalers (even only as needed), please bring them with you on the day of your procedure.  DO NOT TAKE 7 DAYS PRIOR TO TEST- Trulicity (dulaglutide) Ozempic, Wegovy (semaglutide) Mounjaro (tirzepatide) Bydureon Bcise (exanatide extended release)  DO NOT TAKE 1 DAY PRIOR TO YOUR TEST Rybelsus (semaglutide) Adlyxin (lixisenatide) Victoza (liraglutide) Byetta (exanatide) ___________________________________________________________________________  Thank you for entrusting me with your care and for choosing [facility], Dr. Elspeth Naval    _______________________________________________________  If your blood pressure at your visit was 140/90 or greater, please contact your primary care physician to follow up on this.  _______________________________________________________  If you are age 41 or older, your body mass index should be between 23-30. Your Body mass index is 23.76 kg/m. If this is out of the aforementioned range listed, please consider follow up with your Primary Care Provider.  If you are age 90 or younger, your body mass index should be between 19-25. Your Body mass index is 23.76 kg/m. If this is out of the aformentioned range listed, please consider follow up with your Primary Care Provider.   ________________________________________________________  The [facility] providers would like to encourage you to use MYCHART to communicate with providers for non-urgent requests or questions.  Due to long hold times on the telephone, sending your provider a message by [facility] may be a faster and more efficient way to get a response.  Please allow 48 business hours for a response.  Please remember that this is for non-urgent requests.  _______________________________________________________  [facility] is using  a team-based approach to care.  Your team is made up of your doctor and two to three APPS. Our APPS (Nurse Practitioners and Physician Assistants) work with your physician to ensure care continuity for you. They are fully qualified to address your health concerns and develop a treatment plan. They communicate directly with your gastroenterologist to care for you. Seeing the Advanced Practice Practitioners on your physician's team can help you by facilitating care more promptly, often allowing for earlier appointments, access to diagnostic testing, procedures, and other specialty referrals.

## 2024-04-28 ENCOUNTER — Encounter: Payer: Self-pay | Admitting: Internal Medicine

## 2024-04-28 DIAGNOSIS — K4091 Unilateral inguinal hernia, without obstruction or gangrene, recurrent: Secondary | ICD-10-CM

## 2024-05-05 ENCOUNTER — Ambulatory Visit: Payer: Self-pay | Admitting: General Surgery

## 2024-05-05 DIAGNOSIS — K4090 Unilateral inguinal hernia, without obstruction or gangrene, not specified as recurrent: Secondary | ICD-10-CM | POA: Diagnosis not present

## 2024-05-11 DIAGNOSIS — L570 Actinic keratosis: Secondary | ICD-10-CM | POA: Diagnosis not present

## 2024-06-15 ENCOUNTER — Encounter: Payer: Self-pay | Admitting: Gastroenterology

## 2024-06-21 ENCOUNTER — Encounter: Payer: Self-pay | Admitting: Internal Medicine

## 2024-06-22 ENCOUNTER — Other Ambulatory Visit: Payer: Self-pay | Admitting: Internal Medicine

## 2024-06-23 ENCOUNTER — Encounter: Payer: Self-pay | Admitting: Gastroenterology

## 2024-06-23 ENCOUNTER — Ambulatory Visit: Admitting: Gastroenterology

## 2024-06-23 VITALS — BP 113/60 | HR 50 | Temp 97.2°F | Resp 11 | Ht 68.0 in | Wt 156.0 lb

## 2024-06-23 DIAGNOSIS — Z1211 Encounter for screening for malignant neoplasm of colon: Secondary | ICD-10-CM

## 2024-06-23 DIAGNOSIS — D122 Benign neoplasm of ascending colon: Secondary | ICD-10-CM | POA: Diagnosis not present

## 2024-06-23 DIAGNOSIS — D123 Benign neoplasm of transverse colon: Secondary | ICD-10-CM

## 2024-06-23 DIAGNOSIS — Z860101 Personal history of adenomatous and serrated colon polyps: Secondary | ICD-10-CM | POA: Diagnosis not present

## 2024-06-23 DIAGNOSIS — K5730 Diverticulosis of large intestine without perforation or abscess without bleeding: Secondary | ICD-10-CM | POA: Diagnosis not present

## 2024-06-23 DIAGNOSIS — K648 Other hemorrhoids: Secondary | ICD-10-CM | POA: Diagnosis not present

## 2024-06-23 DIAGNOSIS — D120 Benign neoplasm of cecum: Secondary | ICD-10-CM

## 2024-06-23 DIAGNOSIS — Z860100 Personal history of colon polyps, unspecified: Secondary | ICD-10-CM

## 2024-06-23 MED ORDER — SODIUM CHLORIDE 0.9 % IV SOLN: 500.0000 mL | Freq: Once | INTRAVENOUS | Status: DC

## 2024-06-23 NOTE — Progress Notes (Signed)
 Pt's states no medical or surgical changes since previsit or office visit.

## 2024-06-23 NOTE — Progress Notes (Signed)
 Called to room to assist during endoscopic procedure.  Patient ID and intended procedure confirmed with present staff. Received instructions for my participation in the procedure from the performing physician.

## 2024-06-23 NOTE — Progress Notes (Signed)
 Sedate, gd SR, tolerated procedure well, VSS, report to RN

## 2024-06-23 NOTE — Progress Notes (Signed)
 [facility] History and Physical   Primary Care Physician:  Edward Berger, Tully GRADE, MD   Reason for Procedure:   History of colon polyps  Plan:    colonoscopy     HPI: Edward Berger is a 78 y.o. male  here for colonoscopy surveillance - adenoma removed 02/2017. Patient denies any bowel symptoms at this time. No family history of colon cancer known. Otherwise feels well without any cardiopulmonary symptoms. Wishes to proceed with colonoscopy.  I have discussed risks / benefits of anesthesia and endoscopic procedure with Edward Berger and they wish to proceed with the exams as outlined today.    Past Medical History:  Diagnosis Date   Blood transfusion without reported diagnosis    years ago with surgery    Cervical radiculopathy, chronic    Sensorineural hearing loss, bilateral    Spinal stenosis, cervical region    Spinal stenosis, lumbar region without neurogenic claudication    Squamous cell carcinoma of skin of face     Past Surgical History:  Procedure Laterality Date   ANKLE SURGERY     right   COLONOSCOPY     ENT     nasal polyp removal    SHOULDER SURGERY     left    Prior to Admission medications   Medication Sig Start Date End Date Taking? Authorizing Provider  finasteride  (PROPECIA ) 1 MG tablet TAKE ONE TABLET BY MOUTH ONCE A DAY 06/22/24  Yes Edward Berger, Tully GRADE, MD  ibuprofen (ADVIL,MOTRIN) 200 MG tablet Take 400 mg by mouth every 6 (six) hours as needed for pain.   Yes [provider]  Multiple Vitamin (MULTIVITAMIN) tablet Take 1 tablet by mouth daily.   Yes [provider]  Omega-3 Fatty Acids (FISH OIL) 1000 MG CAPS Take 1 capsule by mouth daily.   Yes [provider]  rosuvastatin  (CRESTOR ) 5 MG tablet Take 1 tablet (5 mg total) by mouth daily. 08/04/23  Yes Edward Berger Tully GRADE, MD    Current Outpatient Medications  Medication Sig Dispense Refill   finasteride  (PROPECIA ) 1 MG tablet TAKE  ONE TABLET BY MOUTH ONCE A DAY 90 tablet 0   ibuprofen (ADVIL,MOTRIN) 200 MG tablet Take 400 mg by mouth every 6 (six) hours as needed for pain.     Multiple Vitamin (MULTIVITAMIN) tablet Take 1 tablet by mouth daily.     Omega-3 Fatty Acids (FISH OIL) 1000 MG CAPS Take 1 capsule by mouth daily.     rosuvastatin  (CRESTOR ) 5 MG tablet Take 1 tablet (5 mg total) by mouth daily. 90 tablet 3   Current Facility-Administered Medications  Medication Dose Route Frequency Provider Last Rate Last Admin   0.9 %  sodium chloride  infusion  500 mL Intravenous Once Gitel Beste, Elspeth SQUIBB, MD        Allergies as of 06/23/2024   (No Known Allergies)    Family History  Problem Relation Age of Onset   Heart disease Mother    Cancer Father        prostate   Colon cancer Neg Hx    Colon polyps Neg Hx    Esophageal cancer Neg Hx    Rectal cancer Neg Hx    Stomach cancer Neg Hx     Social History   Socioeconomic History   Marital status: Married    Spouse name: Not on file   Number of children: Not on file   Years of education: Not on file   Highest education level: Master's  degree (e.g., MA, MS, MEng, MEd, MSW, MBA)  Occupational History   Not on file  Tobacco Use   Smoking status: Never   Smokeless tobacco: Never  Vaping Use   Vaping status: Never Used  Substance and Sexual Activity   Alcohol use: Yes    Comment: social   Drug use: No   Sexual activity: Not on file  Other Topics Concern   Not on file  Social History Narrative   Not on file   Social Drivers of Health   Financial Resource Strain: Low Risk  (11/24/2023)   Overall Financial Resource Strain (CARDIA)    Difficulty of Paying Living Expenses: Not hard at all  Food Insecurity: No Food Insecurity (11/24/2023)   Hunger Vital Sign    Worried About Running Out of Food in the Last Year: Never true    Ran Out of Food in the Last Year: Never true  Transportation Needs: No Transportation Needs (11/24/2023)   PRAPARE -  Administrator, Civil Service (Medical): No    Lack of Transportation (Non-Medical): No  Physical Activity: Sufficiently Active (11/24/2023)   Exercise Vital Sign    Days of Exercise per Week: 5 days    Minutes of Exercise per Session: 60 min  Stress: No Stress Concern Present (11/24/2023)   [employer] of Occupational Health - Occupational Stress Questionnaire    Feeling of Stress : Not at all  Social Connections: Unknown (11/24/2023)   Social Connection and Isolation Panel    Frequency of Communication with Friends and Family: Once a week    Frequency of Social Gatherings with Friends and Family: Twice a week    Attends Religious Services: Patient declined    Database administrator or Organizations: No    Attends Engineer, structural: Not on file    Marital Status: Married  Catering manager Violence: Not At Risk (11/20/2022)   Humiliation, Afraid, Rape, and Kick questionnaire    Fear of Current or Ex-Partner: No    Emotionally Abused: No    Physically Abused: No    Sexually Abused: No    Review of Systems: All other review of systems negative except as mentioned in the HPI.  Physical Exam: Vital signs BP 130/77   Pulse (!) 54   Temp (!) 97.2 F (36.2 C) (Temporal)   Ht 5' 8 (1.727 m)   Wt 156 lb (70.8 kg)   SpO2 99%   BMI 23.72 kg/m   General:   Alert,  Well-developed, pleasant and cooperative in NAD Lungs:  Clear throughout to auscultation.   Heart:  Regular rate and rhythm Abdomen:  Soft, nontender and nondistended.   Neuro/Psych:  Alert and cooperative. Normal mood and affect. A and O x 3  Marcey Naval, MD [facility]

## 2024-06-23 NOTE — Patient Instructions (Addendum)

## 2024-06-23 NOTE — Op Note (Signed)
  [facility] Patient Name: Edward Berger Procedure Date: 06/23/2024 8:55 AM MRN: [id number] Endoscopist: Elspeth P. Leigh , MD, [id number] Age: 78 Referring MD:  Date of Birth: 08/07/1946 Gender: Male Account #: [id number] Procedure:                Colonoscopy Indications:              High risk colon cancer surveillance: Personal                            history of colonic polyps - adenoma removed 02/2017 Medicines:                Monitored Anesthesia Care Procedure:                Pre-Anesthesia Assessment:                           - Prior to the procedure, a History and Physical                            was performed, and patient medications and                            allergies were reviewed. The patient's tolerance of                            previous anesthesia was also reviewed. The risks                            and benefits of the procedure and the sedation                            options and risks were discussed with the patient.                            All questions were answered, and informed consent                            was obtained. Prior Anticoagulants: The patient has                            taken no anticoagulant or antiplatelet agents. ASA                            Grade Assessment: II - A patient with mild systemic                            disease. After reviewing the risks and benefits,                            the patient was deemed in satisfactory condition to                            undergo the procedure.  After obtaining informed consent, the colonoscope                            was passed under direct vision. Throughout the                            procedure, the patient's blood pressure, pulse, and                            oxygen saturations were monitored continuously. The                            [id number]-HQ190L Colonoscope [id number] was introduced                            through  the anus and advanced to the the cecum,                            identified by appendiceal orifice and ileocecal                            valve. The colonoscopy was performed without                            difficulty. The patient tolerated the procedure                            well. The quality of the bowel preparation was                            good. The ileocecal valve, appendiceal orifice, and                            rectum were photographed. Scope In: 9:02:51 AM Scope Out: 9:24:17 AM Scope Withdrawal Time: 0 hours 17 minutes 14 seconds  Total Procedure Duration: 0 hours 21 minutes 26 seconds  Findings:                 The perianal and digital rectal examinations were                            normal.                           Two sessile polyps were found in the cecum. The                            polyps were 1 to 3 mm in size. These polyps were                            removed with a cold snare. Resection and retrieval                            were complete.  A 3 mm polyp was found in the ascending colon. The                            polyp was sessile. The polyp was removed with a                            cold snare. Resection and retrieval were complete.                           A 4 mm polyp was found in the transverse colon. The                            polyp was flat. The polyp was removed with a cold                            snare. Resection and retrieval were complete.                           Scattered small-mouthed diverticula were found in                            the transverse colon and left colon.                           Internal hemorrhoids were found during                            retroflexion. The hemorrhoids were small.                           The exam was otherwise without abnormality. Complications:            No immediate complications. Estimated blood loss:                             Minimal. Estimated Blood Loss:     Estimated blood loss was minimal. Impression:               - Two 1 to 3 mm polyps in the cecum, removed with a                            cold snare. Resected and retrieved.                           - One 3 mm polyp in the ascending colon, removed                            with a cold snare. Resected and retrieved.                           - One 4 mm polyp in the transverse colon, removed  with a cold snare. Resected and retrieved.                           - Diverticulosis in the transverse colon and in the                            left colon.                           - Internal hemorrhoids.                           - The examination was otherwise normal. Recommendation:           - Patient has a contact number available for                            emergencies. The signs and symptoms of potential                            delayed complications were discussed with the                            patient. Return to normal activities tomorrow.                            Written discharge instructions were provided to the                            patient.                           - Resume previous diet.                           - Continue present medications.                           - Await pathology results. Likely no further                            surveillance exams are needed given no high risk                            findings on this exam and the patient's age. Elspeth P. Adrena Nakamura, MD 06/23/2024 9:34:38 AM This report has been signed electronically.

## 2024-06-24 ENCOUNTER — Telehealth: Payer: Self-pay

## 2024-06-24 NOTE — Telephone Encounter (Signed)
 Attempted f/u call. No answer, no VM setup.

## 2024-06-30 LAB — SURGICAL PATHOLOGY

## 2024-07-04 ENCOUNTER — Ambulatory Visit: Payer: Self-pay | Admitting: Gastroenterology

## 2024-07-26 ENCOUNTER — Other Ambulatory Visit: Payer: Self-pay | Admitting: Internal Medicine

## 2024-09-02 ENCOUNTER — Encounter: Payer: Self-pay | Admitting: Internal Medicine

## 2024-09-08 DIAGNOSIS — H52203 Unspecified astigmatism, bilateral: Secondary | ICD-10-CM | POA: Diagnosis not present

## 2024-09-08 DIAGNOSIS — H2513 Age-related nuclear cataract, bilateral: Secondary | ICD-10-CM | POA: Diagnosis not present

## 2024-09-28 ENCOUNTER — Other Ambulatory Visit: Payer: Self-pay | Admitting: Internal Medicine

## 2024-10-06 NOTE — Pre-Procedure Instructions (Signed)
 Surgical Instructions   Your procedure is scheduled on October 11, 2024. Report to [facility] Main Entrance A at 6:30 A.M., then check in with the Admitting office. Any questions or running late day of surgery: call [phone]  Questions prior to your surgery date: call [phone], Monday-Friday, 8am-4pm. If you experience any cold or flu symptoms such as cough, fever, chills, shortness of breath, etc. between now and your scheduled surgery, please notify us  at the above number.     Remember:  Do not eat after midnight the night before your surgery   You may drink clear liquids until 5:30AM the morning of your surgery.   Clear liquids allowed are: Water, Non-Citrus Juices (without pulp), Carbonated Beverages, Clear Tea (no milk, honey, etc.), Black Coffee Only (NO MILK, CREAM OR POWDERED CREAMER of any kind), and Gatorade.    Take these medicines the morning of surgery with A SIP OF WATER: finasteride  (PROPECIA )  rosuvastatin  (CRESTOR )   May take these medicines IF NEEDED:    One week prior to surgery, STOP taking any Aspirin (unless otherwise instructed by your surgeon) Aleve, Naproxen, Ibuprofen, Motrin, Advil, Goody's, BC's, all herbal medications, fish oil, and non-prescription vitamins.                     Do NOT Smoke (Tobacco/Vaping) for 24 hours prior to your procedure.  If you use a CPAP at night, you may bring your mask/headgear for your overnight stay.   You will be asked to remove any contacts, glasses, piercing's, hearing aid's, dentures/partials prior to surgery. Please bring cases for these items if needed.    Patients discharged the day of surgery will not be allowed to drive home, and someone needs to stay with them for 24 hours.  SURGICAL WAITING ROOM VISITATION Patients may have no more than 2 support people in the waiting area - these visitors may rotate.   Pre-op nurse will coordinate an appropriate time for 1 ADULT support person, who may not  rotate, to accompany patient in pre-op.  Children under the age of 69 must have an adult with them who is not the patient and must remain in the main waiting area with an adult.  If the patient needs to stay at the hospital during part of their recovery, the visitor guidelines for inpatient rooms apply.  Please refer to the [facility] website for the visitor guidelines for any additional information.   If you received a COVID test during your pre-op visit  it is requested that you wear a mask when out in public, stay away from anyone that may not be feeling well and notify your surgeon if you develop symptoms. If you have been in contact with anyone that has tested positive in the last 10 days please notify you surgeon.      Pre-operative CHG Bathing Instructions   You can play a key role in reducing the risk of infection after surgery. Your skin needs to be as free of germs as possible. You can reduce the number of germs on your skin by washing with CHG (chlorhexidine gluconate) soap before surgery. CHG is an antiseptic soap that kills germs and continues to kill germs even after washing.   DO NOT use if you have an allergy to chlorhexidine/CHG or antibacterial soaps. If your skin becomes reddened or irritated, stop using the CHG and notify one of our RNs at [phone].  TAKE A SHOWER THE NIGHT BEFORE SURGERY   Please keep in mind the following:  DO NOT shave, including legs and underarms, 48 hours prior to surgery.   You may shave your face before/day of surgery.  Place clean sheets on your bed the night before surgery Use a clean washcloth (not used since being washed) for shower. DO NOT sleep with pet's night before surgery.  CHG Shower Instructions:  Wash your face and private area with normal soap. If you choose to wash your hair, wash first with your normal shampoo.  After you use shampoo/soap, rinse your hair and body thoroughly to remove shampoo/soap residue.   Turn the water OFF and apply half the bottle of CHG soap to a CLEAN washcloth.  Apply CHG soap ONLY FROM YOUR NECK DOWN TO YOUR TOES (washing for 3-5 minutes)  DO NOT use CHG soap on face, private areas, open wounds, or sores.  Pay special attention to the area where your surgery is being performed.  If you are having back surgery, having someone wash your back for you may be helpful. Wait 2 minutes after CHG soap is applied, then you may rinse off the CHG soap.  Pat dry with a clean towel  Put on clean pajamas    Additional instructions for the day of surgery: If you choose, you may shower the morning of surgery with an antibacterial soap.  DO NOT APPLY any lotions, deodorants, cologne, or perfumes.   Do not wear jewelry or makeup Do not wear nail polish, gel polish, artificial nails, or any other type of covering on natural nails (fingers and toes) Do not bring valuables to the hospital. [facility] is not responsible for valuables/personal belongings. Put on clean/comfortable clothes.  Please brush your teeth.  Ask your nurse before applying any prescription medications to the skin.

## 2024-10-07 ENCOUNTER — Other Ambulatory Visit: Payer: Self-pay

## 2024-10-07 ENCOUNTER — Encounter (HOSPITAL_COMMUNITY): Payer: Self-pay

## 2024-10-07 ENCOUNTER — Encounter (HOSPITAL_COMMUNITY)
Admission: RE | Admit: 2024-10-07 | Discharge: 2024-10-07 | Disposition: A | Discharge status: Home / Self Care | Source: Ambulatory Visit | Attending: General Surgery | Admitting: General Surgery

## 2024-10-07 VITALS — BP 137/79 | HR 62 | Temp 98.5°F | Resp 18 | Ht 68.0 in | Wt 154.4 lb

## 2024-10-07 DIAGNOSIS — Z01812 Encounter for preprocedural laboratory examination: Secondary | ICD-10-CM | POA: Diagnosis present

## 2024-10-07 DIAGNOSIS — Z8673 Personal history of transient ischemic attack (TIA), and cerebral infarction without residual deficits: Secondary | ICD-10-CM | POA: Insufficient documentation

## 2024-10-07 DIAGNOSIS — J3489 Other specified disorders of nose and nasal sinuses: Secondary | ICD-10-CM | POA: Diagnosis not present

## 2024-10-07 DIAGNOSIS — M4802 Spinal stenosis, cervical region: Secondary | ICD-10-CM | POA: Insufficient documentation

## 2024-10-07 DIAGNOSIS — M48061 Spinal stenosis, lumbar region without neurogenic claudication: Secondary | ICD-10-CM | POA: Insufficient documentation

## 2024-10-07 DIAGNOSIS — Z01818 Encounter for other preprocedural examination: Secondary | ICD-10-CM

## 2024-10-07 LAB — BASIC METABOLIC PANEL WITH GFR
Anion gap: 8 (ref 5–15)
BUN: 18 mg/dL (ref 8–23)
CO2: 29 mmol/L (ref 22–32)
Calcium: 9.6 mg/dL (ref 8.9–10.3)
Chloride: 102 mmol/L (ref 98–111)
Creatinine, Ser: 1.1 mg/dL (ref 0.61–1.24)
GFR, Estimated: >60 mL/min
Glucose, Bld: 101 mg/dL — ABNORMAL HIGH (ref 70–99)
Potassium: 4.2 mmol/L (ref 3.5–5.1)
Sodium: 140 mmol/L (ref 135–145)

## 2024-10-07 LAB — CBC
HCT: 44.5 % (ref 39.0–52.0)
Hemoglobin: 14.8 g/dL (ref 13.0–17.0)
MCH: 32.5 pg (ref 26.0–34.0)
MCHC: 33.3 g/dL (ref 30.0–36.0)
MCV: 97.6 fL (ref 80.0–100.0)
Platelets: 151 K/uL (ref 150–400)
RBC: 4.56 MIL/uL (ref 4.22–5.81)
RDW: 12.8 % (ref 11.5–15.5)
WBC: 6.4 K/uL (ref 4.0–10.5)
nRBC: 0 % (ref 0.0–0.2)

## 2024-10-07 NOTE — Pre-Procedure Instructions (Signed)
 Surgical Instructions   Your procedure is scheduled on October 11, 2024. Report to [facility] Main Entrance A at 6:30 A.M., then check in with the Admitting office. Any questions or running late day of surgery: call [phone]  Questions prior to your surgery date: call [phone], Monday-Friday, 8am-4pm. If you experience any cold or flu symptoms such as cough, fever, chills, shortness of breath, etc. between now and your scheduled surgery, please notify us  at the above number.     Remember:  Do not eat after midnight the night before your surgery   You may drink clear liquids until 5:30AM the morning of your surgery.   Clear liquids allowed are: Water, Non-Citrus Juices (without pulp), Carbonated Beverages, Clear Tea (no milk, honey, etc.), Black Coffee Only (NO MILK, CREAM OR POWDERED CREAMER of any kind), and Gatorade.    Take these medicines the morning of surgery with A SIP OF WATER: finasteride  (PROPECIA )  rosuvastatin  (CRESTOR )     One week prior to surgery, STOP taking any Aspirin (unless otherwise instructed by your surgeon) Aleve, Naproxen, Ibuprofen, Motrin, Advil, Goody's, BC's, all herbal medications, fish oil, and non-prescription vitamins.                     Do NOT Smoke (Tobacco/Vaping) for 24 hours prior to your procedure.  If you use a CPAP at night, you may bring your mask/headgear for your overnight stay.   You will be asked to remove any contacts, glasses, piercing's, hearing aid's, dentures/partials prior to surgery. Please bring cases for these items if needed.    Patients discharged the day of surgery will not be allowed to drive home, and someone needs to stay with them for 24 hours.  SURGICAL WAITING ROOM VISITATION Patients may have no more than 2 support people in the waiting area - these visitors may rotate.   Pre-op nurse will coordinate an appropriate time for 2 ADULT support persons, who may not rotate, to accompany patient in pre-op.   Children under the age of 13 must have an adult with them who is not the patient and must remain in the main waiting area with an adult.  If the patient needs to stay at the hospital during part of their recovery, the visitor guidelines for inpatient rooms apply.  Please refer to the [facility] website for the visitor guidelines for any additional information.   If you received a COVID test during your pre-op visit  it is requested that you wear a mask when out in public, stay away from anyone that may not be feeling well and notify your surgeon if you develop symptoms. If you have been in contact with anyone that has tested positive in the last 10 days please notify you surgeon.      Pre-operative CHG Bathing Instructions   You can play a key role in reducing the risk of infection after surgery. Your skin needs to be as free of germs as possible. You can reduce the number of germs on your skin by washing with CHG (chlorhexidine  gluconate) soap before surgery. CHG is an antiseptic soap that kills germs and continues to kill germs even after washing.   DO NOT use if you have an allergy to chlorhexidine /CHG or antibacterial soaps. If your skin becomes reddened or irritated, stop using the CHG and notify one of our RNs at [phone].              TAKE A SHOWER THE NIGHT BEFORE SURGERY  Please keep in mind the following:  DO NOT shave, including legs and underarms, 48 hours prior to surgery.   You may shave your face before/day of surgery.  Place clean sheets on your bed the night before surgery Use a clean washcloth (not used since being washed) for shower. DO NOT sleep with pet's night before surgery.  CHG Shower Instructions:  Wash your face and private area with normal soap. If you choose to wash your hair, wash first with your normal shampoo.  After you use shampoo/soap, rinse your hair and body thoroughly to remove shampoo/soap residue.  Turn the water OFF and apply half the  bottle of CHG soap to a CLEAN washcloth.  Apply CHG soap ONLY FROM YOUR NECK DOWN TO YOUR TOES (washing for 3-5 minutes)  DO NOT use CHG soap on face, private areas, open wounds, or sores.  Pay special attention to the area where your surgery is being performed.  If you are having back surgery, having someone wash your back for you may be helpful. Wait 2 minutes after CHG soap is applied, then you may rinse off the CHG soap.  Pat dry with a clean towel  Put on clean pajamas    Additional instructions for the day of surgery: If you choose, you may shower the morning of surgery with an antibacterial soap.  DO NOT APPLY any lotions, deodorants, cologne, or perfumes.   Do not wear jewelry or makeup Do not wear nail polish, gel polish, artificial nails, or any other type of covering on natural nails (fingers and toes) Do not bring valuables to the hospital. [facility] is not responsible for valuables/personal belongings. Put on clean/comfortable clothes.  Please brush your teeth.  Ask your nurse before applying any prescription medications to the skin.

## 2024-10-07 NOTE — Progress Notes (Signed)
 PCP - Dr. Tully Ly Cardiologist - denies  PPM/ICD - denies   Chest x-ray - 12/20/16 EKG - 04/18/20 (n/a) Stress Test - 10+ years ago per pt, at [facility]- normal per pt ECHO - 10/11/21 Cardiac Cath - denies  Sleep Study - denies   DM- denies  Last dose of GLP1 agonist-  n/a   ASA/Blood Thinner Instructions: n/a   ERAS Protcol - clears until 0530   COVID TEST- n/a   Anesthesia review: yes, Lynwood saw pt in PAT due to possible URI (pt has runny nose and dry cough)  Patient denies shortness of breath, fever, cough and chest pain at PAT appointment   All instructions explained to the patient, with a verbal understanding of the material. Patient agrees to go over the instructions while at home for a better understanding. The opportunity to ask questions was provided.

## 2024-10-08 NOTE — Anesthesia Preprocedure Evaluation (Signed)
"                                    Anesthesia Evaluation    Reviewed: Allergy & Precautions, H&P , Patient's Chart, lab work & pertinent test results  Airway Mallampati: II  TM Distance: >3 FB Neck ROM: Full    Dental no notable dental hx.    Pulmonary neg pulmonary ROS   Pulmonary exam normal breath sounds clear to auscultation       Cardiovascular Exercise Tolerance: Good negative cardio ROS Normal cardiovascular exam Rhythm:Regular Rate:Normal     Neuro/Psych TIA Neuromuscular disease  negative psych ROS   GI/Hepatic negative GI ROS, Neg liver ROS,,,  Endo/Other  negative endocrine ROS    Renal/GU negative Renal ROS  negative genitourinary   Musculoskeletal   Abdominal   Peds  Hematology negative hematology ROS (+)   Anesthesia Other Findings   Reproductive/Obstetrics negative OB ROS                              Anesthesia Physical Anesthesia Plan  ASA: 2  Anesthesia Plan: General   Post-op Pain Management: Tylenol  PO (pre-op)* and Celebrex  PO (pre-op)*   Induction: Intravenous  PONV Risk Score and Plan: 2  Airway Management Planned: Oral ETT  Additional Equipment:   Intra-op Plan:   Post-operative Plan: Extubation in OR  Informed Consent: I have reviewed the patients History and Physical, chart, labs and discussed the procedure including the risks, benefits and alternatives for the proposed anesthesia with the patient or authorized representative who has indicated his/her understanding and acceptance.       Plan Discussed with:   Anesthesia Plan Comments: (PAT note by Lynwood Hope, PA-C: 79 year old male with pertinent history including HLD, TIA, cervical and lumbar spinal stenosis.  Echocardiogram 09/2021 showed LVEF 60 to 65%, normal diastolic parameters, normal RV systolic function.  I spoke with patient at preadmission testing appointment on 10/07/2024 as he reported runny nose and slight cough.   He said he has had clear rhinorrhea for a couple days and what feels like a postnasal drip related cough.  Cough is nonproductive.  He denies any shortness of breath, denies any fever.  He has not had any sick contacts that he knows of.  Overall he feels well.  On exam he is well-appearing, no shortness of breath, lungs CTAB, heart regular rate and rhythm, no murmurs.  His primary complaint at this point is mild rhinorrhea.  I do not feel that the symptoms would preclude surgery, however, I did advise him that I would call tomorrow to follow-up to make sure that these were not evolving into a URI.  I did speak with the patient by phone at 10/08/2024 and he stated that his symptoms essentially resolved at this point, no longer coughing, minimal rhinorrhea.  I anticipate he can proceed as planned barring acute status change.  Preop labs reviewed, WNL.  )         Anesthesia Quick Evaluation  "

## 2024-10-08 NOTE — Progress Notes (Signed)
 Anesthesia Chart Review:  79 year old male with pertinent history including HLD, TIA, cervical and lumbar spinal stenosis.  Echocardiogram 09/2021 showed LVEF 60 to 65%, normal diastolic parameters, normal RV systolic function.  I spoke with patient at preadmission testing appointment on 10/07/2024 as he reported runny nose and slight cough.  He said he has had clear rhinorrhea for a couple days and what feels like a postnasal drip related cough.  Cough is nonproductive.  He denies any shortness of breath, denies any fever.  He has not had any sick contacts that he knows of.  Overall he feels well.  On exam he is well-appearing, no shortness of breath, lungs CTAB, heart regular rate and rhythm, no murmurs.  His primary complaint at this point is mild rhinorrhea.  I do not feel that the symptoms would preclude surgery, however, I did advise him that I would call tomorrow to follow-up to make sure that these were not evolving into a URI.  I did speak with the patient by phone at 10/08/2024 and he stated that his symptoms essentially resolved at this point, no longer coughing, minimal rhinorrhea.  I anticipate he can proceed as planned barring acute status change.  Preop labs reviewed, WNL.    Edward [facility] Short Stay Center/Anesthesiology Phone [phone] 10/08/2024 3:21 PM

## 2024-10-11 ENCOUNTER — Encounter (HOSPITAL_COMMUNITY): Admission: RE | Disposition: A | Payer: Self-pay | Source: Ambulatory Visit | Attending: General Surgery

## 2024-10-11 ENCOUNTER — Encounter (HOSPITAL_COMMUNITY): Payer: Self-pay | Admitting: Physician Assistant

## 2024-10-11 ENCOUNTER — Ambulatory Visit (HOSPITAL_BASED_OUTPATIENT_CLINIC_OR_DEPARTMENT_OTHER): Payer: Self-pay | Admitting: Anesthesiology

## 2024-10-11 ENCOUNTER — Ambulatory Visit (HOSPITAL_COMMUNITY)
Admission: RE | Admit: 2024-10-11 | Discharge: 2024-10-11 | Disposition: A | Source: Ambulatory Visit | Attending: General Surgery | Admitting: General Surgery

## 2024-10-11 DIAGNOSIS — G709 Myoneural disorder, unspecified: Secondary | ICD-10-CM | POA: Diagnosis not present

## 2024-10-11 DIAGNOSIS — Z8673 Personal history of transient ischemic attack (TIA), and cerebral infarction without residual deficits: Secondary | ICD-10-CM | POA: Diagnosis not present

## 2024-10-11 DIAGNOSIS — K4090 Unilateral inguinal hernia, without obstruction or gangrene, not specified as recurrent: Secondary | ICD-10-CM

## 2024-10-11 HISTORY — PX: INGUINAL HERNIA REPAIR: SHX194

## 2024-10-11 SURGERY — REPAIR, HERNIA, INGUINAL, ADULT
Anesthesia: General | Site: Groin | Laterality: Left

## 2024-10-11 MED ORDER — ONDANSETRON HCL 4 MG/2ML IJ SOLN: 4.0000 mg | Freq: Once | INTRAMUSCULAR | Status: DC | PRN

## 2024-10-11 MED ORDER — LIDOCAINE 2% (20 MG/ML) 5 ML SYRINGE
INTRAMUSCULAR | Status: AC
  Filled 2024-10-11: qty 5

## 2024-10-11 MED ORDER — CHLORHEXIDINE GLUCONATE 0.12 % MT SOLN
OROMUCOSAL | Status: AC
  Administered 2024-10-11: 15 mL via OROMUCOSAL
  Filled 2024-10-11: qty 15

## 2024-10-11 MED ORDER — FENTANYL CITRATE (PF) 250 MCG/5ML IJ SOLN
INTRAMUSCULAR | Status: AC
  Filled 2024-10-11: qty 5

## 2024-10-11 MED ORDER — ONDANSETRON HCL 4 MG/2ML IJ SOLN
INTRAMUSCULAR | Status: DC | PRN
  Administered 2024-10-11: 4 mg via INTRAVENOUS

## 2024-10-11 MED ORDER — FENTANYL CITRATE (PF) 250 MCG/5ML IJ SOLN
INTRAMUSCULAR | Status: DC | PRN
  Administered 2024-10-11 (×3): 50 ug via INTRAVENOUS

## 2024-10-11 MED ORDER — BUPIVACAINE-EPINEPHRINE (PF) 0.25% -1:200000 IJ SOLN
INTRAMUSCULAR | Status: AC
  Filled 2024-10-11: qty 30

## 2024-10-11 MED ORDER — CEFAZOLIN SODIUM-DEXTROSE 2-3 GM-%(50ML) IV SOLR
INTRAVENOUS | Status: DC | PRN
  Administered 2024-10-11: 2 g via INTRAVENOUS

## 2024-10-11 MED ORDER — 0.9 % SODIUM CHLORIDE (POUR BTL) OPTIME
TOPICAL | Status: DC | PRN
  Administered 2024-10-11: 1000 mL

## 2024-10-11 MED ORDER — ONDANSETRON HCL 4 MG/2ML IJ SOLN
INTRAMUSCULAR | Status: AC
  Filled 2024-10-11: qty 2

## 2024-10-11 MED ORDER — OXYCODONE HCL 5 MG PO TABS: 5.0000 mg | ORAL_TABLET | Freq: Four times a day (QID) | ORAL | 0 refills | Status: AC | PRN

## 2024-10-11 MED ORDER — LIDOCAINE 2% (20 MG/ML) 5 ML SYRINGE
INTRAMUSCULAR | Status: DC | PRN
  Administered 2024-10-11: 100 mg via INTRAVENOUS

## 2024-10-11 MED ORDER — SUGAMMADEX SODIUM 200 MG/2ML IV SOLN
INTRAVENOUS | Status: DC | PRN
  Administered 2024-10-11: 150 mg via INTRAVENOUS

## 2024-10-11 MED ORDER — CELECOXIB 200 MG PO CAPS
ORAL_CAPSULE | ORAL | Status: AC
  Administered 2024-10-11: 200 mg via ORAL
  Filled 2024-10-11: qty 1

## 2024-10-11 MED ORDER — DEXAMETHASONE SOD PHOSPHATE PF 10 MG/ML IJ SOLN
INTRAMUSCULAR | Status: DC | PRN
  Administered 2024-10-11: 5 mg via INTRAVENOUS

## 2024-10-11 MED ORDER — DEXAMETHASONE SOD PHOSPHATE PF 10 MG/ML IJ SOLN
INTRAMUSCULAR | Status: AC
  Filled 2024-10-11: qty 1

## 2024-10-11 MED ORDER — CELECOXIB 200 MG PO CAPS: 200.0000 mg | ORAL_CAPSULE | Freq: Once | ORAL | Status: AC

## 2024-10-11 MED ORDER — MEPERIDINE HCL 25 MG/ML IJ SOLN: 6.2500 mg | INTRAMUSCULAR | Status: DC | PRN

## 2024-10-11 MED ORDER — ROCURONIUM BROMIDE 10 MG/ML (PF) SYRINGE
PREFILLED_SYRINGE | INTRAVENOUS | Status: AC
  Filled 2024-10-11: qty 10

## 2024-10-11 MED ORDER — ROCURONIUM BROMIDE 10 MG/ML (PF) SYRINGE
PREFILLED_SYRINGE | INTRAVENOUS | Status: DC | PRN
  Administered 2024-10-11: 50 mg via INTRAVENOUS

## 2024-10-11 MED ORDER — MIDAZOLAM HCL 2 MG/2ML IJ SOLN
INTRAMUSCULAR | Status: AC
  Filled 2024-10-11: qty 2

## 2024-10-11 MED ORDER — ACETAMINOPHEN 500 MG PO TABS
ORAL_TABLET | ORAL | Status: AC
  Administered 2024-10-11: 1000 mg via ORAL
  Filled 2024-10-11: qty 2

## 2024-10-11 MED ORDER — OXYCODONE HCL 5 MG/5ML PO SOLN: 5.0000 mg | Freq: Once | ORAL | Status: DC | PRN

## 2024-10-11 MED ORDER — FENTANYL CITRATE (PF) 100 MCG/2ML IJ SOLN: 25.0000 ug | INTRAMUSCULAR | Status: DC | PRN

## 2024-10-11 MED ORDER — OXYCODONE HCL 5 MG PO TABS: 5.0000 mg | ORAL_TABLET | Freq: Once | ORAL | Status: DC | PRN

## 2024-10-11 MED ORDER — LACTATED RINGERS IV SOLN: INTRAVENOUS | Status: DC

## 2024-10-11 MED ORDER — PROPOFOL 10 MG/ML IV BOLUS
INTRAVENOUS | Status: AC
  Filled 2024-10-11: qty 20

## 2024-10-11 MED ORDER — ORAL CARE MOUTH RINSE: 15.0000 mL | Freq: Once | OROMUCOSAL | Status: AC

## 2024-10-11 MED ORDER — BUPIVACAINE-EPINEPHRINE 0.25% -1:200000 IJ SOLN
INTRAMUSCULAR | Status: DC | PRN
  Administered 2024-10-11: 19 mL

## 2024-10-11 MED ORDER — PROPOFOL 10 MG/ML IV BOLUS
INTRAVENOUS | Status: DC | PRN
  Administered 2024-10-11: 150 mg via INTRAVENOUS

## 2024-10-11 MED ORDER — CHLORHEXIDINE GLUCONATE 0.12 % MT SOLN: 15.0000 mL | Freq: Once | OROMUCOSAL | Status: AC

## 2024-10-11 MED ORDER — BUPIVACAINE HCL (PF) 0.25 % IJ SOLN
INTRAMUSCULAR | Status: AC
  Filled 2024-10-11: qty 30

## 2024-10-11 MED ORDER — ACETAMINOPHEN 500 MG PO TABS: 1000.0000 mg | ORAL_TABLET | Freq: Once | ORAL | Status: AC

## 2024-10-11 SURGICAL SUPPLY — 27 items
BAG COUNTER SPONGE SURGICOUNT (BAG) ×1 IMPLANT
BLADE CLIPPER SURG (BLADE) IMPLANT
CHLORAPREP W/TINT 26 (MISCELLANEOUS) ×1 IMPLANT
COVER SURGICAL LIGHT HANDLE (MISCELLANEOUS) ×1 IMPLANT
DERMABOND ADVANCED .7 DNX12 (GAUZE/BANDAGES/DRESSINGS) ×1 IMPLANT
DRAIN PENROSE 0.5X18 (DRAIN) ×1 IMPLANT
DRAPE LAPAROSCOPIC ABDOMINAL (DRAPES) ×1 IMPLANT
ELECTRODE REM PT RTRN 9FT ADLT (ELECTROSURGICAL) ×1 IMPLANT
GLOVE BIO SURGEON STRL SZ7.5 (GLOVE) ×1 IMPLANT
GOWN STRL REUS W/ TWL LRG LVL3 (GOWN DISPOSABLE) ×2 IMPLANT
KIT BASIN OR (CUSTOM PROCEDURE TRAY) ×1 IMPLANT
KIT TURNOVER KIT B (KITS) ×1 IMPLANT
MESH ULTRAPRO 3X6 7.6X15CM (Mesh General) IMPLANT
NEEDLE HYPO 25GX1X1/2 BEV (NEEDLE) ×1 IMPLANT
PACK GENERAL/GYN (CUSTOM PROCEDURE TRAY) ×1 IMPLANT
PAD ARMBOARD POSITIONER FOAM (MISCELLANEOUS) ×2 IMPLANT
PENCIL SMOKE EVACUATOR (MISCELLANEOUS) ×1 IMPLANT
SOLN 0.9% NACL POUR BTL 1000ML (IV SOLUTION) ×1 IMPLANT
SUT MNCRL AB 4-0 PS2 18 (SUTURE) ×1 IMPLANT
SUT PROLENE 2 0 SH DA (SUTURE) ×2 IMPLANT
SUT SILK 3-0 18XBRD TIE 12 (SUTURE) ×1 IMPLANT
SUT VIC AB 0 CT1 27XBRD ANBCTR (SUTURE) ×1 IMPLANT
SUT VIC AB 2-0 SH 27X BRD (SUTURE) ×1 IMPLANT
SUT VIC AB 3-0 SH 27XBRD (SUTURE) ×1 IMPLANT
SYR CONTROL 10ML LL (SYRINGE) ×1 IMPLANT
TOWEL GREEN STERILE (TOWEL DISPOSABLE) ×1 IMPLANT
TOWEL GREEN STERILE FF (TOWEL DISPOSABLE) ×1 IMPLANT

## 2024-10-11 NOTE — Transfer of Care (Signed)
 Immediate Anesthesia Transfer of Care Note  Patient: Edward Berger  Procedure(s) Performed: REPAIR, HERNIA, INGUINAL, ADULT W/MESH (Left: Groin)  Patient Location: PACU  Anesthesia Type:General  Level of Consciousness: awake, alert , and oriented  Airway & Oxygen Therapy: Patient Spontanous Breathing and Patient connected to face mask oxygen  Post-op Assessment: Report given to RN and Post -op Vital signs reviewed and stable  Post vital signs: Reviewed and stable  Last Vitals:  Vitals Value Taken Time  BP 136/82 10/11/24 10:15  Temp 35.8 C 10/11/24 10:15  Pulse 57 10/11/24 10:18  Resp 9 10/11/24 10:18  SpO2 99 % 10/11/24 10:18  Vitals shown include unfiled device data.  Last Pain:  Vitals:   10/11/24 0813  TempSrc:   PainSc: 0-No pain         Complications: No notable events documented.

## 2024-10-11 NOTE — Anesthesia Postprocedure Evaluation (Signed)
"   Anesthesia Post Note  Patient: Edward Berger  Procedure(s) Performed: REPAIR, HERNIA, INGUINAL, ADULT W/MESH (Left: Groin)     Patient location during evaluation: PACU Anesthesia Type: General Level of consciousness: awake and alert Pain management: pain level controlled Vital Signs Assessment: post-procedure vital signs reviewed and stable Respiratory status: spontaneous breathing, nonlabored ventilation, respiratory function stable and patient connected to nasal cannula oxygen Cardiovascular status: blood pressure returned to baseline and stable Postop Assessment: no apparent nausea or vomiting Anesthetic complications: no   No notable events documented.  Last Vitals:  Vitals:   10/11/24 1030 10/11/24 1045  BP: (!) 144/69 135/69  Pulse: (!) 54 (!) 51  Resp: 12 16  Temp: 36.5 C 36.5 C  SpO2: 100% 100%    Last Pain:  Vitals:   10/11/24 1045  TempSrc:   PainSc: 0-No pain                 [name]      "

## 2024-10-11 NOTE — Op Note (Signed)
 10/11/2024  8:30 AM  10:02 AM  PATIENT:  Edward Berger  79 y.o. male  PRE-OPERATIVE DIAGNOSIS:  LEFT INGUINAL HERNIA  POST-OPERATIVE DIAGNOSIS:  LEFT INGUINAL HERNIA  PROCEDURE:  Procedures with comments: REPAIR, HERNIA, INGUINAL, ADULT W/MESH (Left) - LEFT INGUINAL HERNIA REPAIR WITH MESH  SURGEON:  Surgeons and Role:    * Curvin Deward MOULD, MD - Primary  PHYSICIAN ASSISTANT:   ASSISTANTS: Dr. Marguarite   ANESTHESIA:   local and general  EBL:  5 mL   BLOOD ADMINISTERED:none  DRAINS: none   LOCAL MEDICATIONS USED:  MARCAINE      SPECIMEN:  No Specimen  DISPOSITION OF SPECIMEN:  N/A  COUNTS:  YES  TOURNIQUET:  * No tourniquets in log *  DICTATION: .Dragon Dictation  After informed consent was obtained the patient was brought to the operating room and placed in the supine position on the operating table.  After adequate induction of general anesthesia the patient's abdomen and left groin were prepped with ChloraPrep, allowed to dry, and draped in usual sterile manner.  An appropriate timeout was performed.  The left groin was then infiltrated with quarter percent Marcaine .  An incision was then made from the edge of the pubic tubercle on the left towards the anterior superior iliac spine with a 15 blade knife.  The incision was carried through the skin and subcutaneous tissue sharply with the electrocautery until the fascia of the external oblique was encountered.  A small bridging vein was clamped with hemostats, divided, and ligated with 3-0 silk ties.  The external bleak fascia was opened along its fibers towards the apex of the external ring with a 15 blade knife and Metzenbaum scissors.  A wheat Lander retractor was deployed.  Blunt dissection was carried out of the cord structures until they could be surrounded between 2 fingers.  1/2 inch Penrose drain was placed around the cord structures for retraction purposes.  The cord was then gently skeletonized by blunt hemostat  dissection and sharp dissection with the electrocautery.  A small wide-based hernia was identified and separated from the rest of the cord structures.  Care was taken to avoid any injury to the vas deferens or testicular vessels.  The hernia was reduced and the floor of the canal was repaired with interrupted 0 Vicryl stitches.  Next a 3 x 6 piece of UltraPro mesh was chosen and cut to the appropriate size.  The mesh was sewed inferiorly to the shelving edge of the inguinal ligament with a running 2-0 Prolene stitch.  Tails were cut in the mesh laterally and the tails were wrapped around the cord structures.  Medially and superiorly the mesh was sewed to the aponeurotic strength layer of the transversalis with interrupted 2-0 Prolene vertical mattress stitches.  Lateral to the cord the tails of the mesh were anchored to the shelving edge of the inguinal ligament with interrupted 2-0 Prolene stitches.  During the dissection the ilioinguinal nerve was identified and involved with some scar tissue.  It was dissected out proximally and distally, clamped, divided, and ligated with 3-0 silk ties.  Once this dissection was complete and the mesh appeared to be in good position and the hernia seem well repaired.  The wound was irrigated with copious amounts of saline.  The external oblique fascia was then reapproximated with a running 2-0 Vicryl stitch.  The wound was then infiltrated with more quarter percent Marcaine .  The subcutaneous fascia was closed with a running 3-0 Vicryl stitch.  The  skin was closed with a running 4-0 Monocryl subcuticular stitch.  Dermabond dressings were applied.  The patient tolerated the procedure well.  At the end of the case all needle sponge and instrument counts were correct.  The patient was then awakened and taken to recovery in stable condition.  The patient's testicle was in his scrotum at the end of the case.  PLAN OF CARE: Discharge to home after PACU  PATIENT DISPOSITION:  PACU -  hemodynamically stable.   Delay start of Pharmacological VTE agent (>24hrs) due to surgical blood loss or risk of bleeding: not applicable

## 2024-10-11 NOTE — Interval H&P Note (Signed)
 History and Physical Interval Note:  10/11/2024 7:54 AM  Edward Berger  has presented today for surgery, with the diagnosis of LEFT INGUINAL HERNIA.  The various methods of treatment have been discussed with the patient and family. After consideration of risks, benefits and other options for treatment, the patient has consented to  Procedures with comments: REPAIR, HERNIA, INGUINAL, ADULT (Left) - LEFT INGUINAL HERNIA REPAIR WITH MESH as a surgical intervention.  The patient's history has been reviewed, patient examined, no change in status, stable for surgery.  I have reviewed the patient's chart and labs.  Questions were answered to the patient's satisfaction.     Deward Null III

## 2024-10-11 NOTE — Anesthesia Procedure Notes (Signed)
 Procedure Name: Intubation Date/Time: 10/11/2024 8:58 AM  Performed by: Abriel Hattery J, CRNAPre-anesthesia Checklist: Patient identified, Emergency Drugs available, Suction available and Patient being monitored Patient Re-evaluated:Patient Re-evaluated prior to induction Oxygen Delivery Method: Circle System Utilized Preoxygenation: Pre-oxygenation with 100% oxygen Induction Type: IV induction Ventilation: Mask ventilation without difficulty Laryngoscope Size: Miller and 3 Grade View: Grade I Tube type: Oral Tube size: 7.5 mm Number of attempts: 1 Airway Equipment and Method: Stylet and Oral airway Placement Confirmation: ETT inserted through vocal cords under direct vision, positive ETCO2 and breath sounds checked- equal and bilateral Secured at: 23 cm Tube secured with: Tape Dental Injury: Teeth and Oropharynx as per pre-operative assessment

## 2024-10-11 NOTE — H&P (Signed)
 " REFERRING PHYSICIAN: Theophilus Andrews, Jonna* PROVIDER: DEWARD GARNETTE NULL, MD MRN: [id number] DOB: [birth year] Subjective   Chief Complaint: New Consultation ( unlateral recurrent ing hernia w/o obst or gang)  History of Present Illness: Edward Berger is a 79 y.o. male who is seen today as an office consultation for evaluation of New Consultation ( unlateral recurrent ing hernia w/o obst or gang)  We are asked to see the patient in consultation by Dr. Jackquelyn Theophilus to evaluate him for a left inguinal hernia. The patient is a 79 year old white male who apparently felt a bulge in the left groin about 3 years ago. He came to our practice but was not scheduled for surgery. Since that time he feels as though the hernia may have gotten slightly larger. He denies any acute pain associated with it. He denies any nausea or vomiting. His appetite is good and his bowels move regularly. He is otherwise in good health and does not smoke.  Review of Systems: A complete review of systems was obtained from the patient. I have reviewed this information and discussed as appropriate with the patient. See HPI as well for other ROS.  ROS   Medical History: Past Medical History:  Diagnosis Date  Cervical radiculopathy  Sensorineural hearing loss  Spinal stenosis, cervical region  Spinal stenosis, lumbar region without neurogenic claudication  Squamous cell carcinoma of skin of face   Patient Active Problem List  Diagnosis  Left inguinal hernia   Past Surgical History:  Procedure Laterality Date  ankle surgery Right  COLONOSCOPY  nasal polyp removal  shoulder surgery Left    No Known Allergies  Current Outpatient Medications on File Prior to Visit  Medication Sig Dispense Refill  finasteride  (PROPECIA ) 1 mg tablet Take 1 mg by mouth once daily  ibuprofen (MOTRIN) 200 MG tablet Take 400 mg by mouth every 6 (six) hours as needed for Pain  multivitamin tablet Take 1 tablet by mouth once  daily  omega-3 fatty acids/fish oil (FISH OIL) 340-1,000 mg capsule Take 1 capsule by mouth once daily  rosuvastatin  (CRESTOR ) 5 MG tablet Take 5 mg by mouth once daily   No current facility-administered medications on file prior to visit.   Family History  Problem Relation Age of Onset  Heart disease Mother  Prostate cancer Father    Social History   Tobacco Use  Smoking Status Never  Smokeless Tobacco Never    Social History   Socioeconomic History  Marital status: Married  Tobacco Use  Smoking status: Never  Smokeless tobacco: Never  Vaping Use  Vaping status: Never Used  Substance and Sexual Activity  Alcohol use: Yes  Alcohol/week: 0.0 - 1.0 standard drinks of alcohol  Drug use: Never  Sexual activity: Defer   Social Drivers of Health   Financial Resource Strain: Low Risk (11/24/2023)  Received from [facility]  Overall Financial Resource Strain (CARDIA)  Difficulty of Paying Living Expenses: Not hard at all  Food Insecurity: No Food Insecurity (11/24/2023)  [facility]  Hunger Vital Sign  Within the past 12 months, you worried that your food would run out before you got the money to buy more.: Never true  Within the past 12 months, the food you bought just didn't last and you didn't have money to get more.: Never true  Transportation Needs: No Transportation Needs (11/24/2023)  Received from [facility]  Lack of Transportation (Medical): No  Lack of Transportation (Non-Medical): No  Physical Activity:  Sufficiently Active (11/24/2023)  Received from [facility]  Exercise Vital Sign  On average, how many days per week do you engage in moderate to strenuous exercise (like a brisk walk)?: 5 days  On average, how many minutes do you engage in exercise at this level?: 60 min  Stress: No Stress Concern Present (11/24/2023)  Received from [facility] - Occupational Stress Questionnaire   Feeling of Stress : Not at all  Social Connections: Unknown (11/24/2023)  Received from [facility]  Social Connection and Isolation Panel  In a typical week, how many times do you talk on the phone with family, friends, or neighbors?: Once a week  How often do you get together with friends or relatives?: Twice a week  How often do you attend church or religious services?: Patient declined  Do you belong to any clubs or organizations such as church groups, unions, fraternal or athletic groups, or school groups?: No  Are you married, widowed, divorced, separated, never married, or living with a partner?: Married  Housing Stability: Unknown (05/05/2024)  Housing Stability Vital Sign  Homeless in the Last Year: No   Objective:   Vitals:  BP: (!) 159/78  Pulse: 69  Temp: 36.5 C (97.7 F)  TempSrc: Temporal  SpO2: 98%  Weight: 72.3 kg (159 lb 6.4 oz)  Height: 172.7 cm (5' 8)  PainSc: 0-No pain   Body mass index is 24.24 kg/m.  Physical Exam Constitutional:  General: He is not in acute distress. Appearance: Normal appearance.  HENT:  Head: Normocephalic and atraumatic.  Right Ear: External ear normal.  Left Ear: External ear normal.  Nose: Nose normal.  Mouth/Throat:  Mouth: Mucous membranes are moist.  Pharynx: Oropharynx is clear.  Eyes:  General: No scleral icterus. Extraocular Movements: Extraocular movements intact.  Conjunctiva/sclera: Conjunctivae normal.  Pupils: Pupils are equal, round, and reactive to light.  Cardiovascular:  Rate and Rhythm: Normal rate and regular rhythm.  Pulses: Normal pulses.  Heart sounds: Normal heart sounds.  Pulmonary:  Effort: Pulmonary effort is normal. No respiratory distress.  Breath sounds: Normal breath sounds.  Abdominal:  General: Abdomen is flat. Bowel sounds are normal. There is no distension.  Palpations: Abdomen is soft.  Tenderness: There is no abdominal tenderness.  Genitourinary: Comments: There is a small  reducible bulge in the left groin. There is no palpable bulge or impulse with straining in the right groin Musculoskeletal:  General: No swelling or deformity. Normal range of motion.  Cervical back: Normal range of motion and neck supple. No tenderness.  Skin: General: Skin is warm and dry.  Coloration: Skin is not jaundiced.  Neurological:  General: No focal deficit present.  Mental Status: He is alert and oriented to person, place, and time.  Psychiatric:  Mood and Affect: Mood normal.  Behavior: Behavior normal.     Labs, Imaging and Diagnostic Testing:  Assessment and Plan:   Diagnoses and all orders for this visit:  Left inguinal hernia - [facility] Case Posting Request; Future   The patient appears to have a small mildly symptomatic left inguinal hernia. Because of the risk of incarceration and strangulation I feel he would benefit from having this fixed. He would also like to have this done. I have discussed with him in detail the risks and benefits of the operation to fix the hernia as well as some of the technical aspects including the use of mesh and the risk of chronic pain and he understands  and wishes to proceed. We will move forward with surgical scheduling  "

## 2024-10-12 ENCOUNTER — Encounter (HOSPITAL_COMMUNITY): Payer: Self-pay | Admitting: General Surgery

## 2024-11-11 ENCOUNTER — Ambulatory Visit: Admitting: Family Medicine

## 2024-12-06 ENCOUNTER — Ambulatory Visit
